# Patient Record
Sex: Male | Born: 1940 | Race: White | Hispanic: No | Marital: Married | Smoking: Never smoker
Health system: Southern US, Community
[De-identification: ages and names within clinical notes are randomized; demographics above are authoritative.]

## PROBLEM LIST (undated history)

## (undated) DIAGNOSIS — E119 Type 2 diabetes mellitus without complications: Secondary | ICD-10-CM

## (undated) DIAGNOSIS — F0390 Unspecified dementia without behavioral disturbance: Secondary | ICD-10-CM

## (undated) DIAGNOSIS — I10 Essential (primary) hypertension: Secondary | ICD-10-CM

---

## 2013-09-14 DEATH — deceased

## 2014-10-31 ENCOUNTER — Inpatient Hospital Stay: Payer: Self-pay | Admitting: Internal Medicine

## 2014-11-20 LAB — CULTURE, BLOOD (SINGLE)

## 2014-12-13 NOTE — Discharge Summary (Signed)
PATIENT NAME:  Luke Chen, Stephan MR#:  [id number] DATE OF BIRTH:  23-Jun-1941  DATE OF ADMISSION:  10/31/2014 DATE OF DISCHARGE:  11/01/2014  DISCHARGE DIAGNOSIS:  Pneumonia  ____________________________ Katha HammingSnehalatha Krystianna Soth, MD sk:AT D: 11/02/2014 18:23:28 ET T: 11/03/2014 03:50:15 ET JOB#: [id number]  cc: Katha HammingSnehalatha Noele Icenhour, MD, <Dictator> Katha HammingSNEHALATHA Maitland Muhlbauer MD ELECTRONICALLY SIGNED 11/05/2014 12:25

## 2014-12-13 NOTE — Discharge Summary (Signed)
PATIENT NAME:  Luke Chen, Luke Chen MR#:  [id number] DATE OF BIRTH:  19-Jan-1941  DATE OF ADMISSION:  10/31/2014 DATE OF DISCHARGE:  11/01/2014  DISCHARGE DIAGNOSES:  1.  Pneumonia.  2.  Fever and generalized weakness secondary to pneumonia.  3.  Type 2 diabetes mellitus. 4.  Hyperglycemia.   DISCHARGE MEDICATIONS:  1.  Levaquin 750 mg p.o. daily for 6 days. 2.  Metformin 500 mg p.o. b.i.d. 3.  Seroquel 25 mg p.o. daily.   CONSULTATIONS: None.   HOSPITAL COURSE: Patient is a 74 year old male patient brought in because of confusion. The patient has a history of diabetes before. He has been having cough, cold, and temperature at home, and yesterday his temperature was elevated at 101.3 Fahrenheit. Because of his weakness, fever, chills, brought in by EMS to the ER.  Patient's chest CAT scan showed some bilateral pneumonia. Admitted to hospitalist service for that.  His flu test has been negative. He was started on IV Levaquin. The patient also received IV fluids. The patient feels much better today. He has been afebrile and his white count has been normal at 8.5. He never spiked a white count. His kidney function is normal and the patient's CT chest showed some multifocal ground glass opacities concerning for infection. The patient felt better and wanted to go home today.  He is discharged home with Levaquin.   PHYSICAL EXAMINATION: DISCHARGE VITAL SIGNS:  Temperature is 97.5, heart rate 92, blood pressure 133/69, saturations 95% on room air.  CARDIOVASCULAR: S1, S2.  LUNGS: Clear to auscultation. No wheeze. No rales.  ABDOMEN: Soft, nontender, nondistended. Bowel sounds present.   DISPOSITION:  The patient is stable for discharge.  Time spent on discharge preparation more than 30 minutes.    ____________________________ Katha HammingSnehalatha Zeric Baranowski, MD sk:sp D: 11/01/2014 13:21:15 ET T: 11/01/2014 14:19:16 ET JOB#: [id number]  cc: Katha HammingSnehalatha Beyonce Sawatzky, MD, <Dictator> Katha HammingSNEHALATHA Rasheem Figiel  MD ELECTRONICALLY SIGNED 11/05/2014 12:25

## 2014-12-13 NOTE — H&P (Signed)
PATIENT NAME:  Luke Chen, Luke Chen MR#:  [id number] DATE OF BIRTH:  [birth year]  DATE OF ADMISSION:  10/31/2014  PRIMARY CARE PHYSICIAN: Marcine Matar., MD  CHIEF COMPLAINT: Brought in with confusion.   HISTORY OF PRESENT ILLNESS: This is a 74 year old man whose wife had a virus and a cold, now he caught the cold for the last few days. He has not been eating or drinking. He has been having a dry cough, low-grade fever, some chills, chest pain with the cough. The ER physician had been working him up, did a CAT scan of the chest that showed bilateral pneumonia. Also, the patient had a low-grade temperature and he was tachycardic despite giving a few liters of fluid. They were trying to send him home but when they stood him up he fell right back into the bed with weakness. He has been complaining of weakness also. Hospitalist services were contacted for further evaluation.   PAST MEDICAL HISTORY: Dementia and diabetes.   PAST SURGICAL HISTORY: Hernia.   ALLERGIES: No known drug allergies.   MEDICATIONS AT HOME: Include metformin 500 mg twice a day.   SOCIAL HISTORY: No smoking. No alcohol. No drug use. Used to work as a Advice worker. He lives with his wife.   FAMILY HISTORY: Father, unknown medical history. Mother died of a myocardial infarction.   REVIEW OF SYSTEMS:  CONSTITUTIONAL: Positive for fever. Positive for chills. Positive for sweats. Positive for weakness. No weight loss. No weight gain.  EYES: He does wear glasses.  EARS, NOSE, MOUTH, THROAT: No hearing loss. No sore throat. No difficulty swallowing.  CARDIOVASCULAR: Positive for chest pain with coughing.  RESPIRATORY: No shortness of breath. Positive for cough. No sputum. No hemoptysis.  GASTROINTESTINAL: No nausea. No vomiting. No abdominal pain. No diarrhea or constipation. No bright red blood per rectum. No melena.  GENITOURINARY: No burning on urination or hematuria.  MUSCULOSKELETAL: No joint pain or muscle pain.   INTEGUMENT: No rashes or eruptions.  NEUROLOGIC: No fainting or blackouts.  PSYCHIATRIC: No anxiety or depression.  ENDOCRINE: No thyroid problems.  HEMATOLOGIC AND LYMPHATIC: No anemia, no easy bruising or bleeding.   PHYSICAL EXAMINATION:  VITAL SIGNS: Temperature 100.2, pulse 105, respirations 22, blood pressure 145/80, pulse oximetry 98% on room air.  GENERAL: No respiratory distress.  EYES: Conjunctivae and lids normal. Pupils equal, round, and reactive to light. Extraocular muscles intact. No nystagmus.  EARS, NOSE, MOUTH AND THROAT: Tympanic membranes no erythema. Nasal mucosa no erythema. Throat no erythema. No exudate seen. Lips and gums: No lesions.  NECK: No JVD. No bruits. No lymphadenopathy. No thyromegaly. No thyroid nodules palpated.  RESPIRATORY: Decreased breath sounds bilaterally. Positive rhonchi bilateral bases. No wheeze or rales heard.  CARDIOVASCULAR: S1 and S2 tachycardic. No gallops, rubs, or murmurs heard. Carotid upstroke 2+ bilaterally. No bruits. Dorsalis pedis pulses 1+ bilaterally. No edema of the lower extremity.  ABDOMEN: Soft, nontender. No organomegaly/splenomegaly. Normoactive bowel sounds. No masses felt.  LYMPHATIC: No lymph nodes in the neck.  MUSCULOSKELETAL: No clubbing, edema, cyanosis.  SKIN: No ulcers or lesions seen.  NEUROLOGIC: Cranial nerves II-XII grossly intact. Deep tendon reflexes 2+ bilateral lower extremity.  PSYCHIATRIC: The patient is alert, oriented to person.   LABORATORY AND RADIOLOGICAL DATA: Chemistry showed a glucose of 336, BUN 18, creatinine 1.07, sodium 134, potassium 4.1, chloride 99, CO2 of 21, calcium 8.8. Liver function tests normal range. White blood cell count 9.6, hemoglobin and hematocrit 15.9 and 48.1, platelet count of 156,000. Troponin  negative. Lactic acid 1.8. Urinalysis 2+ blood, 2+ ketones, greater than 500 mg/dL of glucose. CT scan of the head showed no evidence of acute intracranial abnormality, bilateral ethmoid  air cell opacification could reflect acute and/or chronic sinusitis. Chest x-ray showed right basilar opacity likely atelectasis. Influenza test negative. CT scan of the chest showed multifocal ground-glass and airspace opacities bilaterally, most likely representing infection. EKG sinus tachycardia at 105 beats per minute, Q waves septally.   ASSESSMENT AND PLAN:  1.  Sepsis, bilateral pneumonia with fever and tachycardia. Intravenous Levaquin given in the Emergency Room. We will give intravenous fluid hydration and nebulizer treatments.  2.  Acute encephalopathy, likely sundowning with dementia. We will give Seroquel at night and a sitter while here. Dementia can get worse here in the hospital.  3.  Diabetes, uncontrolled. We will put on sliding scale. Continue Glucophage and check a hemoglobin A1c in the a.m.  4.  Chest pain with coughing, likely secondary to the pneumonia. We will hold off on further cardiac testing at this time.   CODE STATUS: Needs to be clarified tomorrow with the wife. I think the patient was too confused answer the question at this point. The family member that is in the room did not want me to call the wife because she is also sick and she was unable to answer the question   TIME SPENT ON ADMISSION: 50 minutes.    ____________________________ Cloteal Isaacson J. Renae GlossWieting, MD rjw:bm D: 10/31/2014 21:49:34 ET Herschell Dimes: 10/31/2014 22:02:07 ET JOB#: [id number]  cc: Herschell Dimesichard J. Renae GlossWieting, MD, <Dictator> Marcine Matarharles W. Phillips Jr., MD Salley ScarletICHARD J Masako Overall MD ELECTRONICALLY SIGNED 11/01/2014 13:32

## 2015-12-17 DIAGNOSIS — R42 Dizziness and giddiness: Secondary | ICD-10-CM | POA: Diagnosis not present

## 2015-12-17 DIAGNOSIS — Z760 Encounter for issue of repeat prescription: Secondary | ICD-10-CM | POA: Diagnosis not present

## 2016-01-27 IMAGING — CR DG CHEST 1V PORT
1 series · 1 of 1 positions shown · non-contrast
Comparison: None.

CLINICAL DATA: Hyperglycemic

EXAM:
PORTABLE CHEST - 1 VIEW

[ap]
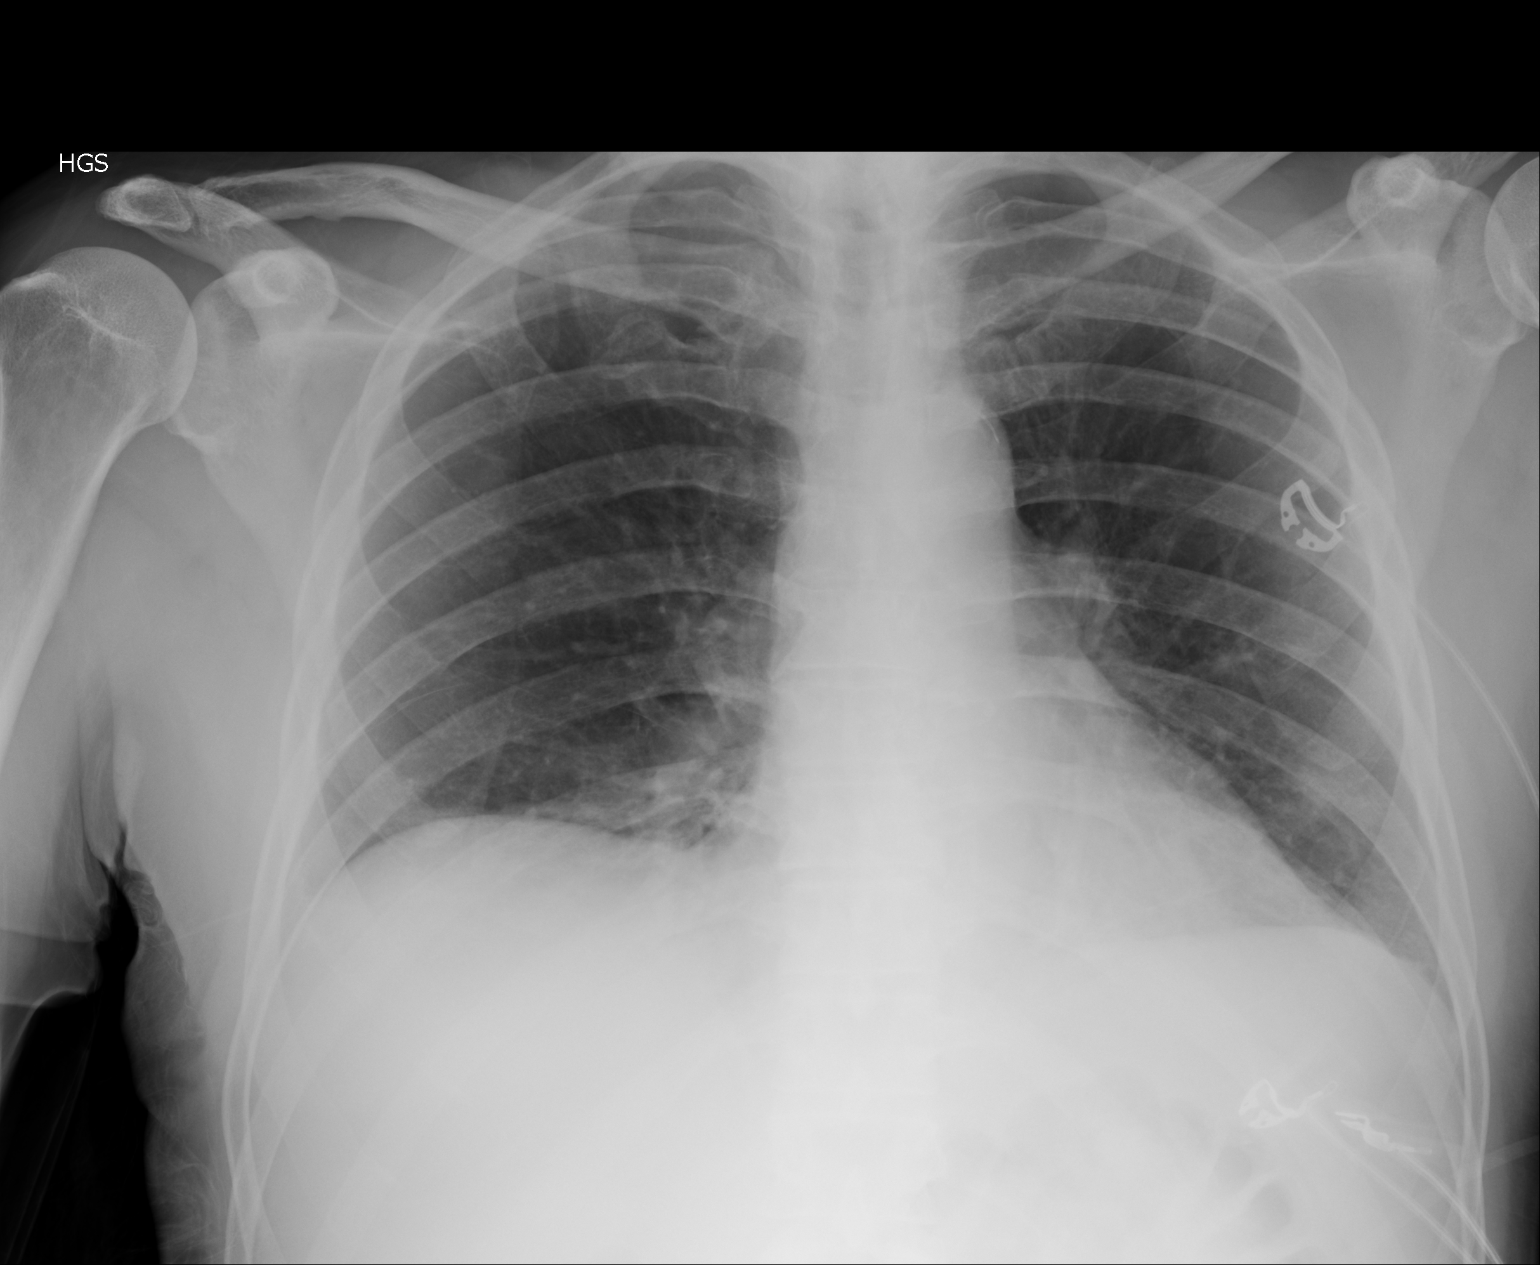

[1 of 1 positions shown; findings below may reference images not displayed]

FINDINGS: Lungs are essentially clear. Mild right basilar opacity, likely
atelectasis. No pleural effusion or pneumothorax.

The heart is normal in size.
IMPRESSION: Mild right basilar opacity, likely atelectasis.

## 2016-01-27 IMAGING — CT CT HEAD WITHOUT CONTRAST
1 series · 16 of 30 positions shown, 20 images · non-contrast
Comparison: None.

CLINICAL DATA: Altered level of consciousness, sluggish pupils.

EXAM:
CT HEAD WITHOUT CONTRAST
TECHNIQUE: Contiguous axial images were obtained from the base of the skull
through the vertex without intravenous contrast.

[Series 2: head wo · axial · 0.43mm/px · z∈[+468,+594]mm · 16 of 32 slices shown, 20 images]
[im 2/32  brain]
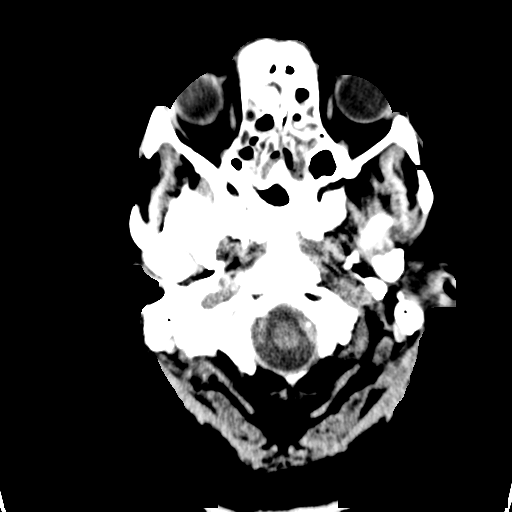
[im 2/32  bone]
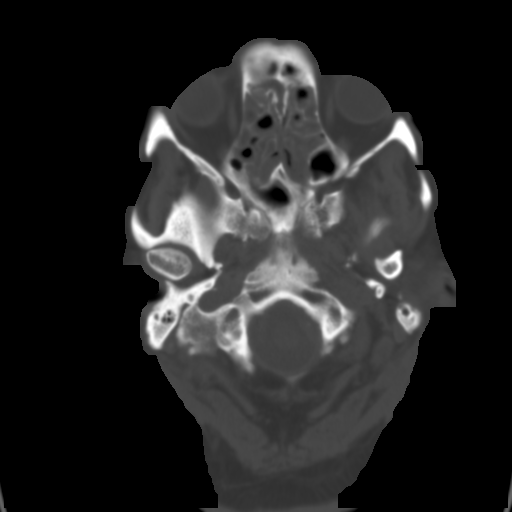
[im 4/32  brain]
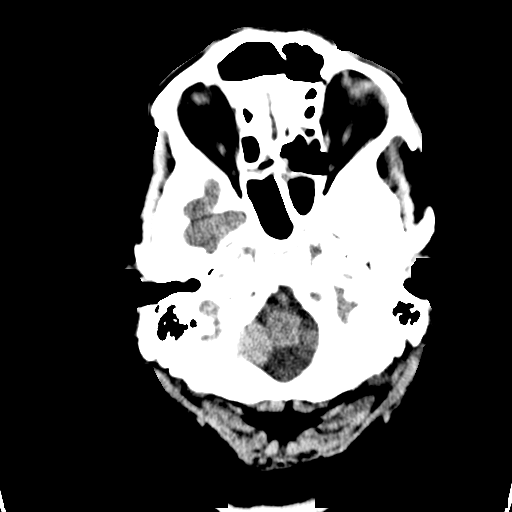
[im 6/32  brain]
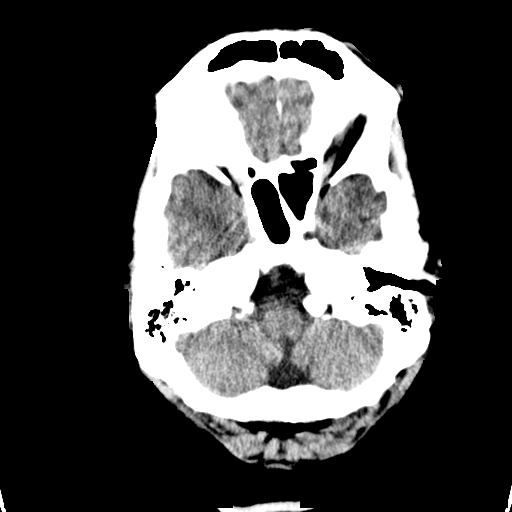
[im 8/32  brain]
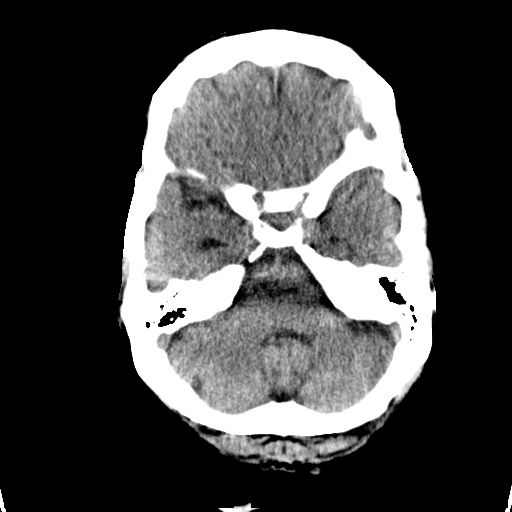
[im 9/32  brain]
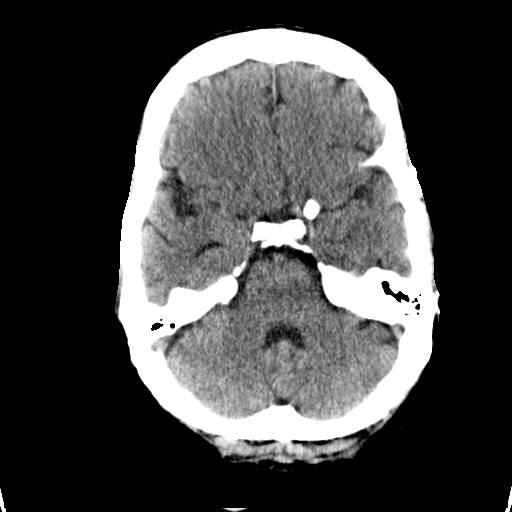
[im 9/32  bone]
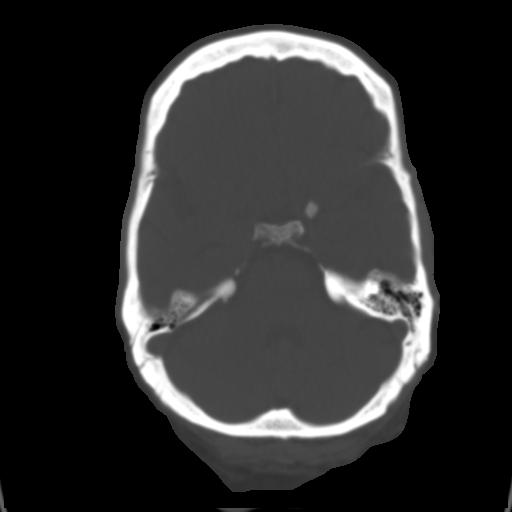
[im 11/32  brain]
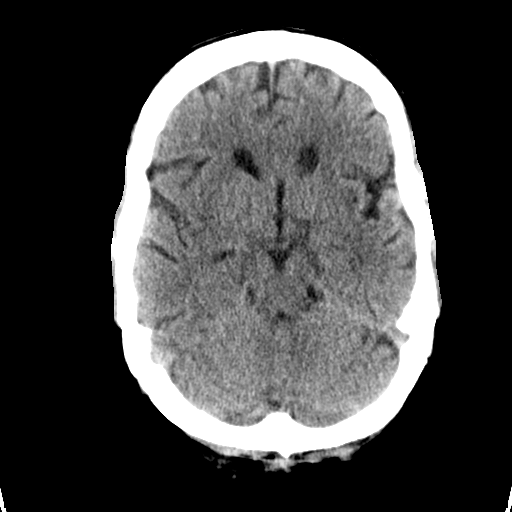
[im 13/32  brain]
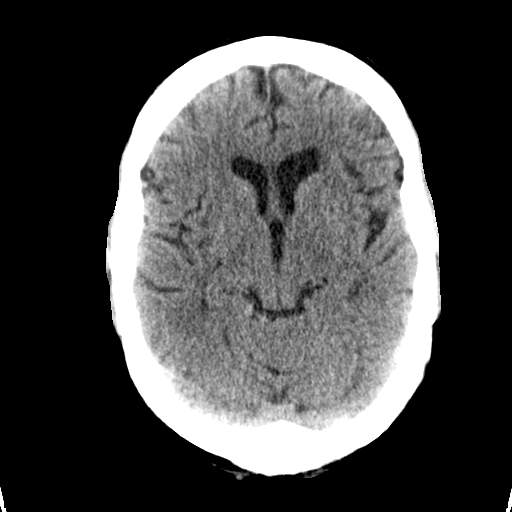
[im 15/32  brain]
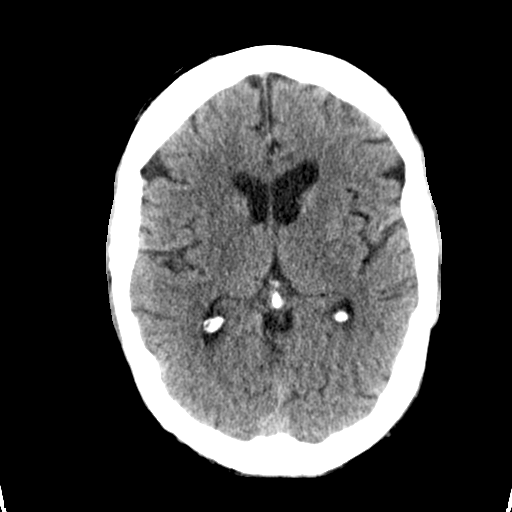
[im 17/32  brain]
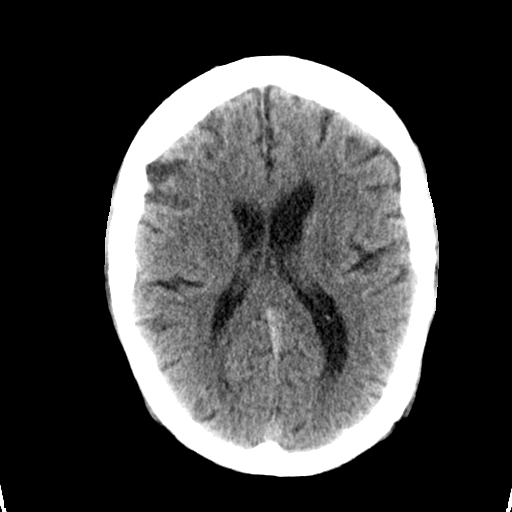
[im 17/32  bone]
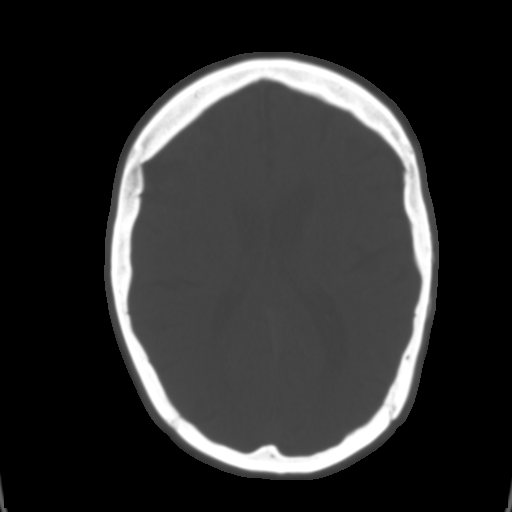
[im 19/32  brain]
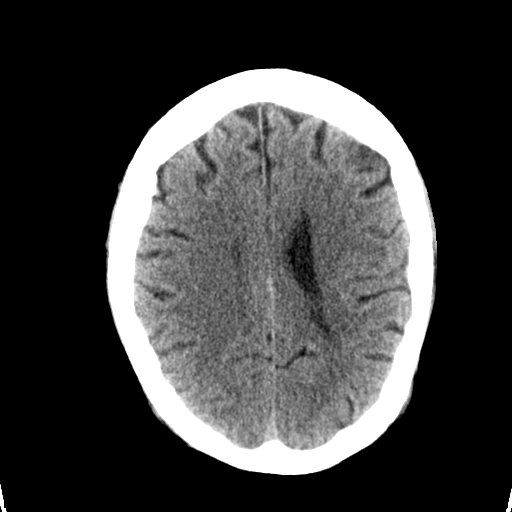
[im 21/32  brain]
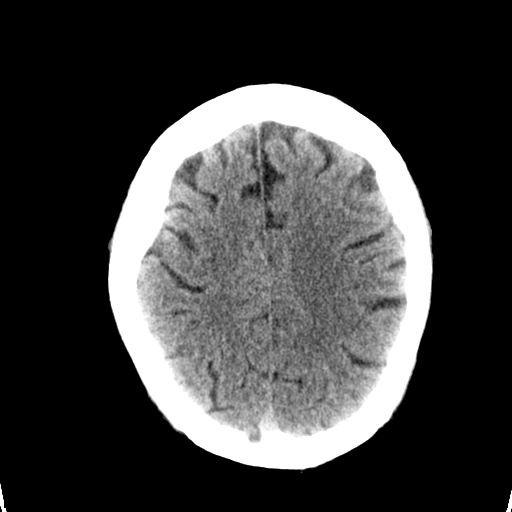
[im 23/32  brain]
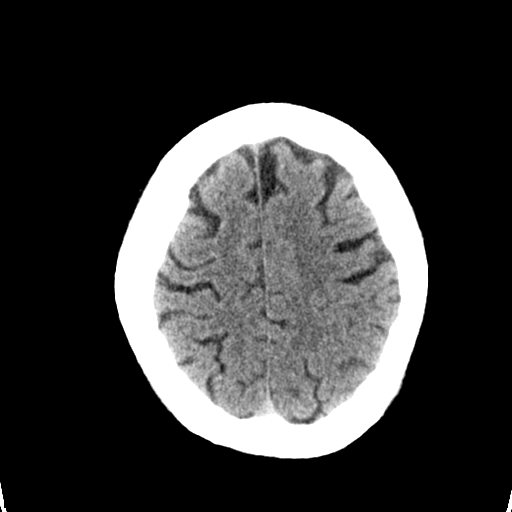
[im 24/32  brain]
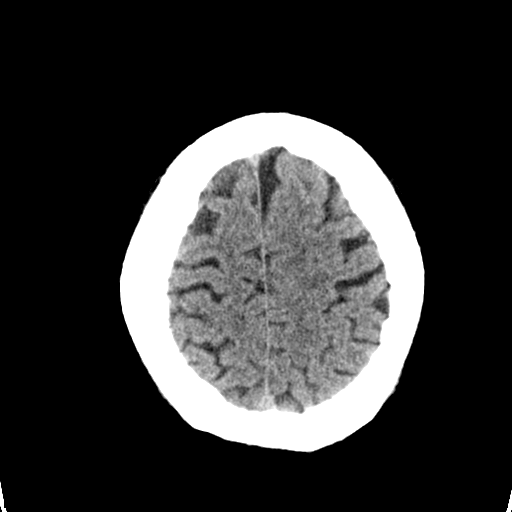
[im 24/32  bone]
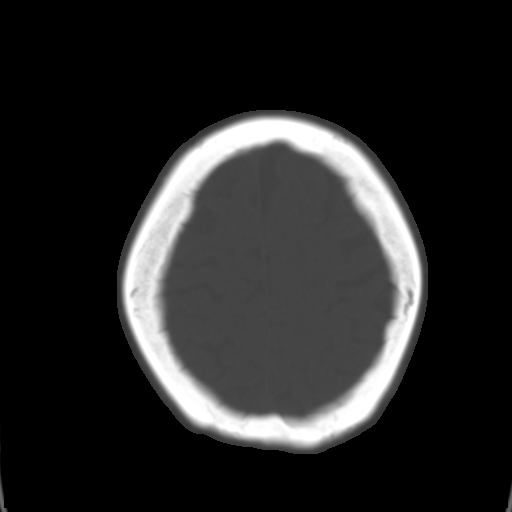
[im 26/32  brain]
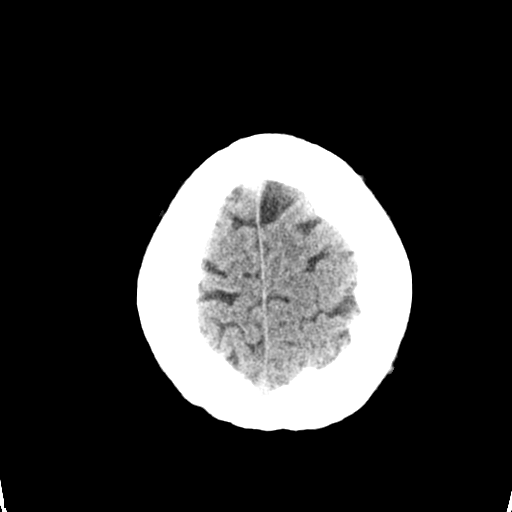
[im 28/32  brain]
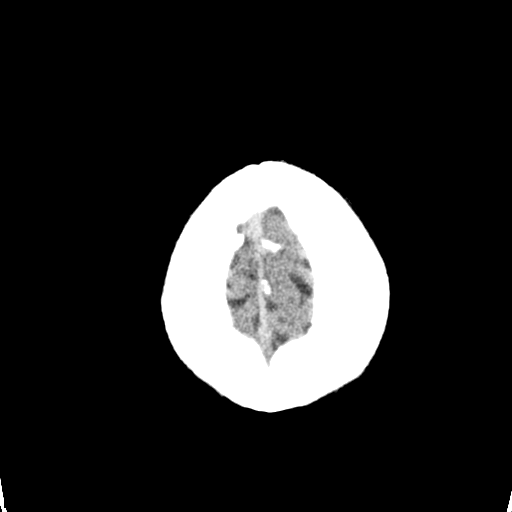
[im 30/32  brain]
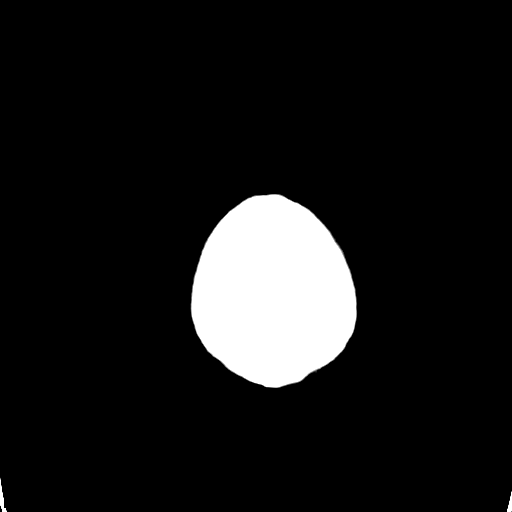

[16 of 30 positions shown; findings below may reference images not displayed]

FINDINGS: Mild subcortical and periventricular white matter hypodensities, a
nonspecific finding most often seen in the setting of chronic
microangiopathic change. No CT evidence of an acute infarction. No
intraparenchymal hemorrhage, mass, mass effect, or abnormal
extra-axial fluid collection. The ventricles, cisterns, and sulci
are normal in size, shape, and position for age. Bilateral ethmoid
air cell opacification and mucosal thickening. Mucosal thickening of
the sphenoid chambers and frontal sinuses inferiorly.
IMPRESSION: Mild white matter changes as above. No CT evidence of an acute
intracranial abnormality.

Bilateral ethmoid air cell opacification reflect acute and/or
chronic sinusitis. Correlate for clinical symptoms.

## 2016-07-18 DIAGNOSIS — E119 Type 2 diabetes mellitus without complications: Secondary | ICD-10-CM | POA: Diagnosis not present

## 2016-09-15 DIAGNOSIS — G309 Alzheimer's disease, unspecified: Secondary | ICD-10-CM | POA: Diagnosis not present

## 2016-09-15 DIAGNOSIS — F0280 Dementia in other diseases classified elsewhere without behavioral disturbance: Secondary | ICD-10-CM | POA: Diagnosis not present

## 2016-09-15 DIAGNOSIS — E119 Type 2 diabetes mellitus without complications: Secondary | ICD-10-CM | POA: Diagnosis not present

## 2016-09-15 DIAGNOSIS — J219 Acute bronchiolitis, unspecified: Secondary | ICD-10-CM | POA: Diagnosis not present

## 2016-09-29 DIAGNOSIS — E1169 Type 2 diabetes mellitus with other specified complication: Secondary | ICD-10-CM | POA: Diagnosis not present

## 2016-09-29 DIAGNOSIS — F0280 Dementia in other diseases classified elsewhere without behavioral disturbance: Secondary | ICD-10-CM | POA: Diagnosis not present

## 2016-09-29 DIAGNOSIS — G309 Alzheimer's disease, unspecified: Secondary | ICD-10-CM | POA: Diagnosis not present

## 2016-09-29 DIAGNOSIS — J219 Acute bronchiolitis, unspecified: Secondary | ICD-10-CM | POA: Diagnosis not present

## 2016-10-17 DIAGNOSIS — Z125 Encounter for screening for malignant neoplasm of prostate: Secondary | ICD-10-CM | POA: Diagnosis not present

## 2016-10-17 DIAGNOSIS — R5381 Other malaise: Secondary | ICD-10-CM | POA: Diagnosis not present

## 2016-10-17 DIAGNOSIS — E784 Other hyperlipidemia: Secondary | ICD-10-CM | POA: Diagnosis not present

## 2016-10-17 DIAGNOSIS — E119 Type 2 diabetes mellitus without complications: Secondary | ICD-10-CM | POA: Diagnosis not present

## 2016-10-17 DIAGNOSIS — I10 Essential (primary) hypertension: Secondary | ICD-10-CM | POA: Diagnosis not present

## 2016-10-23 DIAGNOSIS — F0280 Dementia in other diseases classified elsewhere without behavioral disturbance: Secondary | ICD-10-CM | POA: Diagnosis not present

## 2016-10-23 DIAGNOSIS — J219 Acute bronchiolitis, unspecified: Secondary | ICD-10-CM | POA: Diagnosis not present

## 2016-10-23 DIAGNOSIS — G309 Alzheimer's disease, unspecified: Secondary | ICD-10-CM | POA: Diagnosis not present

## 2016-10-23 DIAGNOSIS — E1169 Type 2 diabetes mellitus with other specified complication: Secondary | ICD-10-CM | POA: Diagnosis not present

## 2016-12-25 DIAGNOSIS — G309 Alzheimer's disease, unspecified: Secondary | ICD-10-CM | POA: Diagnosis not present

## 2016-12-25 DIAGNOSIS — F0280 Dementia in other diseases classified elsewhere without behavioral disturbance: Secondary | ICD-10-CM | POA: Diagnosis not present

## 2016-12-25 DIAGNOSIS — E119 Type 2 diabetes mellitus without complications: Secondary | ICD-10-CM | POA: Diagnosis not present

## 2016-12-25 DIAGNOSIS — J219 Acute bronchiolitis, unspecified: Secondary | ICD-10-CM | POA: Diagnosis not present

## 2017-04-05 DIAGNOSIS — E889 Metabolic disorder, unspecified: Secondary | ICD-10-CM | POA: Diagnosis not present

## 2017-04-05 DIAGNOSIS — E119 Type 2 diabetes mellitus without complications: Secondary | ICD-10-CM | POA: Diagnosis not present

## 2017-04-05 DIAGNOSIS — E1169 Type 2 diabetes mellitus with other specified complication: Secondary | ICD-10-CM | POA: Diagnosis not present

## 2017-04-05 DIAGNOSIS — G309 Alzheimer's disease, unspecified: Secondary | ICD-10-CM | POA: Diagnosis not present

## 2017-05-16 ENCOUNTER — Encounter: Payer: Self-pay | Admitting: Emergency Medicine

## 2017-05-16 ENCOUNTER — Emergency Department: Payer: Medicare Other

## 2017-05-16 ENCOUNTER — Emergency Department
Admission: EM | Admit: 2017-05-16 | Discharge: 2017-05-16 | Disposition: A | Discharge status: Home / Self Care | Payer: Medicare Other | Attending: Emergency Medicine | Admitting: Emergency Medicine

## 2017-05-16 DIAGNOSIS — I10 Essential (primary) hypertension: Secondary | ICD-10-CM | POA: Insufficient documentation

## 2017-05-16 DIAGNOSIS — R112 Nausea with vomiting, unspecified: Secondary | ICD-10-CM | POA: Diagnosis not present

## 2017-05-16 DIAGNOSIS — F0390 Unspecified dementia without behavioral disturbance: Secondary | ICD-10-CM | POA: Diagnosis not present

## 2017-05-16 DIAGNOSIS — N200 Calculus of kidney: Secondary | ICD-10-CM | POA: Insufficient documentation

## 2017-05-16 DIAGNOSIS — R1031 Right lower quadrant pain: Secondary | ICD-10-CM | POA: Diagnosis present

## 2017-05-16 DIAGNOSIS — N201 Calculus of ureter: Secondary | ICD-10-CM | POA: Diagnosis not present

## 2017-05-16 DIAGNOSIS — E119 Type 2 diabetes mellitus without complications: Secondary | ICD-10-CM | POA: Diagnosis not present

## 2017-05-16 DIAGNOSIS — R001 Bradycardia, unspecified: Secondary | ICD-10-CM | POA: Diagnosis not present

## 2017-05-16 HISTORY — DX: Essential (primary) hypertension: I10

## 2017-05-16 HISTORY — DX: Unspecified dementia, unspecified severity, without behavioral disturbance, psychotic disturbance, mood disturbance, and anxiety: F03.90

## 2017-05-16 HISTORY — DX: Type 2 diabetes mellitus without complications: E11.9

## 2017-05-16 LAB — COMPREHENSIVE METABOLIC PANEL
ALT: 16 U/L — AB (ref 17–63)
AST: 23 U/L (ref 15–41)
Albumin: 4.4 g/dL (ref 3.5–5.0)
Alkaline Phosphatase: 59 U/L (ref 38–126)
Anion gap: 11 (ref 5–15)
BUN: 17 mg/dL (ref 6–20)
CHLORIDE: 99 mmol/L — AB (ref 101–111)
CO2: 27 mmol/L (ref 22–32)
CREATININE: 1.28 mg/dL — AB (ref 0.61–1.24)
Calcium: 9.1 mg/dL (ref 8.9–10.3)
GFR calc Af Amer: >60 mL/min (ref >60)
GFR calc non Af Amer: 54 mL/min — ABNORMAL LOW (ref >60)
Glucose, Bld: 204 mg/dL — ABNORMAL HIGH (ref 65–99)
POTASSIUM: 4 mmol/L (ref 3.5–5.1)
SODIUM: 137 mmol/L (ref 135–145)
Total Bilirubin: 1 mg/dL (ref 0.3–1.2)
Total Protein: 6.9 g/dL (ref 6.5–8.1)

## 2017-05-16 LAB — URINALYSIS, COMPLETE (UACMP) WITH MICROSCOPIC
SQUAMOUS EPITHELIAL / LPF: NONE SEEN
Specific Gravity, Urine: 1.031 — ABNORMAL HIGH (ref 1.005–1.030)

## 2017-05-16 LAB — CBC
HEMATOCRIT: 46.8 % (ref 40.0–52.0)
Hemoglobin: 15.8 g/dL (ref 13.0–18.0)
MCH: 30.9 pg (ref 26.0–34.0)
MCHC: 33.8 g/dL (ref 32.0–36.0)
MCV: 91.3 fL (ref 80.0–100.0)
PLATELETS: 185 10*3/uL (ref 150–440)
RBC: 5.12 MIL/uL (ref 4.40–5.90)
RDW: 13.7 % (ref 11.5–14.5)
WBC: 15 10*3/uL — AB (ref 3.8–10.6)

## 2017-05-16 LAB — LIPASE, BLOOD: LIPASE: 17 U/L (ref 11–51)

## 2017-05-16 MED ORDER — ACETAMINOPHEN 500 MG PO TABS
1000.0000 mg | ORAL_TABLET | Freq: Once | ORAL | Status: AC
  Administered 2017-05-16: 1000 mg via ORAL
  Filled 2017-05-16: qty 2

## 2017-05-16 MED ORDER — TAMSULOSIN HCL 0.4 MG PO CAPS: 0.4000 mg | ORAL_CAPSULE | Freq: Every day | ORAL | 0 refills | Status: AC

## 2017-05-16 MED ORDER — OXYCODONE-ACETAMINOPHEN 5-325 MG PO TABS: 1.0000 | ORAL_TABLET | Freq: Four times a day (QID) | ORAL | 0 refills | Status: AC | PRN

## 2017-05-16 MED ORDER — TAMSULOSIN HCL 0.4 MG PO CAPS
0.4000 mg | ORAL_CAPSULE | Freq: Once | ORAL | Status: AC
  Administered 2017-05-16: 0.4 mg via ORAL
  Filled 2017-05-16: qty 1

## 2017-05-16 MED ORDER — CEPHALEXIN 500 MG PO CAPS: 500.0000 mg | ORAL_CAPSULE | Freq: Three times a day (TID) | ORAL | 0 refills | Status: AC

## 2017-05-16 MED ORDER — ONDANSETRON 4 MG PO TBDP: 4.0000 mg | ORAL_TABLET | Freq: Three times a day (TID) | ORAL | 0 refills | Status: DC | PRN

## 2017-05-16 MED ORDER — CEPHALEXIN 500 MG PO CAPS
500.0000 mg | ORAL_CAPSULE | Freq: Once | ORAL | Status: AC
  Administered 2017-05-16: 500 mg via ORAL
  Filled 2017-05-16: qty 1

## 2017-05-16 NOTE — ED Triage Notes (Signed)
Pt brought over from [facility] for evaluation of right flank pain that radiates to abdomen that began last night. Reports vomited last night but none since. Pt wife reports he has history of kidney stone.

## 2017-05-16 NOTE — Discharge Instructions (Signed)
You have been seen in the Emergency Department (ED)  Today and was diagnosed with kidney stones. While the stone is traveling through the ureter, which is the tube that carries urine from the kidney to the bladder, you will probably feel pain. The pain may be mild or very severe. You may also have some blood in your urine. As soon as the stone reaches the bladder, any intense pain should go away. If a stone is too large to pass on its own, you may need a medical procedure to help you pass the stone.   As we have discussed, please drink plenty of fluids and use a urinary strainer to attempt to capture the stone.  Please make a follow up appointment with Urology in the next week by calling the number below and bring the stone with you.  Take one percocet every 6 hours for pain. Do not take tylenol while taking percocet. Please also take your prescribed flomax daily.   Follow-up with your doctor or return to the ER in 12-24 hours if your pain is not well controlled, if you develop pain or burning with urination, or if you develop a fever. Otherwise follow up in 3-5 days with your doctor.  When should you call for help?  Call your doctor now or seek immediate medical care if:  You cannot keep down fluids.  Your pain gets worse.  You have a fever or chills.  You have new or worse pain in your back just below your rib cage (the flank area).  You have new or more blood in your urine. You have pain or burning with urination You are unable to urinate You have abdominal pain  Watch closely for changes in your health, and be sure to contact your doctor if:  You do not get better as expected  How can you care for yourself at home?  Drink plenty of fluids, enough so that your urine is light yellow or clear like water. If you have kidney, heart, or liver disease and have to limit fluids, talk with your doctor before you increase the amount of fluids you drink.  Take pain medicines exactly as directed. Call  your doctor if you think you are having a problem with your medicine.  If the doctor gave you a prescription medicine for pain, take it as prescribed.  If you are not taking a prescription pain medicine, ask your doctor if you can take an over-the-counter medicine. Read and follow all instructions on the label. Your doctor may ask you to strain your urine so that you can collect your kidney stone when it passes. You can use a kitchen strainer or a tea strainer to catch the stone. Store it in a plastic bag until you see your doctor again.  Preventing future kidney stones  Some changes in your diet may help prevent kidney stones. Depending on the cause of your stones, your doctor may recommend that you:  Drink plenty of fluids, enough so that your urine is light yellow or clear like water. If you have kidney, heart, or liver disease and have to limit fluids, talk with your doctor before you increase the amount of fluids you drink.  Limit coffee, tea, and alcohol. Also avoid grapefruit juice.  Do not take more than the recommended daily dose of vitamins C and D.  Avoid antacids such as Gaviscon, Maalox, Mylanta, or Tums.  Limit the amount of salt (sodium) in your diet.  Eat a balanced diet that  is not too high in protein.  Limit foods that are high in a substance called oxalate, which can cause kidney stones. These foods include dark green vegetables, rhubarb, chocolate, wheat bran, nuts, cranberries, and beans.

## 2017-05-16 NOTE — ED Provider Notes (Signed)
[facility] Emergency Department Provider Note  ____________________________________________  Time seen: Approximately 6:36 PM  I have reviewed the triage vital signs and the nursing notes.   HISTORY  Chief Complaint Flank Pain and Abdominal Pain   HPI Wayland Baik is a 76 y.o. male with a history of dementia, diabetes, hypertension, kidney stones and presents for evaluation of right flank pain. Pain started yesterday afternoon, sharp, intermittent, severe at times, initially on the right flank now in his right lower abdomen, associated with nausea and several episodes of nonbloody nonbilious emesis. No dysuria, hematuria, fever, chills. Patient denies any prior abdominal surgeries other than umbilical hernia repair. Patient's current pain is 3 out of 10.no chest pain, no shortness of breath, no paresthesias or weakness of his extremities.  Past Medical History:  Diagnosis Date  . Dementia   . Diabetes mellitus without complication (HCC)   . Hypertension     There are no active problems to display for this patient.   No past surgical history on file.  Prior to Admission medications   Medication Sig Start Date End Date Taking? Authorizing Provider  cephALEXin (KEFLEX) 500 MG capsule Take 1 capsule (500 mg total) by mouth 3 (three) times daily. 05/16/17 05/23/17  Nita Sickle, MD  ondansetron (ZOFRAN ODT) 4 MG disintegrating tablet Take 1 tablet (4 mg total) by mouth every 8 (eight) hours as needed for nausea or vomiting. 05/16/17   Nita Sickle, MD  oxyCODONE-acetaminophen (ROXICET) 5-325 MG tablet Take 1 tablet by mouth every 6 (six) hours as needed. 05/16/17 05/16/18  Nita Sickle, MD  tamsulosin (FLOMAX) 0.4 MG CAPS capsule Take 1 capsule (0.4 mg total) by mouth daily. 05/16/17 05/23/17  Nita Sickle, MD    Allergies Patient has no known allergies.  No family history on file.  Social History Social History  Substance Use  Topics  . Smoking status: Not on file  . Smokeless tobacco: Not on file  . Alcohol use Not on file    Review of Systems  Constitutional: Negative for fever. Eyes: Negative for visual changes. ENT: Negative for sore throat. Neck: No neck pain  Cardiovascular: Negative for chest pain. Respiratory: Negative for shortness of breath. Gastrointestinal: Negative for abdominal pain, vomiting or diarrhea. Genitourinary: Negative for dysuria. + R flank pain Musculoskeletal: Negative for back pain. Skin: Negative for rash. Neurological: Negative for headaches, weakness or numbness. Psych: No SI or HI  ____________________________________________   PHYSICAL EXAM:  VITAL SIGNS: ED Triage Vitals  Enc Vitals Group     BP 05/16/17 1648 (!) 147/56     Pulse Rate 05/16/17 1648 64     Resp 05/16/17 1648 18     Temp 05/16/17 1648 98.1 F (36.7 C)     Temp Source 05/16/17 1648 Oral     SpO2 05/16/17 1648 97 %     Weight 05/16/17 1649 160 lb (72.6 kg)     Height 05/16/17 1649  (1.753 m)     Head Circumference --      Peak Flow --      Pain Score --      Pain Loc --      Pain Edu? --      Excl. in GC? --     Constitutional: Alert and oriented. Well appearing and in no apparent distress. HEENT:      Head: Normocephalic and atraumatic.         Eyes: Conjunctivae are normal. Sclera is non-icteric.  Mouth/Throat: Mucous membranes are moist.       Neck: Supple with no signs of meningismus. Cardiovascular: Regular rate and rhythm. No murmurs, gallops, or rubs. 2+ symmetrical distal pulses are present in all extremities. No JVD. Respiratory: Normal respiratory effort. Lungs are clear to auscultation bilaterally. No wheezes, crackles, or rhonchi.  Gastrointestinal: Soft, non tender, and non distended with positive bowel sounds. No rebound or guarding. Genitourinary: No CVA tenderness. Musculoskeletal: Nontender with normal range of motion in all extremities. No edema, cyanosis, or  erythema of extremities. Neurologic: Normal speech and language. Face is symmetric. Moving all extremities. No gross focal neurologic deficits are appreciated. Skin: Skin is warm, dry and intact. No rash noted. Psychiatric: Mood and affect are normal. Speech and behavior are normal.  ____________________________________________   LABS (all labs ordered are listed, but only abnormal results are displayed)  Labs Reviewed  COMPREHENSIVE METABOLIC PANEL - Abnormal; Notable for the following:       Result Value   Chloride 99 (*)    Glucose, Bld 204 (*)    Creatinine, Ser 1.28 (*)    ALT 16 (*)    GFR calc non Af Amer 54 (*)    All other components within normal limits  CBC - Abnormal; Notable for the following:    WBC 15.0 (*)    All other components within normal limits  URINALYSIS, COMPLETE (UACMP) WITH MICROSCOPIC - Abnormal; Notable for the following:    Color, Urine ORANGE (*)    Specific Gravity, Urine 1.031 (*)    Glucose, UA   (*)    Value: TEST NOT REPORTED DUE TO COLOR INTERFERENCE OF URINE PIGMENT   Hgb urine dipstick   (*)    Value: TEST NOT REPORTED DUE TO COLOR INTERFERENCE OF URINE PIGMENT   Bilirubin Urine   (*)    Value: TEST NOT REPORTED DUE TO COLOR INTERFERENCE OF URINE PIGMENT   Ketones, ur   (*)    Value: TEST NOT REPORTED DUE TO COLOR INTERFERENCE OF URINE PIGMENT   Protein, ur   (*)    Value: TEST NOT REPORTED DUE TO COLOR INTERFERENCE OF URINE PIGMENT   Nitrite   (*)    Value: TEST NOT REPORTED DUE TO COLOR INTERFERENCE OF URINE PIGMENT   Leukocytes, UA   (*)    Value: TEST NOT REPORTED DUE TO COLOR INTERFERENCE OF URINE PIGMENT   Bacteria, UA RARE (*)    All other components within normal limits  URINE CULTURE  LIPASE, BLOOD   ____________________________________________  EKG  ED ECG REPORT I, Nita Sickle, the attending physician, personally viewed and interpreted this ECG.  sinus bradycardia, rate of 59, normal intervals, normal axis, no  ST elevations or depressions, Q-wave on anterior leads. no prior for comparison. ____________________________________________  RADIOLOGY  CT renal:  Stable subcentimeter right lower lobe pulmonary nodule supporting benign etiology. Bibasilar ground-glass opacities have markedly improved supporting non neoplastic etiology such as an inflammatory process 3 mm proximal right ureteral calculus is associated with right hydronephrosis. Right nephrolithiasis. Left inguinal hernia contains adipose tissue. Aortic atherosclerosis. ____________________________________________   PROCEDURES  Procedure(s) performed: None Procedures Critical Care performed:  None ____________________________________________   INITIAL IMPRESSION / ASSESSMENT AND PLAN / ED COURSE   76 y.o. male with a history of dementia, diabetes, hypertension, kidney stones who presents for evaluation of right flank pain x 1 day associated with nausea and vomiting. Patient is extremely well appearing, no distress, is normal vital signs, he has no flank  tenderness, abdomen is soft with no tenderness, no pulsatile masses, strong distal pulses. EKG with no ischemic changes. CT renal protocol showing a 3 mm proximal right ureteral stone with right hydronephrosis. Patient's pain has completely resolved with the Tylenol. UA is limited due color interference but no WBCs and rare bacteria. Ucx pending. Since patient has leukocytosis, inconclusive UA and proximal stone will start patient on abx and send home on keflex. Close follow up with Urology. Recommended return precautions for dysuria, worsening pain, fever, nausea, and vomiting.      Pertinent labs & imaging results that were available during my care of the patient were reviewed by me and considered in my medical decision making (see chart for details).    ____________________________________________   FINAL CLINICAL IMPRESSION(S) / ED DIAGNOSES  Final diagnoses:  Kidney  stone      NEW MEDICATIONS STARTED DURING THIS VISIT:  New Prescriptions   CEPHALEXIN (KEFLEX) 500 MG CAPSULE    Take 1 capsule (500 mg total) by mouth 3 (three) times daily.   ONDANSETRON (ZOFRAN ODT) 4 MG DISINTEGRATING TABLET    Take 1 tablet (4 mg total) by mouth every 8 (eight) hours as needed for nausea or vomiting.   OXYCODONE-ACETAMINOPHEN (ROXICET) 5-325 MG TABLET    Take 1 tablet by mouth every 6 (six) hours as needed.   TAMSULOSIN (FLOMAX) 0.4 MG CAPS CAPSULE    Take 1 capsule (0.4 mg total) by mouth daily.     Note:  This document was prepared using Dragon voice recognition software and may include unintentional dictation errors.    Nita Sickle, MD 05/16/17 [id number]

## 2017-05-18 LAB — URINE CULTURE: Culture: NO GROWTH

## 2017-07-03 DIAGNOSIS — G309 Alzheimer's disease, unspecified: Secondary | ICD-10-CM | POA: Diagnosis not present

## 2017-07-03 DIAGNOSIS — E1169 Type 2 diabetes mellitus with other specified complication: Secondary | ICD-10-CM | POA: Diagnosis not present

## 2017-07-03 DIAGNOSIS — E889 Metabolic disorder, unspecified: Secondary | ICD-10-CM | POA: Diagnosis not present

## 2017-07-03 DIAGNOSIS — E119 Type 2 diabetes mellitus without complications: Secondary | ICD-10-CM | POA: Diagnosis not present

## 2017-09-26 DIAGNOSIS — R0789 Other chest pain: Secondary | ICD-10-CM | POA: Diagnosis not present

## 2017-09-26 DIAGNOSIS — R21 Rash and other nonspecific skin eruption: Secondary | ICD-10-CM | POA: Diagnosis not present

## 2017-09-26 DIAGNOSIS — R000 Tachycardia, unspecified: Secondary | ICD-10-CM | POA: Diagnosis not present

## 2017-09-26 DIAGNOSIS — R531 Weakness: Secondary | ICD-10-CM | POA: Diagnosis not present

## 2017-09-26 DIAGNOSIS — R079 Chest pain, unspecified: Secondary | ICD-10-CM | POA: Diagnosis not present

## 2017-09-27 DIAGNOSIS — R21 Rash and other nonspecific skin eruption: Secondary | ICD-10-CM | POA: Diagnosis not present

## 2017-09-27 DIAGNOSIS — R0789 Other chest pain: Secondary | ICD-10-CM | POA: Diagnosis not present

## 2017-10-30 DIAGNOSIS — F0280 Dementia in other diseases classified elsewhere without behavioral disturbance: Secondary | ICD-10-CM | POA: Diagnosis not present

## 2017-10-30 DIAGNOSIS — B0223 Postherpetic polyneuropathy: Secondary | ICD-10-CM | POA: Diagnosis not present

## 2017-10-30 DIAGNOSIS — E119 Type 2 diabetes mellitus without complications: Secondary | ICD-10-CM | POA: Diagnosis not present

## 2017-10-30 DIAGNOSIS — G309 Alzheimer's disease, unspecified: Secondary | ICD-10-CM | POA: Diagnosis not present

## 2017-11-29 DIAGNOSIS — G309 Alzheimer's disease, unspecified: Secondary | ICD-10-CM | POA: Diagnosis not present

## 2017-11-29 DIAGNOSIS — F0280 Dementia in other diseases classified elsewhere without behavioral disturbance: Secondary | ICD-10-CM | POA: Diagnosis not present

## 2017-11-29 DIAGNOSIS — R2681 Unsteadiness on feet: Secondary | ICD-10-CM | POA: Diagnosis not present

## 2017-11-29 DIAGNOSIS — E119 Type 2 diabetes mellitus without complications: Secondary | ICD-10-CM | POA: Diagnosis not present

## 2018-01-03 DIAGNOSIS — E119 Type 2 diabetes mellitus without complications: Secondary | ICD-10-CM | POA: Diagnosis not present

## 2018-01-03 DIAGNOSIS — R2681 Unsteadiness on feet: Secondary | ICD-10-CM | POA: Diagnosis not present

## 2018-01-03 DIAGNOSIS — J219 Acute bronchiolitis, unspecified: Secondary | ICD-10-CM | POA: Diagnosis not present

## 2018-01-03 DIAGNOSIS — E1169 Type 2 diabetes mellitus with other specified complication: Secondary | ICD-10-CM | POA: Diagnosis not present

## 2018-01-04 DIAGNOSIS — R5381 Other malaise: Secondary | ICD-10-CM | POA: Diagnosis not present

## 2018-01-04 DIAGNOSIS — E119 Type 2 diabetes mellitus without complications: Secondary | ICD-10-CM | POA: Diagnosis not present

## 2018-01-04 DIAGNOSIS — Z125 Encounter for screening for malignant neoplasm of prostate: Secondary | ICD-10-CM | POA: Diagnosis not present

## 2018-01-04 DIAGNOSIS — I10 Essential (primary) hypertension: Secondary | ICD-10-CM | POA: Diagnosis not present

## 2018-01-04 DIAGNOSIS — D518 Other vitamin B12 deficiency anemias: Secondary | ICD-10-CM | POA: Diagnosis not present

## 2018-01-04 DIAGNOSIS — E7849 Other hyperlipidemia: Secondary | ICD-10-CM | POA: Diagnosis not present

## 2018-01-10 DIAGNOSIS — R2681 Unsteadiness on feet: Secondary | ICD-10-CM | POA: Diagnosis not present

## 2018-01-10 DIAGNOSIS — G309 Alzheimer's disease, unspecified: Secondary | ICD-10-CM | POA: Diagnosis not present

## 2018-01-10 DIAGNOSIS — F0280 Dementia in other diseases classified elsewhere without behavioral disturbance: Secondary | ICD-10-CM | POA: Diagnosis not present

## 2018-01-10 DIAGNOSIS — E119 Type 2 diabetes mellitus without complications: Secondary | ICD-10-CM | POA: Diagnosis not present

## 2018-03-13 DIAGNOSIS — E119 Type 2 diabetes mellitus without complications: Secondary | ICD-10-CM | POA: Diagnosis not present

## 2018-03-13 DIAGNOSIS — F0280 Dementia in other diseases classified elsewhere without behavioral disturbance: Secondary | ICD-10-CM | POA: Diagnosis not present

## 2018-03-13 DIAGNOSIS — G309 Alzheimer's disease, unspecified: Secondary | ICD-10-CM | POA: Diagnosis not present

## 2018-03-13 DIAGNOSIS — E889 Metabolic disorder, unspecified: Secondary | ICD-10-CM | POA: Diagnosis not present

## 2018-06-11 DIAGNOSIS — E889 Metabolic disorder, unspecified: Secondary | ICD-10-CM | POA: Diagnosis not present

## 2018-06-11 DIAGNOSIS — E119 Type 2 diabetes mellitus without complications: Secondary | ICD-10-CM | POA: Diagnosis not present

## 2018-06-11 DIAGNOSIS — G309 Alzheimer's disease, unspecified: Secondary | ICD-10-CM | POA: Diagnosis not present

## 2018-06-11 DIAGNOSIS — Z0000 Encounter for general adult medical examination without abnormal findings: Secondary | ICD-10-CM | POA: Diagnosis not present

## 2018-06-11 DIAGNOSIS — F0280 Dementia in other diseases classified elsewhere without behavioral disturbance: Secondary | ICD-10-CM | POA: Diagnosis not present

## 2018-08-12 IMAGING — CT CT RENAL STONE PROTOCOL
2 of 4 series · 16 of 46 positions shown, 18 images · non-contrast
Comparison: None.

CLINICAL DATA: Right flank pain since last night.

EXAM:
CT ABDOMEN AND PELVIS WITHOUT CONTRAST
TECHNIQUE: Multidetector CT imaging of the abdomen and pelvis was performed
following the standard protocol without IV contrast.

[Series 2: stone full standard · axial · 0.77mm/px · z∈[-476,-16]mm · 13 of 101 slices shown, 15 images]
[im 5/101  soft-tissue]
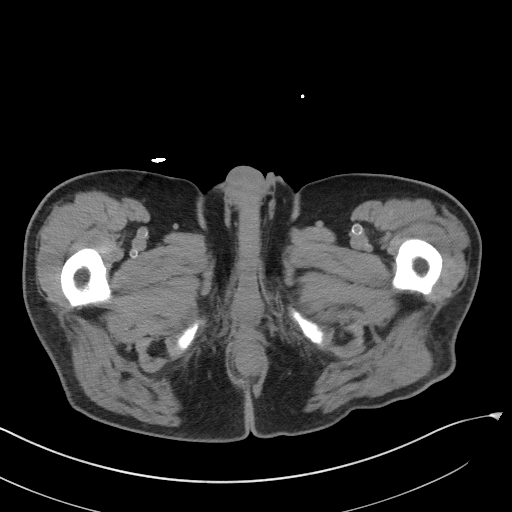
[im 5/101  bone]
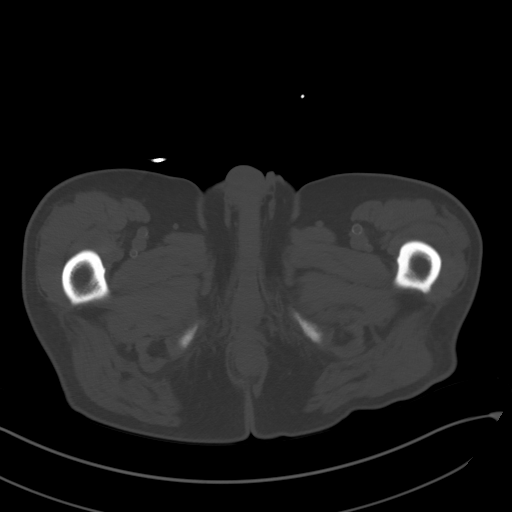
[im 13/101  soft-tissue]
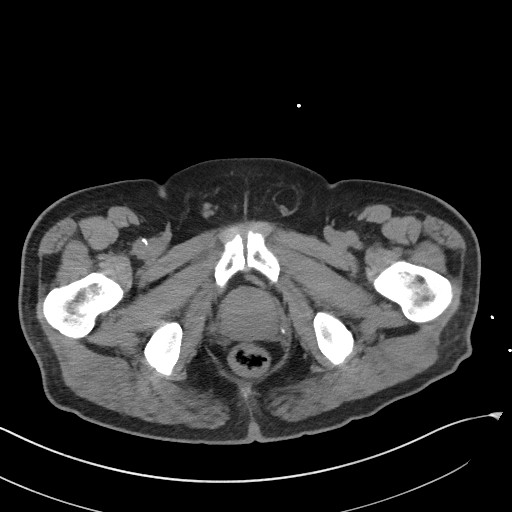
[im 21/101  soft-tissue]
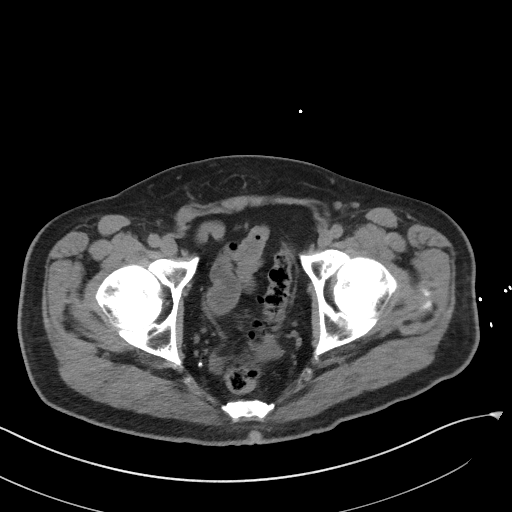
[im 29/101  soft-tissue]
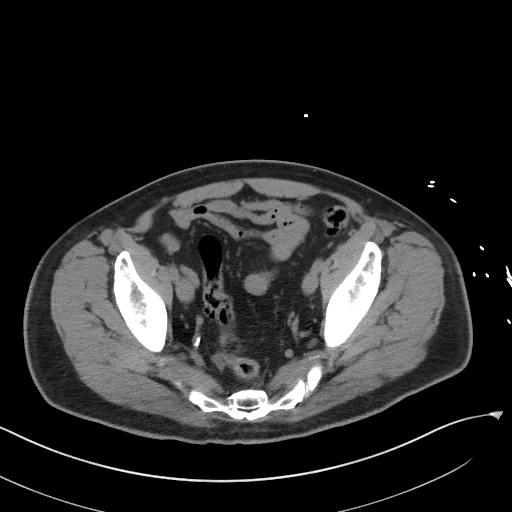
[im 37/101  soft-tissue]
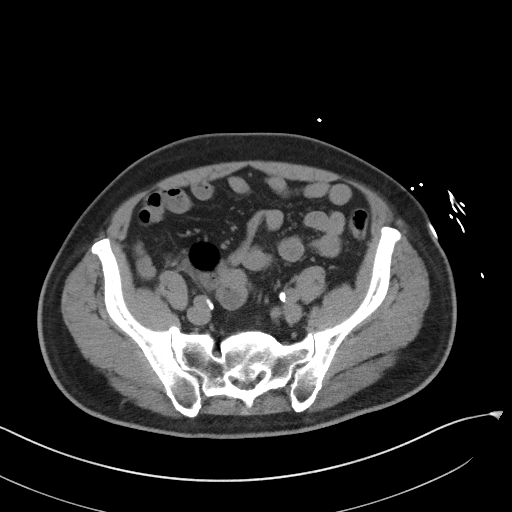
[im 45/101  soft-tissue]
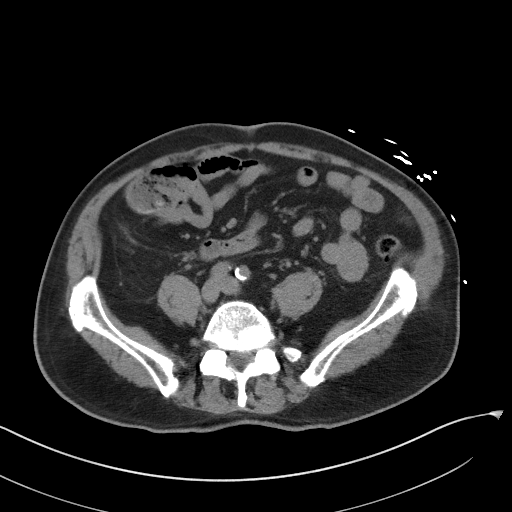
[im 53/101  soft-tissue]
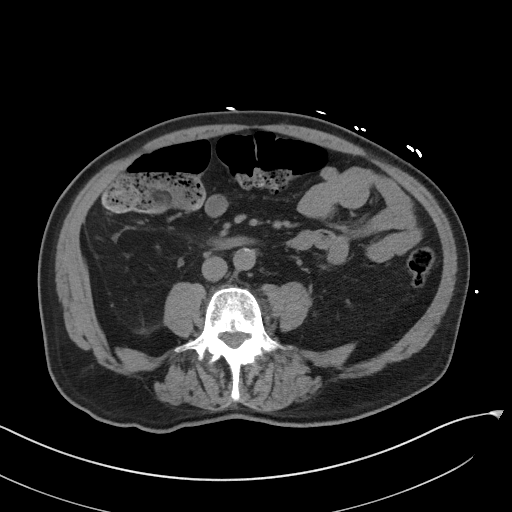
[im 57/101  soft-tissue]
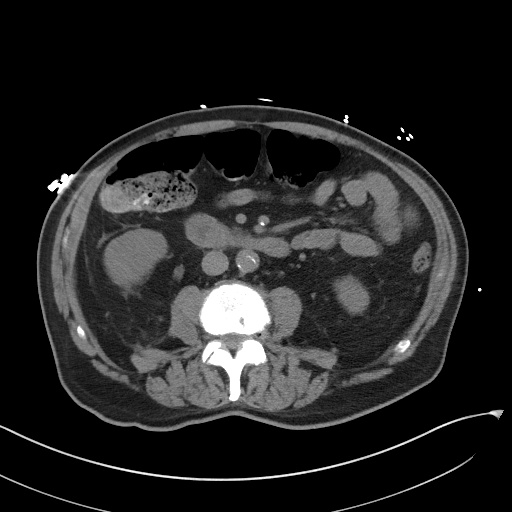
[im 65/101  soft-tissue]
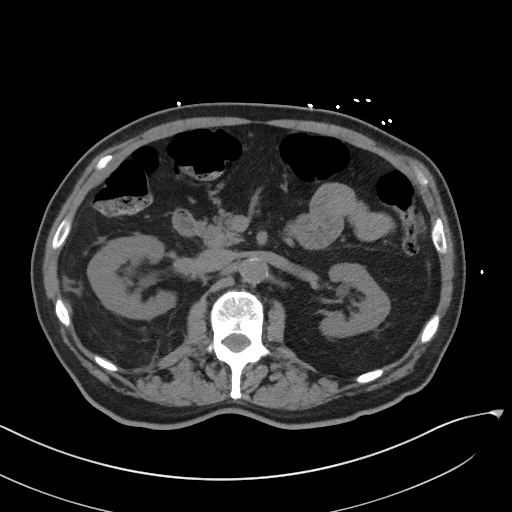
[im 65/101  bone]
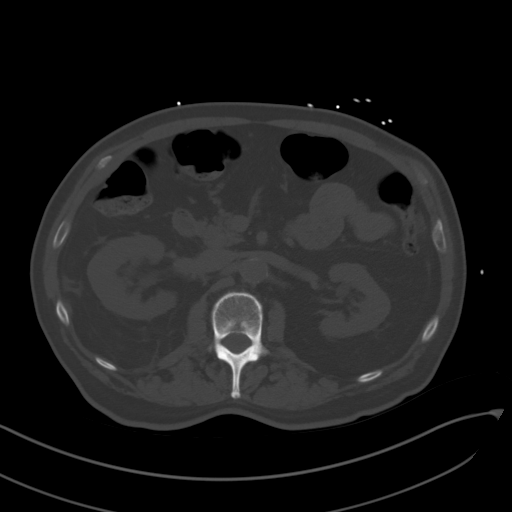
[im 73/101  soft-tissue]
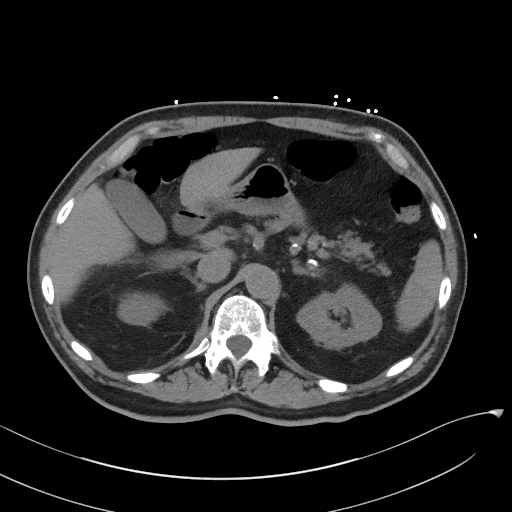
[im 81/101  soft-tissue]
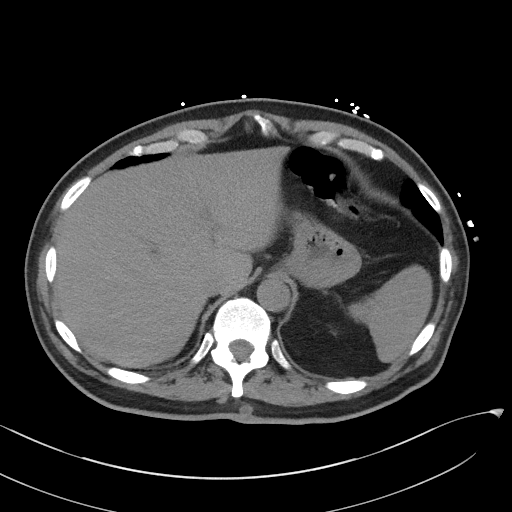
[im 89/101  soft-tissue]
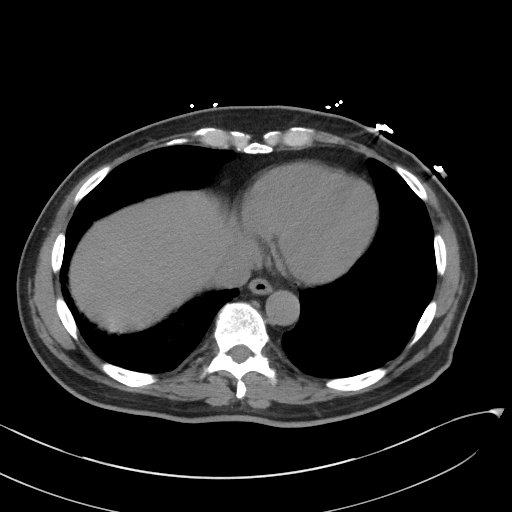
[im 97/101  soft-tissue]
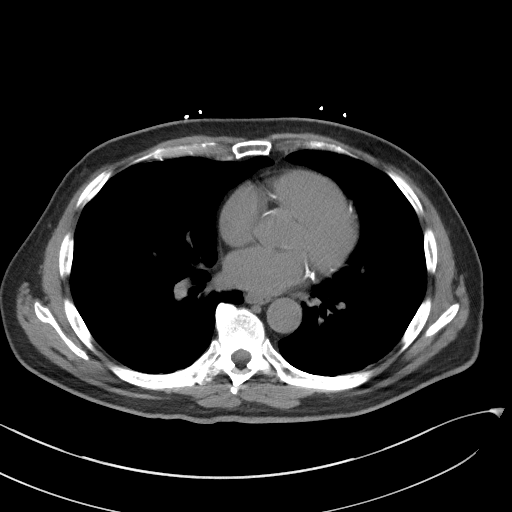

[Series 5: coronal · coronal · 0.84mm/px · 3 of 129 slices shown]
[im 43/129  soft-tissue]
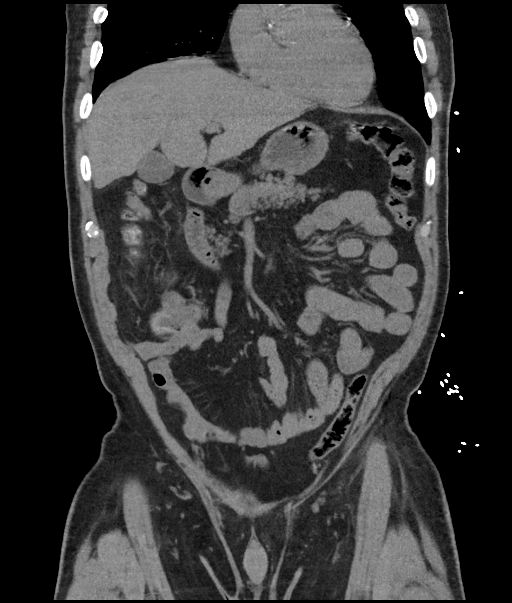
[im 57/129  soft-tissue]
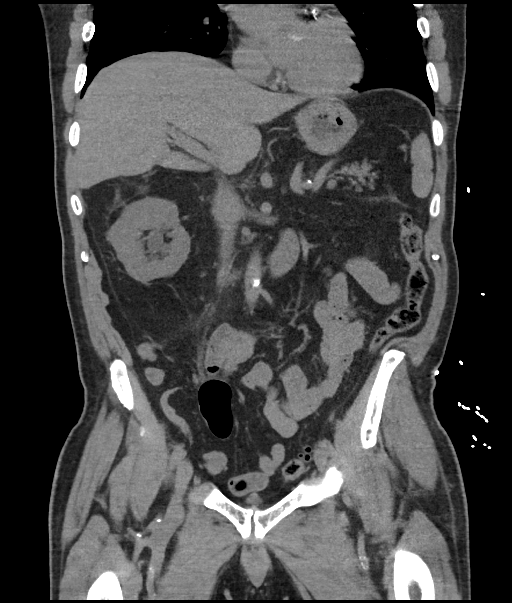
[im 72/129  soft-tissue]
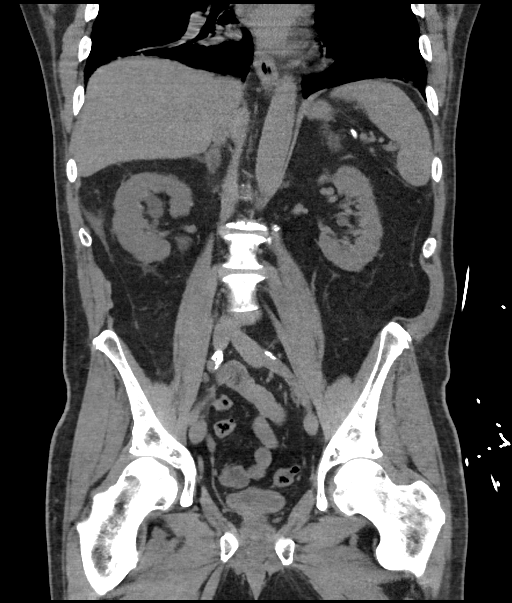

[16 of 46 positions shown; findings below may reference images not displayed]

FINDINGS: Lower chest: Dependent atelectasis. 6 mm nodule in the right lower
lobe is stable in retrospect. This occurs on image 8 of series 4 on
today's study and image 37 of series 3 on the prior study dated
10/31/2014. Previously visualized ground-glass opacities have
markedly improved.

Hepatobiliary: Liver and gallbladder are within normal limits.

Pancreas: Unremarkable

Spleen: Unremarkable

Adrenals/Urinary Tract: Adrenal glands are within normal limits.
Mild atrophy of the left kidney. Moderate right hydronephrosis. Tiny
calculus in the lower pole of the right kidney measures 1 mm. 3 mm
calculus in the proximal right ureter. There is stranding in the
retroperitoneal fat along the right ureter. Bladder is decompressed.

Stomach/Bowel: Stomach and duodenum are unremarkable. Diverticulosis
of the descending and sigmoid colon. No obvious mass in the colon.
No evidence of small-bowel obstruction.

Vascular/Lymphatic: No evidence of retroperitoneal adenopathy.
Atherosclerotic calcifications of the aorta and iliac artery's is
present.

Reproductive: Prostate gland is borderline enlarged.

Other: No free-fluid.  Left inguinal hernia contains adipose tissue.

Musculoskeletal: No vertebral compression deformity. Lumbar
degenerative disc disease.
IMPRESSION: Stable subcentimeter right lower lobe pulmonary nodule supporting
benign etiology.

Bibasilar ground-glass opacities have markedly improved supporting
non neoplastic etiology such as an inflammatory process

3 mm proximal right ureteral calculus is associated with right
hydronephrosis.

Right nephrolithiasis.

Left inguinal hernia contains adipose tissue.

Aortic atherosclerosis.

## 2019-01-17 DIAGNOSIS — L089 Local infection of the skin and subcutaneous tissue, unspecified: Secondary | ICD-10-CM | POA: Diagnosis not present

## 2019-01-17 DIAGNOSIS — E119 Type 2 diabetes mellitus without complications: Secondary | ICD-10-CM | POA: Diagnosis not present

## 2019-01-17 DIAGNOSIS — L723 Sebaceous cyst: Secondary | ICD-10-CM | POA: Diagnosis not present

## 2019-01-20 DIAGNOSIS — L720 Epidermal cyst: Secondary | ICD-10-CM | POA: Diagnosis not present

## 2019-01-27 DIAGNOSIS — L02212 Cutaneous abscess of back [any part, except buttock]: Secondary | ICD-10-CM | POA: Diagnosis not present

## 2019-02-05 ENCOUNTER — Other Ambulatory Visit: Payer: Self-pay

## 2019-02-05 ENCOUNTER — Encounter: Payer: Self-pay | Admitting: Emergency Medicine

## 2019-02-05 ENCOUNTER — Observation Stay
Admission: EM | Admit: 2019-02-05 | Discharge: 2019-02-06 | Disposition: A | Discharge status: Home / Self Care | Payer: Medicare Other | Attending: Internal Medicine | Admitting: Internal Medicine

## 2019-02-05 ENCOUNTER — Emergency Department: Payer: Medicare Other

## 2019-02-05 DIAGNOSIS — R410 Disorientation, unspecified: Secondary | ICD-10-CM | POA: Diagnosis not present

## 2019-02-05 DIAGNOSIS — S0101XA Laceration without foreign body of scalp, initial encounter: Principal | ICD-10-CM | POA: Insufficient documentation

## 2019-02-05 DIAGNOSIS — R778 Other specified abnormalities of plasma proteins: Secondary | ICD-10-CM | POA: Diagnosis present

## 2019-02-05 DIAGNOSIS — Z66 Do not resuscitate: Secondary | ICD-10-CM | POA: Diagnosis not present

## 2019-02-05 DIAGNOSIS — Z7982 Long term (current) use of aspirin: Secondary | ICD-10-CM | POA: Diagnosis not present

## 2019-02-05 DIAGNOSIS — R7989 Other specified abnormal findings of blood chemistry: Secondary | ICD-10-CM | POA: Diagnosis not present

## 2019-02-05 DIAGNOSIS — F0390 Unspecified dementia without behavioral disturbance: Secondary | ICD-10-CM | POA: Diagnosis not present

## 2019-02-05 DIAGNOSIS — Y92009 Unspecified place in unspecified non-institutional (private) residence as the place of occurrence of the external cause: Secondary | ICD-10-CM | POA: Diagnosis not present

## 2019-02-05 DIAGNOSIS — Z1159 Encounter for screening for other viral diseases: Secondary | ICD-10-CM | POA: Insufficient documentation

## 2019-02-05 DIAGNOSIS — S0990XA Unspecified injury of head, initial encounter: Secondary | ICD-10-CM

## 2019-02-05 DIAGNOSIS — I10 Essential (primary) hypertension: Secondary | ICD-10-CM | POA: Diagnosis not present

## 2019-02-05 DIAGNOSIS — Z794 Long term (current) use of insulin: Secondary | ICD-10-CM | POA: Diagnosis not present

## 2019-02-05 DIAGNOSIS — Z03818 Encounter for observation for suspected exposure to other biological agents ruled out: Secondary | ICD-10-CM | POA: Diagnosis not present

## 2019-02-05 DIAGNOSIS — E119 Type 2 diabetes mellitus without complications: Secondary | ICD-10-CM | POA: Insufficient documentation

## 2019-02-05 DIAGNOSIS — E876 Hypokalemia: Secondary | ICD-10-CM | POA: Diagnosis not present

## 2019-02-05 DIAGNOSIS — Z79899 Other long term (current) drug therapy: Secondary | ICD-10-CM | POA: Insufficient documentation

## 2019-02-05 DIAGNOSIS — W19XXXA Unspecified fall, initial encounter: Secondary | ICD-10-CM | POA: Diagnosis not present

## 2019-02-05 LAB — COMPREHENSIVE METABOLIC PANEL
ALT: 17 U/L (ref 0–44)
AST: 21 U/L (ref 15–41)
Albumin: 4.1 g/dL (ref 3.5–5.0)
Alkaline Phosphatase: 56 U/L (ref 38–126)
Anion gap: 12 (ref 5–15)
BUN: 15 mg/dL (ref 8–23)
CO2: 23 mmol/L (ref 22–32)
Calcium: 9 mg/dL (ref 8.9–10.3)
Chloride: 105 mmol/L (ref 98–111)
Creatinine, Ser: 0.77 mg/dL (ref 0.61–1.24)
GFR calc Af Amer: >60 mL/min (ref >60)
GFR calc non Af Amer: >60 mL/min (ref >60)
Glucose, Bld: 207 mg/dL — ABNORMAL HIGH (ref 70–99)
Potassium: 3.2 mmol/L — ABNORMAL LOW (ref 3.5–5.1)
Sodium: 140 mmol/L (ref 135–145)
Total Bilirubin: 0.6 mg/dL (ref 0.3–1.2)
Total Protein: 6.9 g/dL (ref 6.5–8.1)

## 2019-02-05 LAB — DIFFERENTIAL
Abs Immature Granulocytes: 0.08 10*3/uL — ABNORMAL HIGH (ref 0.00–0.07)
Basophils Absolute: 0 10*3/uL (ref 0.0–0.1)
Basophils Relative: 0 %
Eosinophils Absolute: 0.2 10*3/uL (ref 0.0–0.5)
Eosinophils Relative: 2 %
Immature Granulocytes: 1 %
Lymphocytes Relative: 29 %
Lymphs Abs: 2.6 10*3/uL (ref 0.7–4.0)
Monocytes Absolute: 0.6 10*3/uL (ref 0.1–1.0)
Monocytes Relative: 7 %
Neutro Abs: 5.4 10*3/uL (ref 1.7–7.7)
Neutrophils Relative %: 61 %

## 2019-02-05 LAB — TROPONIN I (HIGH SENSITIVITY)
Troponin I (High Sensitivity): 59 ng/L — ABNORMAL HIGH (ref <18)
Troponin I (High Sensitivity): 58 ng/L — ABNORMAL HIGH (ref <18)
Troponin I (High Sensitivity): 66 ng/L — ABNORMAL HIGH (ref <18)

## 2019-02-05 LAB — CBC
HCT: 46 % (ref 39.0–52.0)
Hemoglobin: 15 g/dL (ref 13.0–17.0)
MCH: 30.7 pg (ref 26.0–34.0)
MCHC: 32.6 g/dL (ref 30.0–36.0)
MCV: 94.1 fL (ref 80.0–100.0)
Platelets: 207 10*3/uL (ref 150–400)
RBC: 4.89 MIL/uL (ref 4.22–5.81)
RDW: 13.4 % (ref 11.5–15.5)
WBC: 9 10*3/uL (ref 4.0–10.5)
nRBC: 0 % (ref 0.0–0.2)

## 2019-02-05 LAB — BRAIN NATRIURETIC PEPTIDE: B Natriuretic Peptide: 510 pg/mL — ABNORMAL HIGH (ref 0.0–100.0)

## 2019-02-05 LAB — APTT: aPTT: 35 seconds (ref 24–36)

## 2019-02-05 LAB — PROTIME-INR
INR: 1.1 (ref 0.8–1.2)
Prothrombin Time: 13.7 seconds (ref 11.4–15.2)

## 2019-02-05 LAB — GLUCOSE, CAPILLARY: Glucose-Capillary: 194 mg/dL — ABNORMAL HIGH (ref 70–99)

## 2019-02-05 LAB — SARS CORONAVIRUS 2 BY RT PCR (HOSPITAL ORDER, PERFORMED IN ~~LOC~~ HOSPITAL LAB): SARS Coronavirus 2: NEGATIVE

## 2019-02-05 LAB — CK: Total CK: 49 U/L (ref 49–397)

## 2019-02-05 MED ORDER — NITROGLYCERIN 0.4 MG SL SUBL: 0.4000 mg | SUBLINGUAL_TABLET | SUBLINGUAL | Status: DC | PRN

## 2019-02-05 MED ORDER — HEPARIN (PORCINE) 25000 UT/250ML-% IV SOLN
1050.0000 [IU]/h | INTRAVENOUS | Status: DC
  Administered 2019-02-05: 900 [IU]/h via INTRAVENOUS

## 2019-02-05 MED ORDER — POTASSIUM CHLORIDE CRYS ER 20 MEQ PO TBCR
40.0000 meq | EXTENDED_RELEASE_TABLET | Freq: Once | ORAL | Status: AC
  Administered 2019-02-05: 40 meq via ORAL
  Filled 2019-02-05: qty 2

## 2019-02-05 MED ORDER — ONDANSETRON HCL 4 MG/2ML IJ SOLN: 4.0000 mg | Freq: Four times a day (QID) | INTRAMUSCULAR | Status: DC | PRN

## 2019-02-05 MED ORDER — ACETAMINOPHEN 325 MG PO TABS: 650.0000 mg | ORAL_TABLET | ORAL | Status: DC | PRN

## 2019-02-05 MED ORDER — ASPIRIN 300 MG RE SUPP: 300.0000 mg | RECTAL | Status: AC

## 2019-02-05 MED ORDER — ATORVASTATIN CALCIUM 20 MG PO TABS
40.0000 mg | ORAL_TABLET | Freq: Every day | ORAL | Status: DC
  Administered 2019-02-05: 40 mg via ORAL
  Filled 2019-02-05: qty 2

## 2019-02-05 MED ORDER — CARVEDILOL 6.25 MG PO TABS
3.1250 mg | ORAL_TABLET | Freq: Two times a day (BID) | ORAL | Status: DC
  Administered 2019-02-06: 3.125 mg via ORAL
  Filled 2019-02-05: qty 1

## 2019-02-05 MED ORDER — ASPIRIN EC 81 MG PO TBEC
81.0000 mg | DELAYED_RELEASE_TABLET | Freq: Every day | ORAL | Status: DC
  Administered 2019-02-06: 81 mg via ORAL
  Filled 2019-02-05: qty 1

## 2019-02-05 MED ORDER — HEPARIN BOLUS VIA INFUSION
4000.0000 [IU] | Freq: Once | INTRAVENOUS | Status: AC
  Administered 2019-02-05: 4000 [IU] via INTRAVENOUS
  Filled 2019-02-05: qty 4000

## 2019-02-05 MED ORDER — SODIUM CHLORIDE 0.9 % IV SOLN
INTRAVENOUS | Status: DC
  Administered 2019-02-05: 22:00:00 via INTRAVENOUS

## 2019-02-05 MED ORDER — ASPIRIN 81 MG PO CHEW
324.0000 mg | CHEWABLE_TABLET | ORAL | Status: AC
  Administered 2019-02-05: 324 mg via ORAL
  Filled 2019-02-05: qty 4

## 2019-02-05 NOTE — H&P (Signed)
[facility] [place] [facility]   PATIENT NAME: Luke BickersWilliam Chen    MR#:  [id number]  DATE OF BIRTH:  04/24/1941  DATE OF ADMISSION:  02/05/2019  PRIMARY CARE PHYSICIAN: Corky DownsMasoud, Javed, MD   REQUESTING/REFERRING PHYSICIAN:   CHIEF COMPLAINT:   Chief Complaint  Patient presents with  . Fall  . Head Injury    HISTORY OF PRESENT ILLNESS: Luke Chen  is a 78 y.o. male with a known history of type 2 diabetes mellitus, hypertension, dementia presented to the emergency room for fall.  Patient is accompanied by family member.  Patient fell down in the bathtub but no loss of consciousness or head injury.  Was worked up with CT head which showed no acute abnormality.  Work-up in the emergency room showed elevated troponin.  No complaints of any chest pain.  No shortness of breath.  CK level has been normal.  Hospitalist service has been consulted.  PAST MEDICAL HISTORY:   Past Medical History:  Diagnosis Date  . Dementia (HCC)   . Diabetes mellitus without complication (HCC)   . Hypertension     PAST SURGICAL HISTORY: None  SOCIAL HISTORY:  Social History   Tobacco Use  . Smoking status: Never Smoker  . Smokeless tobacco: Never Used  Substance Use Topics  . Alcohol use: Never    Frequency: Never    FAMILY HISTORY: Mother and father deceased  DRUG ALLERGIES: No Known Allergies  REVIEW OF SYSTEMS:   CONSTITUTIONAL: No fever, fatigue or weakness.  EYES: No blurred or double vision.  EARS, NOSE, AND THROAT: No tinnitus or ear pain.  RESPIRATORY: No cough, shortness of breath, wheezing or hemoptysis.  CARDIOVASCULAR: No chest pain, orthopnea, edema.  GASTROINTESTINAL: No nausea, vomiting, diarrhea or abdominal pain.  GENITOURINARY: No dysuria, hematuria.  ENDOCRINE: No polyuria, nocturia,  HEMATOLOGY: No anemia, easy bruising or bleeding SKIN: No rash or lesion. MUSCULOSKELETAL: No joint pain or arthritis.   NEUROLOGIC: No tingling, numbness,  weakness.  PSYCHIATRY: No anxiety or depression.   MEDICATIONS AT HOME:  Prior to Admission medications   Medication Sig Start Date End Date Taking? Authorizing Provider  ondansetron (ZOFRAN ODT) 4 MG disintegrating tablet Take 1 tablet (4 mg total) by mouth every 8 (eight) hours as needed for nausea or vomiting. 05/16/17   Nita SickleVeronese, , MD      PHYSICAL EXAMINATION:   VITAL SIGNS: Blood pressure 121/74, pulse 73, temperature 98.2 F (36.8 C), temperature source Oral, resp. rate 19, height 5\' 7"  (1.702 m), weight 78 kg, SpO2 100 %.  GENERAL:  78 y.o.-year-old patient lying in the bed with no acute distress.  EYES: Pupils equal, round, reactive to light and accommodation. No scleral icterus. Extraocular muscles intact.  HEENT: Head atraumatic, normocephalic. Oropharynx and nasopharynx clear.  NECK:  Supple, no jugular venous distention. No thyroid enlargement, no tenderness.  LUNGS: Normal breath sounds bilaterally, no wheezing, rales,rhonchi or crepitation. No use of accessory muscles of respiration.  CARDIOVASCULAR: S1, S2 normal. No murmurs, rubs, or gallops.  ABDOMEN: Soft, nontender, nondistended. Bowel sounds present. No organomegaly or mass.  EXTREMITIES: No pedal edema, cyanosis, or clubbing.  NEUROLOGIC: Cranial nerves II through XII are intact. Muscle strength 5/5 in all extremities. Sensation intact. Gait not checked.  PSYCHIATRIC: The patient is alert and oriented x 3.  SKIN: No obvious rash, lesion, or ulcer.   LABORATORY PANEL:   CBC Recent Labs  Lab 02/05/19 1329  WBC 9.0  HGB 15.0  HCT 46.0  PLT 207  MCV 94.1  MCH 30.7  MCHC 32.6  RDW 13.4  LYMPHSABS 2.6  MONOABS 0.6  EOSABS 0.2  BASOSABS 0.0   ------------------------------------------------------------------------------------------------------------------  Chemistries  Recent Labs  Lab 02/05/19 1329  NA 140  K 3.2*  CL 105  CO2 23  GLUCOSE 207*  BUN 15  CREATININE 0.77  CALCIUM 9.0   AST 21  ALT 17  ALKPHOS 56  BILITOT 0.6   ------------------------------------------------------------------------------------------------------------------ estimated creatinine clearance is 74.6 mL/min (by C-G formula based on SCr of 0.77 mg/dL). ------------------------------------------------------------------------------------------------------------------ No results for input(s): TSH, T4TOTAL, T3FREE, THYROIDAB in the last 72 hours.  Invalid input(s): FREET3   Coagulation profile Recent Labs  Lab 02/05/19 1329  INR 1.1   ------------------------------------------------------------------------------------------------------------------- No results for input(s): DDIMER in the last 72 hours. -------------------------------------------------------------------------------------------------------------------  Cardiac Enzymes No results for input(s): CKMB, TROPONINI, MYOGLOBIN in the last 168 hours.  Invalid input(s): CK ------------------------------------------------------------------------------------------------------------------ Invalid input(s): POCBNP  ---------------------------------------------------------------------------------------------------------------  Urinalysis    Component Value Date/Time   COLORURINE ORANGE (A) 05/16/2017 1648   APPEARANCEUR CLEAR 05/16/2017 1648   LABSPEC 1.031 (H) 05/16/2017 1648   PHURINE  05/16/2017 1648    TEST NOT REPORTED DUE TO COLOR INTERFERENCE OF URINE PIGMENT   GLUCOSEU (A) 05/16/2017 1648    TEST NOT REPORTED DUE TO COLOR INTERFERENCE OF URINE PIGMENT   HGBUR (A) 05/16/2017 1648    TEST NOT REPORTED DUE TO COLOR INTERFERENCE OF URINE PIGMENT   BILIRUBINUR (A) 05/16/2017 1648    TEST NOT REPORTED DUE TO COLOR INTERFERENCE OF URINE PIGMENT   KETONESUR (A) 05/16/2017 1648    TEST NOT REPORTED DUE TO COLOR INTERFERENCE OF URINE PIGMENT   PROTEINUR (A) 05/16/2017 1648    TEST NOT REPORTED DUE TO COLOR INTERFERENCE OF URINE  PIGMENT   NITRITE (A) 05/16/2017 1648    TEST NOT REPORTED DUE TO COLOR INTERFERENCE OF URINE PIGMENT   LEUKOCYTESUR (A) 05/16/2017 1648    TEST NOT REPORTED DUE TO COLOR INTERFERENCE OF URINE PIGMENT     RADIOLOGY: Ct Head Wo Contrast  Result Date: 02/05/2019 CLINICAL DATA:  Fall today with head injury. Intermittent confusion. EXAM: CT HEAD WITHOUT CONTRAST TECHNIQUE: Contiguous axial images were obtained from the base of the skull through the vertex without intravenous contrast. COMPARISON:  CT head 10/31/2014. FINDINGS: Brain: There is no evidence of acute intracranial hemorrhage, mass lesion, brain edema or extra-axial fluid collection. The ventricles and subarachnoid spaces are appropriately sized for age. There are stable mild chronic small vessel ischemic changes in the periventricular white matter. No evidence of acute cortical stroke. Vascular:  No hyperdense vessel identified. Skull: Negative for fracture or focal lesion. Sinuses/Orbits: Chronic mucosal thickening in the ethmoid and sphenoid sinuses, similar to previous study. The additional paranasal sinuses, mastoid air cells and middle ears are clear. No significant orbital abnormalities. Other: None. IMPRESSION: 1. No acute intracranial findings. 2. Stable chronic mucosal thickening throughout the ethmoid and sphenoid sinuses. Electronically Signed   By: Richardean Sale M.D.   On: 02/05/2019 14:08    EKG: Orders placed or performed during the hospital encounter of 02/05/19  . ED EKG  . ED EKG    IMPRESSION AND PLAN: 78 year old male patient with a known history of type 2 diabetes mellitus, hypertension, dementia presented to the emergency room for fall.  Patient is accompanied by family member.  Patient fell down in the bathtub but no loss of consciousness or head injury.   -Elevated troponin Cardiology consult Cycle troponin Check echocardiogram  Telemetry monitoring Admit to telemetry observation bed N.p.o. after midnight  for possible intervention by cardiology  -Fall Physical therapy evaluation once cleared by cardiology  -Acute hypokalemia Replace potassium orally  -Type 2 diabetes mellitus Diabetic diet with sliding scale coverage with insulin  -DVT prophylaxis On heparin drip for anticoagulation for now  All the records are reviewed and case discussed with ED provider. Management plans discussed with the patient, family and they are in agreement.  CODE STATUS:Full code  TOTAL TIME TAKING CARE OF THIS PATIENT: 54 minutes.    Ihor AustinPavan Talor Desrosiers M.D on 02/05/2019 at 8:01 PM  Between 7am to 6pm - Pager - [phone]  After 6pm go to [email or web address] - password [email or web address] [facility]  [facility]  Office  [phone]  CC: Primary care physician; Corky DownsMasoud, Javed, MD
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
## 2019-02-05 NOTE — ED Notes (Signed)
Wife left bedside to go home for a little bit. Pt understands use of call bell.

## 2019-02-05 NOTE — Discharge Instructions (Addendum)
Pt will f/u urgent care on his appt for his Head stiches removal  Chronic Sebacious cyst--  Dressing procedure/placement/frequency: Cleanse wound to pack with NS and pat dry.  Gently fill wound bed with packing strip.  Cover with dry dressing and tape.  Change daily

## 2019-02-05 NOTE — ED Notes (Signed)
This RN and Manuela Schwartz [facility] to the bedside to discuss plan of care. PA waiting on results of troponin. Pt shown where the toilet is and how to remove bp cuff, and bandage rewrapped on pt head to cover wound. Wife instructed that she may uncover once pt is home but to leave it on while they are here in hospital.

## 2019-02-05 NOTE — Progress Notes (Signed)
Talked to Dr. Jannifer Franklin about patient's troponin at 38, patient on a heparin drip, patient asymptomatic, denies chest pain.  Also let him know that patient's wife came up with him from the ED because patient has dementia and was argumentative and wanting to leave awhile ago. Wife is the only one who can calm him down. Per MD patient's wife ok to stay overnight. Patient also came in for fall, he is in a low bed now. RN will continue to monitor.

## 2019-02-05 NOTE — Consult Note (Signed)
[place] for Heparin gtt Indication: ACS / STEMI  No Known Allergies  Patient Measurements: Height: 5\' 7"  (170.2 cm) Weight: 172 lb (78 kg) IBW/kg (Calculated) : 66.1 Heparin Dosing Weight: 78 kg  Vital Signs: Temp: 98.2 F (36.8 C) (06/24 1314) Temp Source: Oral (06/24 1314) BP: 121/74 (06/24 1800) Pulse Rate: 73 (06/24 1800)  Labs: Recent Labs    02/05/19 1329 02/05/19 1655  HGB 15.0  --   HCT 46.0  --   PLT 207  --   APTT 35  --   LABPROT 13.7  --   INR 1.1  --   CREATININE 0.77  --   CKTOTAL  --  49    Estimated Creatinine Clearance: 74.6 mL/min (by C-G formula based on SCr of 0.77 mg/dL).   Medications:  No PTA anticoagulants - confirmed by patient.   Assessment: Mr. Bielinski is 69 YOM who present with confusion 2/2 to a head injury.   Hgb 15  RBC 4.89  Goal of Therapy:  Heparin level 0.3-0.7 units/ml Monitor platelets by anticoagulation protocol: Yes   Plan:  Baseline labs have been ordered  Heparin DW: 78 kg Give 4000 units bolus x 1 Start heparin infusion at 900 units/hr Check anti-Xa level in 8 hours and daily while on heparin, per protocol Continue to monitor H&H and platelets    Virl Coble R Lovenia Debruler 02/05/2019,8:03 PM
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
## 2019-02-05 NOTE — ED Provider Notes (Signed)
[facility] Emergency Department Provider Note  ____________________________________________   None    (approximate)  I have reviewed the triage vital signs and the nursing notes.   HISTORY  Chief Complaint Fall and Head Injury    HPI Luke Chen is a 78 y.o. male since emergency department after [facility].  Patient was standing in the shower this morning.  His wife states she was washing his back and turned her back when he fell hitting the shower door.  States he hit the side of head and has been a little more confused  since the fall.  [facility] and they sutured the laceration.  Sent him to the ED for evaluation of confusion.   Past Medical History:  Diagnosis Date  . Dementia (HCC)   . Diabetes mellitus without complication (HCC)   . Hypertension     There are no active problems to display for this patient.   History reviewed. No pertinent surgical history.  Prior to Admission medications   Medication Sig Start Date End Date Taking? Authorizing Provider  ondansetron (ZOFRAN ODT) 4 MG disintegrating tablet Take 1 tablet (4 mg total) by mouth every 8 (eight) hours as needed for nausea or vomiting. 05/16/17   Nita SickleVeronese, [place], MD    Allergies Patient has no known allergies.  No family history on file.  Social History Social History   Tobacco Use  . Smoking status: Not on file  Substance Use Topics  . Alcohol use: Not on file  . Drug use: Not on file    Review of Systems  Constitutional: No fever/chills, altered mental status, head injury Eyes: No visual changes. ENT: No sore throat. Respiratory: Denies cough Genitourinary: Negative for dysuria. Musculoskeletal: Negative for back pain. Skin: Negative for rash.  [facility]   ____________________________________________   PHYSICAL EXAM:  VITAL SIGNS: ED Triage Vitals  Enc Vitals Group     BP 02/05/19  1314 (!) 155/74     Pulse Rate 02/05/19 1314 70     Resp 02/05/19 1314 19     Temp 02/05/19 1314 98.2 F (36.8 C)     Temp Source 02/05/19 1314 Oral     SpO2 02/05/19 1314 97 %     Weight 02/05/19 1315 172 lb (78 kg)     Height 02/05/19 1315 5\' 7"  (1.702 m)     Head Circumference --      Peak Flow --      Pain Score 02/05/19 1325 0     Pain Loc --      Pain Edu? --      Excl. in GC? --     Constitutional: Alert and oriented. Well appearing and in no acute distress.  Is able answer all questions appropriately Eyes: Conjunctivae are normal.  Head: Atraumatic. Nose: No congestion/rhinnorhea. Mouth/Throat: Mucous membranes are moist.   Neck:  supple no lymphadenopathy noted Cardiovascular: Normal rate, regular rhythm. Heart sounds are normal Respiratory: Normal respiratory effort.  No retractions, lungs c t a  Abd: soft nontender bs normal all 4 quad GU: deferred Musculoskeletal: FROM all extremities, warm and well perfused, no C-spine tenderness, no lower back tenderness, no extremity tenderness Neurologic:  Normal speech and language.  Skin:  Skin is warm, dry and intact. No rash noted. Psychiatric: Mood and affect are normal. Speech and behavior are normal.  ____________________________________________   LABS (all labs ordered are listed, but only abnormal results  are displayed)  Labs Reviewed  GLUCOSE, CAPILLARY - Abnormal; Notable for the following components:      Result Value   Glucose-Capillary 194 (*)    All other components within normal limits  DIFFERENTIAL - Abnormal; Notable for the following components:   Abs Immature Granulocytes 0.08 (*)    All other components within normal limits  COMPREHENSIVE METABOLIC PANEL - Abnormal; Notable for the following components:   Potassium 3.2 (*)    Glucose, Bld 207 (*)    All other components within normal limits  TROPONIN I (HIGH SENSITIVITY) - Abnormal; Notable for the following components:   Troponin I (High  Sensitivity) 59 (*)    All other components within normal limits  BRAIN NATRIURETIC PEPTIDE - Abnormal; Notable for the following components:   B Natriuretic Peptide 510.0 (*)    All other components within normal limits  TROPONIN I (HIGH SENSITIVITY) - Abnormal; Notable for the following components:   Troponin I (High Sensitivity) 58 (*)    All other components within normal limits  SARS CORONAVIRUS 2 (HOSPITAL ORDER, [place] [facility])  PROTIME-INR  APTT  CBC  CK  TROPONIN I (HIGH SENSITIVITY)  CBG MONITORING, ED   ____________________________________________   ____________________________________________  RADIOLOGY  CT of the head is negative for any acute abnormality  ____________________________________________   PROCEDURES  Procedure(s) performed: No  Procedures    ____________________________________________   INITIAL IMPRESSION / ASSESSMENT AND PLAN / ED COURSE  Pertinent labs & imaging results that were available during my care of the patient were reviewed by me and considered in my medical decision making (see chart for details).   Patient 78 year old male presents emergency department after a fall shower.  Seen at wound clinic and the area was sutured.  They are concerned about confusion since fall.  Sent here for further evaluation.  Wife states he gets more confused if he is angry or upset.  Physical exam patient appears well.  Is in no acute distress.  Is able to answer my questions appropriately.  No bony tenderness noted at the C-spine or extremities.  Remainder exam is unremarkable  DDX: Intracranial bleed, head contusion, concussion, dementia  CBC is normal, basic metabolic panel has decreased potassium at 3.2 and increased glucose at 207, PT PTT are normal, glucose capillary is 194   EKG, CT the head   ----------------------------------------- 3:11 PM on 02/05/2019 -----------------------------------------  CT of the  head is negative, EKG shows T waves but some PVCs.  Troponin is elevated at 59, BNP is elevated 510, CK is normal, COVID test is negative, PT and PTT are normal  Due to the elevated troponin and EKG changes patient be admitted to the hospital.  Wife is asking that he be transferred to [facility].  On-call [facility] for transfer talk to Dr. Charlane Ferretti.  We had a long discussion about the new high-sensitivity troponins.  Although [facility] is on diversion at this time with no beds.  Therefore I discussed this with the wife and she is willing to stay here at [facility].  She has power of attorney.  Discussed this with our hospitalist.  He will be admitted to observation.   Luke Chen was evaluated in Emergency Department on 02/05/2019 for the symptoms described in the history of present illness. He was evaluated in the context of the global COVID-19 pandemic, which necessitated consideration that the patient might be at risk for infection with the SARS-CoV-2 virus that causes COVID-19. Institutional protocols and algorithms  that pertain to the evaluation of patients at risk for COVID-19 are in a state of rapid change based on information released by regulatory bodies including the CDC and federal and state organizations. These policies and algorithms were followed during the patient's care in the ED.   As part of my medical decision making, I reviewed the following data within the electronic MEDICAL RECORD NUMBER History obtained from family, Nursing notes reviewed and incorporated, Labs reviewed see above, EKG interpreted lateral ischemia, Old EKG reviewed, Old chart reviewed, Radiograph reviewed CT the head is negative, Discussed with admitting physician Dr. Aundra DubinPerretti, A consult was requested and obtained from this/these consultant(s) Cardiology, [facility]anger [facility], Evaluated by EM attending Dr. Lenard LancePaduchowski, Notes from prior ED visits and [place] Controlled Substance Database   ____________________________________________   FINAL CLINICAL IMPRESSION(S) / ED DIAGNOSES  Final diagnoses:  Injury of head, initial encounter  Fall, initial encounter  Elevated troponin      NEW MEDICATIONS STARTED DURING THIS VISIT:  New Prescriptions   No medications on file     Note:  This document was prepared using Dragon voice recognition software and may include unintentional dictation errors.    Faythe GheeFisher, Susan W, PA-C 02/05/19 1940    Minna AntisPaduchowski, Kevin, MD 02/06/19 [phone]

## 2019-02-05 NOTE — ED Notes (Signed)
Pt resting in bed.  Denies any needs at this time.

## 2019-02-05 NOTE — ED Notes (Addendum)
ED TO INPATIENT HANDOFF REPORT  ED Nurse Name and Phone #:  [name] Birkom RN  #[phone]  S Name/Age/Gender Luke Chen 78 y.o. male Room/Bed: ED14A/ED14A  Code Status   Code Status: Not on file  Home/SNF/Other Home Patient oriented to: self and place Is this baseline? Yes   Triage Complete: Triage complete  Chief Complaint altered mental status, fall  Triage Note Patient's family reports that patient fell from standing this morning when getting some bandages changed. Patient hit side of head on shower door. Patient with some intermittent confusion beyond baseline. Patient was seen by Dr. Tonna BoehringerSakai to have laceration sutured, however due to confusion, patient was sent to ED for further evaluation. Stroke screen negative. Patient denies HA.    Allergies No Known Allergies  Level of Care/Admitting Diagnosis ED Disposition    ED Disposition Condition Comment   Admit  Hospital Area[facility] [[id number]]  Level of Care: Telemetry [5]  Covid Evaluation: Confirmed COVID Negative  Diagnosis: Troponin level elevated [119147][[id number]]  Admitting Physician: Ihor AustinPYREDDY, PAVAN [[id number]]  Attending Physician: Ihor AustinPYREDDY, PAVAN [[id number]]  PT Class (Do Not Modify): Observation [104]  PT Acc Code (Do Not Modify): Observation [10022]       B Medical/Surgery History Past Medical History:  Diagnosis Date  . Dementia (HCC)   . Diabetes mellitus without complication (HCC)   . Hypertension    History reviewed. No pertinent surgical history.   A IV Location/Drains/Wounds Patient Lines/Drains/Airways Status   Active Line/Drains/Airways    Name:   Placement date:   Placement time:   Site:   Days:   Peripheral IV 02/05/19 Left Antecubital   02/05/19    1717    Antecubital   less than 1          Intake/Output Last 24 hours No intake or output data in the 24 hours ending 02/05/19 2019  Labs/Imaging Results for orders placed or performed during the hospital encounter of 02/05/19 (from the  past 48 hour(s))  Glucose, capillary     Status: Abnormal   Collection Time: 02/05/19  1:22 PM  Result Value Ref Range   Glucose-Capillary 194 (H) 70 - 99 mg/dL  Protime-INR     Status: None   Collection Time: 02/05/19  1:29 PM  Result Value Ref Range   Prothrombin Time 13.7 11.4 - 15.2 seconds   INR 1.1 0.8 - 1.2    Comment: (NOTE) INR goal varies based on device and disease states. Performed at [facility], [address] [place], [place] [address]   APTT     Status: None   Collection Time: 02/05/19  1:29 PM  Result Value Ref Range   aPTT 35 24 - 36 seconds    Comment: Performed at [facility], [address] [place], [place] [address]  CBC     Status: None   Collection Time: 02/05/19  1:29 PM  Result Value Ref Range   WBC 9.0 4.0 - 10.5 K/uL   RBC 4.89 4.22 - 5.81 MIL/uL   Hemoglobin 15.0 13.0 - 17.0 g/dL   HCT 84.146.0 32.439.0 - 40.152.0 %   MCV 94.1 80.0 - 100.0 fL   MCH 30.7 26.0 - 34.0 pg   MCHC 32.6 30.0 - 36.0 g/dL   RDW 02.713.4 25.311.5 - 66.415.5 %   Platelets 207 150 - 400 K/uL   nRBC 0.0 0.0 - 0.2 %    Comment: Performed at [facility], [address] [place], [place] [address]  Differential  Status: Abnormal   Collection Time: 02/05/19  1:29 PM  Result Value Ref Range   Neutrophils Relative % 61 %   Neutro Abs 5.4 1.7 - 7.7 K/uL   Lymphocytes Relative 29 %   Lymphs Abs 2.6 0.7 - 4.0 K/uL   Monocytes Relative 7 %   Monocytes Absolute 0.6 0.1 - 1.0 K/uL   Eosinophils Relative 2 %   Eosinophils Absolute 0.2 0.0 - 0.5 K/uL   Basophils Relative 0 %   Basophils Absolute 0.0 0.0 - 0.1 K/uL   Immature Granulocytes 1 %   Abs Immature Granulocytes 0.08 (H) 0.00 - 0.07 K/uL    Comment: Performed at [facility], [address] [place], [place] [address]  Comprehensive metabolic panel     Status: Abnormal   Collection Time: 02/05/19  1:29 PM  Result Value Ref Range   Sodium 140 135 - 145 mmol/L   Potassium 3.2 (L) 3.5 - 5.1 mmol/L    Chloride 105 98 - 111 mmol/L   CO2 23 22 - 32 mmol/L   Glucose, Bld 207 (H) 70 - 99 mg/dL   BUN 15 8 - 23 mg/dL   Creatinine, Ser 0.77 0.61 - 1.24 mg/dL   Calcium 9.0 8.9 - 10.3 mg/dL   Total Protein 6.9 6.5 - 8.1 g/dL   Albumin 4.1 3.5 - 5.0 g/dL   AST 21 15 - 41 U/L   ALT 17 0 - 44 U/L   Alkaline Phosphatase 56 38 - 126 U/L   Total Bilirubin 0.6 0.3 - 1.2 mg/dL   GFR calc non Af Amer >60 >60 mL/min   GFR calc Af Amer >60 >60 mL/min   Anion gap 12 5 - 15    Comment: Performed at [facility], [place], [place] [address]  Troponin I (High Sensitivity)     Status: Abnormal   Collection Time: 02/05/19  1:29 PM  Result Value Ref Range   Troponin I (High Sensitivity) 59 (H) <18 ng/L    Comment: (NOTE) Elevated high sensitivity troponin I (hsTnI) values and significant  changes across serial measurements may suggest ACS but many other  chronic and acute conditions are known to elevate hsTnI results.  Refer to the "Links" section for chest pain algorithms and additional  guidance. Performed at [facility], [address] [place], [place] [address]   Brain natriuretic peptide     Status: Abnormal   Collection Time: 02/05/19  1:29 PM  Result Value Ref Range   B Natriuretic Peptide 510.0 (H) 0.0 - 100.0 pg/mL    Comment: Performed at [facility], [address] [place], [place] [address]  CK     Status: None   Collection Time: 02/05/19  4:55 PM  Result Value Ref Range   Total CK 49 49 - 397 U/L    Comment: Performed at [facility], [address], [place], [place] [address]  SARS Coronavirus 2 (CEPHEID - Performed in [place] hospital lab), Hosp Order     Status: None   Collection Time: 02/05/19  4:55 PM   Specimen: Nasopharyngeal Swab  Result Value Ref Range   SARS Coronavirus 2 NEGATIVE NEGATIVE    Comment: (NOTE) If result is NEGATIVE SARS-CoV-2 target nucleic acids are NOT DETECTED. The SARS-CoV-2 RNA is  generally detectable in upper and lower  respiratory specimens during the acute phase of infection. The lowest  concentration of SARS-CoV-2 viral copies this assay can detect is 250  copies /  mL. A negative result does not preclude SARS-CoV-2 infection  and should not be used as the sole basis for treatment or other  patient management decisions.  A negative result may occur with  improper specimen collection / handling, submission of specimen other  than nasopharyngeal swab, presence of viral mutation(s) within the  areas targeted by this assay, and inadequate number of viral copies  (<250 copies / mL). A negative result must be combined with clinical  observations, patient history, and epidemiological information. If result is POSITIVE SARS-CoV-2 target nucleic acids are DETECTED. The SARS-CoV-2 RNA is generally detectable in upper and lower  respiratory specimens dur ing the acute phase of infection.  Positive  results are indicative of active infection with SARS-CoV-2.  Clinical  correlation with patient history and other diagnostic information is  necessary to determine patient infection status.  Positive results do  not rule out bacterial infection or co-infection with other viruses. If result is PRESUMPTIVE POSTIVE SARS-CoV-2 nucleic acids MAY BE PRESENT.   A presumptive positive result was obtained on the submitted specimen  and confirmed on repeat testing.  While 2019 novel coronavirus  (SARS-CoV-2) nucleic acids may be present in the submitted sample  additional confirmatory testing may be necessary for epidemiological  and / or clinical management purposes  to differentiate between  SARS-CoV-2 and other Sarbecovirus currently known to infect humans.  If clinically indicated additional testing with an alternate test  methodology [phone]([id number]) is advised. The SARS-CoV-2 RNA is generally  detectable in upper and lower respiratory sp ecimens during the acute  phase of  infection. The expected result is Negative. Fact Sheet for Patients:  BoilerBrush.com.cyhttps://www.fda.gov/media/136312/download Fact Sheet for Healthcare Providers: https://pope.com/https://www.fda.gov/media/136313/download This test is not yet approved or cleared by the [place] FDA and has been authorized for detection and/or diagnosis of SARS-CoV-2 by FDA under an Emergency Use Authorization (EUA).  This EUA will remain in effect (meaning this test can be used) for the duration of the COVID-19 declaration under Section 564(b)(1) of the Act, 21 U.S.C. section 360bbb-3(b)(1), unless the authorization is terminated or revoked sooner. Performed at [facility], [address] [place], [place] [address]   Troponin I (High Sensitivity)     Status: Abnormal   Collection Time: 02/05/19  6:28 PM  Result Value Ref Range   Troponin I (High Sensitivity) 58 (H) <18 ng/L    Comment: (NOTE) Elevated high sensitivity troponin I (hsTnI) values and significant  changes across serial measurements may suggest ACS but many other  chronic and acute conditions are known to elevate hsTnI results.  Refer to the "Links" section for chest pain algorithms and additional  guidance. Performed at [facility], [address] [place], [place] [address]    Ct Head Wo Contrast  Result Date: 02/05/2019 CLINICAL DATA:  Fall today with head injury. Intermittent confusion. EXAM: CT HEAD WITHOUT CONTRAST TECHNIQUE: Contiguous axial images were obtained from the base of the skull through the vertex without intravenous contrast. COMPARISON:  CT head 10/31/2014. FINDINGS: Brain: There is no evidence of acute intracranial hemorrhage, mass lesion, brain edema or extra-axial fluid collection. The ventricles and subarachnoid spaces are appropriately sized for age. There are stable mild chronic small vessel ischemic changes in the periventricular white matter. No evidence of acute cortical stroke. Vascular:  No hyperdense vessel  identified. Skull: Negative for fracture or focal lesion. Sinuses/Orbits: Chronic mucosal thickening in the ethmoid and sphenoid sinuses, similar to previous study. The additional paranasal sinuses, mastoid air cells  and middle ears are clear. No significant orbital abnormalities. Other: None. IMPRESSION: 1. No acute intracranial findings. 2. Stable chronic mucosal thickening throughout the ethmoid and sphenoid sinuses. Electronically Signed   By: Carey BullocksWilliam  Veazey M.D.   On: 02/05/2019 14:08    Pending Labs Unresulted Labs (From admission, onward)    Start     Ordered   02/06/19 0500  Heparin level (unfractionated)  Once-Timed,   STAT     02/05/19 2016   02/06/19 0500  CBC  Tomorrow morning,   STAT     02/05/19 2016   02/05/19 1817  Troponin I (High Sensitivity)  STAT Now then every 2 hours,   STAT     02/05/19 1816   Signed and Held  Basic metabolic panel  Tomorrow morning,   R     Signed and Held   Signed and Held  Lipid panel  Tomorrow morning,   R     Signed and Held   Signed and Held  CBC  Tomorrow morning,   R     Signed and Held          Vitals/Pain Today's Vitals   02/05/19 1630 02/05/19 1700 02/05/19 1800 02/05/19 2018  BP: (!) 153/80 (!) 157/88 121/74 128/74  Pulse: 71 75 73 78  Resp: 16 15 19 18   Temp:      TempSrc:      SpO2: 99% 99% 100% 100%  Weight:      Height:      PainSc:    0-No pain    Isolation Precautions No active isolations  Medications Medications  potassium chloride SA (K-DUR) CR tablet 40 mEq (has no administration in time range)  heparin bolus via infusion 4,000 Units (has no administration in time range)  heparin ADULT infusion 100 units/mL (25000 units/22850mL sodium chloride 0.45%) (has no administration in time range)    Mobility walks with device High fall risk   Focused Assessments Cardiac Assessment Handoff:    Lab Results  Component Value Date   CKTOTAL 49 02/05/2019   No results found for: DDIMER Does the Patient currently  have chest pain? No     R Recommendations: See Admitting Provider Note  Report given to:  Tiana RN on 2A  Additional Notes:

## 2019-02-05 NOTE — Progress Notes (Signed)
Advanced care plan. Purpose of the Encounter: CODE STATUS Parties in Attendance: Patient and family Patient's Decision Capacity: Good Subjective/Patient's story: Luke Chen  is a 78 y.o. male with a known history of type 2 diabetes mellitus, hypertension, dementia presented to the emergency room for fall.  Patient is accompanied by family member.  Patient fell down in the bathtub but no loss of consciousness or head injury.  Was worked up with CT head which showed no acute abnormality.  Work-up in the emergency room showed elevated troponin.  No complaints of any chest pain.  No shortness of breath.  CK level has been normal.  Hospitalist service has been consulted. Objective/Medical story Patient needs observation on telemetry and cycling of troponin.  Cardiology consultation and work-up. Goals of care determination:  Advance care directives goals of care and treatment plan discussed Patient wants everything done which includes CPR, intubation ventilator if the need arises CODE STATUS: Full Code Time spent discussing advanced care planning: 16 minutes

## 2019-02-05 NOTE — ED Notes (Signed)
Sent a rainbow to lab  ° °

## 2019-02-05 NOTE — ED Triage Notes (Signed)
Patient's family reports that patient fell from standing this morning when getting some bandages changed. Patient hit side of head on shower door. Patient with some intermittent confusion beyond baseline. Patient was seen by Dr. Lysle Pearl to have laceration sutured, however due to confusion, patient was sent to ED for further evaluation. Stroke screen negative. Patient denies HA.

## 2019-02-06 ENCOUNTER — Observation Stay (HOSPITAL_BASED_OUTPATIENT_CLINIC_OR_DEPARTMENT_OTHER)
Admit: 2019-02-06 | Discharge: 2019-02-06 | Disposition: A | Discharge status: Home / Self Care | Payer: Medicare Other | Attending: Internal Medicine | Admitting: Internal Medicine

## 2019-02-06 ENCOUNTER — Telehealth: Payer: Self-pay | Admitting: Cardiology

## 2019-02-06 DIAGNOSIS — R7989 Other specified abnormal findings of blood chemistry: Secondary | ICD-10-CM | POA: Diagnosis not present

## 2019-02-06 DIAGNOSIS — W19XXXA Unspecified fall, initial encounter: Secondary | ICD-10-CM | POA: Diagnosis not present

## 2019-02-06 DIAGNOSIS — W19XXXS Unspecified fall, sequela: Secondary | ICD-10-CM

## 2019-02-06 DIAGNOSIS — E876 Hypokalemia: Secondary | ICD-10-CM | POA: Diagnosis not present

## 2019-02-06 DIAGNOSIS — R9431 Abnormal electrocardiogram [ECG] [EKG]: Secondary | ICD-10-CM | POA: Diagnosis not present

## 2019-02-06 DIAGNOSIS — E119 Type 2 diabetes mellitus without complications: Secondary | ICD-10-CM | POA: Diagnosis not present

## 2019-02-06 DIAGNOSIS — S0101XA Laceration without foreign body of scalp, initial encounter: Secondary | ICD-10-CM | POA: Diagnosis not present

## 2019-02-06 LAB — BASIC METABOLIC PANEL
Anion gap: 9 (ref 5–15)
BUN: 13 mg/dL (ref 8–23)
CO2: 25 mmol/L (ref 22–32)
Calcium: 8.4 mg/dL — ABNORMAL LOW (ref 8.9–10.3)
Chloride: 108 mmol/L (ref 98–111)
Creatinine, Ser: 0.65 mg/dL (ref 0.61–1.24)
GFR calc Af Amer: >60 mL/min (ref >60)
GFR calc non Af Amer: >60 mL/min (ref >60)
Glucose, Bld: 115 mg/dL — ABNORMAL HIGH (ref 70–99)
Potassium: 3.4 mmol/L — ABNORMAL LOW (ref 3.5–5.1)
Sodium: 142 mmol/L (ref 135–145)

## 2019-02-06 LAB — LIPID PANEL
Cholesterol: 121 mg/dL (ref 0–200)
HDL: 51 mg/dL (ref >40)
LDL Cholesterol: 54 mg/dL (ref 0–99)
Total CHOL/HDL Ratio: 2.4 RATIO
Triglycerides: 81 mg/dL (ref <150)
VLDL: 16 mg/dL (ref 0–40)

## 2019-02-06 LAB — CBC
HCT: 41.5 % (ref 39.0–52.0)
Hemoglobin: 13.7 g/dL (ref 13.0–17.0)
MCH: 30.5 pg (ref 26.0–34.0)
MCHC: 33 g/dL (ref 30.0–36.0)
MCV: 92.4 fL (ref 80.0–100.0)
Platelets: 173 10*3/uL (ref 150–400)
RBC: 4.49 MIL/uL (ref 4.22–5.81)
RDW: 13.4 % (ref 11.5–15.5)
WBC: 7 10*3/uL (ref 4.0–10.5)
nRBC: 0 % (ref 0.0–0.2)

## 2019-02-06 LAB — HEPARIN LEVEL (UNFRACTIONATED): Heparin Unfractionated: 0.24 IU/mL — ABNORMAL LOW (ref 0.30–0.70)

## 2019-02-06 LAB — ECHOCARDIOGRAM COMPLETE
Height: 67 in
Weight: 2619.2 oz

## 2019-02-06 MED ORDER — LISINOPRIL 5 MG PO TABS: 5.0000 mg | ORAL_TABLET | Freq: Every day | ORAL | Status: DC

## 2019-02-06 MED ORDER — POTASSIUM CHLORIDE CRYS ER 20 MEQ PO TBCR
40.0000 meq | EXTENDED_RELEASE_TABLET | Freq: Once | ORAL | Status: AC
  Administered 2019-02-06: 40 meq via ORAL
  Filled 2019-02-06: qty 2

## 2019-02-06 MED ORDER — HEPARIN BOLUS VIA INFUSION
1100.0000 [IU] | Freq: Once | INTRAVENOUS | Status: AC
  Administered 2019-02-06: 1100 [IU] via INTRAVENOUS
  Filled 2019-02-06: qty 1100

## 2019-02-06 MED ORDER — ENOXAPARIN SODIUM 40 MG/0.4ML ~~LOC~~ SOLN: 40.0000 mg | Freq: Once | SUBCUTANEOUS | Status: DC

## 2019-02-06 MED ORDER — DONEPEZIL HCL 5 MG PO TABS
10.0000 mg | ORAL_TABLET | Freq: Every day | ORAL | Status: DC
  Filled 2019-02-06: qty 2

## 2019-02-06 MED ORDER — ASPIRIN 81 MG PO TBEC: 81.0000 mg | DELAYED_RELEASE_TABLET | Freq: Every day | ORAL | 0 refills | Status: DC

## 2019-02-06 NOTE — Plan of Care (Signed)
Pt ready for discharge home.   Problem: Education: Goal: Knowledge of General Education information will improve Description: Including pain rating scale, medication(s)/side effects and non-pharmacologic comfort measures Outcome: Completed/Met   Problem: Health Behavior/Discharge Planning: Goal: Ability to manage health-related needs will improve Outcome: Completed/Met   Problem: Clinical Measurements: Goal: Ability to maintain clinical measurements within normal limits will improve Outcome: Completed/Met Goal: Will remain free from infection Outcome: Completed/Met Goal: Diagnostic test results will improve Outcome: Completed/Met Goal: Respiratory complications will improve Outcome: Completed/Met Goal: Cardiovascular complication will be avoided Outcome: Completed/Met   Problem: Activity: Goal: Risk for activity intolerance will decrease Outcome: Completed/Met   Problem: Nutrition: Goal: Adequate nutrition will be maintained Outcome: Completed/Met   Problem: Coping: Goal: Level of anxiety will decrease Outcome: Completed/Met   Problem: Elimination: Goal: Will not experience complications related to bowel motility Outcome: Completed/Met Goal: Will not experience complications related to urinary retention Outcome: Completed/Met   Problem: Pain Managment: Goal: General experience of comfort will improve Outcome: Completed/Met   Problem: Safety: Goal: Ability to remain free from injury will improve Outcome: Completed/Met   Problem: Skin Integrity: Goal: Risk for impaired skin integrity will decrease Outcome: Completed/Met

## 2019-02-06 NOTE — Progress Notes (Signed)
*  PRELIMINARY RESULTS* Echocardiogram 2D Echocardiogram has been performed.  Luke Chen 02/06/2019, 2:23 PM

## 2019-02-06 NOTE — TOC Transition Note (Signed)
Transition of Care [facility]) - CM/SW Discharge Note   Patient Details  Name: Luke Chen MRN: [id number] Date of Birth: 01-Feb-1941  Transition of Care [facility]) CM/SW Contact:  Elza Rafter, RN Phone Number: 02/06/2019, 12:10 PM   Clinical Narrative:   Patient is from home with wife.  Admitted with a fall.  He is very active at home.  Rides his bike and mows the lawn.  Current with his PCP; obtains medications without difficulty.  No issues with transportation.  Wife is at bedside.  Offered home health services and they decline.  No further needs identified at this time by CM.    Final next level of care: Home/Self Care Barriers to Discharge: No Barriers Identified   Patient Goals and CMS Choice        Discharge Placement                       Discharge Plan and Services                                     Social Determinants of Health (SDOH) Interventions     Readmission Risk Interventions No flowsheet data found.

## 2019-02-06 NOTE — Progress Notes (Signed)
I attempted to speak to Luke Chen after I read the results of his echocardiogram, but he had been discharged.   Echo shows reduced EF in all apical walls. While this is suggestive of takotsubo, it cannot be diagnosed without exclusion of CAD as cause.   I will attempt to contact the patient and his wife to discuss the results. We will get him followed up in the office.  Buford Dresser, MD, PhD [facility]  [address], [place], [place] [address] [phone]

## 2019-02-06 NOTE — Consult Note (Addendum)
Cardiology Consultation:   Patient ID: Luke Chen; [id number]; 06-10-1941   Admit date: 02/05/2019 Date of Consult: 02/06/2019  Primary Care Provider: Corky DownsMasoud, Javed, MD Primary Cardiologist: [facility] - consult by Dr. Cristal Deerhristopher   Patient Profile:   Luke Chen is a 78 y.o. male with a hx of dementia, DM, HTN who is being seen today for the evaluation of elevated troponin at the request of Dr. Tobi BastosPyreddy.  History of Present Illness:   Luke Chen has no known previous cardiac history. Prior echo from [facility] in 2013 showed normal LVSF without significant valvular abnormality.   Patient was taking a bath when his foot stepped on a board so causing him to slip and fall.  He fell through the shower door leading to a significant laceration along his scalp.  Both patient and wife indicate the patient was at his baseline prior to this fall.  He denies any chest pain, shortness of breath, palpitations, dizziness, presyncope, or syncope.  Patient actually rides his bike every evening without any symptoms concerning for angina.  He just mowed 1 acre of grass without issues.  [facility] they were found to have BP in the 150s systolic. EKG showed sinus rhythm with anterolateral TWI as below, CXR was not performed. CT head without acute finding. Labs showed high-sensitivity troponin 59-->58-->66, BNP 510, CK 49, K+ 3.2-->3.4, SCr 0.77, AST/ALT normal, CBC unremarkable, COVID-19 negative, LDL 54. IN the ED, he was given ASA 324 mg x 1, IV fluids, KCl, and started on heparin gtt. Upon admission, cardiology was asked to see. Currently, without cardiac complaint.  Past Medical History:  Diagnosis Date   Dementia (HCC)    Diabetes mellitus without complication (HCC)    Hypertension     History reviewed. No pertinent surgical history.   Home Meds: Prior to Admission medications   Medication Sig Start Date End Date Taking? Authorizing Provider  donepezil (ARICEPT) 10 MG  tablet Take 10 mg by mouth daily. 10/28/18  Yes [provider]  glipiZIDE (GLUCOTROL XL) 5 MG 24 hr tablet Take 5 mg by mouth daily. 01/02/19  Yes [provider]  lisinopril (ZESTRIL) 5 MG tablet Take 5 mg by mouth daily. 11/07/18  Yes [provider]  lovastatin (MEVACOR) 20 MG tablet Take 20 mg by mouth every evening. 11/21/18  Yes [provider]  metFORMIN (GLUCOPHAGE) 1000 MG tablet Take 1,000 mg by mouth 2 (two) times a day. 10/30/18  Yes [provider]  ondansetron (ZOFRAN ODT) 4 MG disintegrating tablet Take 1 tablet (4 mg total) by mouth every 8 (eight) hours as needed for nausea or vomiting. 05/16/17   Nita SickleVeronese, [place], MD    Inpatient Medications: Scheduled Meds:  aspirin EC  81 mg Oral Daily   atorvastatin  40 mg Oral q1800   carvedilol  3.125 mg Oral BID WC   Continuous Infusions:  sodium chloride 75 mL/hr at 02/06/19 0648   heparin 900 Units/hr (02/06/19 0648)   PRN Meds: acetaminophen, nitroGLYCERIN, ondansetron (ZOFRAN) IV  Allergies:  No Known Allergies  Social History:   Social History   Socioeconomic History   Marital status: Unknown    Spouse name: Not on file   Number of children: Not on file   Years of education: Not on file   Highest education level: Not on file  Occupational History   Not on file  Social Needs   Financial resource strain: Not on file   Food insecurity  Worry: Not on file    Inability: Not on file   Transportation needs    Medical: Not on file    Non-medical: Not on file  Tobacco Use   Smoking status: Never Smoker   Smokeless tobacco: Never Used  Substance and Sexual Activity   Alcohol use: Never    Frequency: Never   Drug use: Never   Sexual activity: Not Currently  Lifestyle   Physical activity    Days per week: Not on file    Minutes per session: Not on file   Stress: Not on file  Relationships   Social connections    Talks on phone: Not on file    Gets together: Not on  file    Attends religious service: Not on file    Active member of club or organization: Not on file    Attends meetings of clubs or organizations: Not on file    Relationship status: Not on file   Intimate partner violence    Fear of current or ex partner: Not on file    Emotionally abused: Not on file    Physically abused: Not on file    Forced sexual activity: Not on file  Other Topics Concern   Not on file  Social History Narrative   Not on file     Family History:   History reviewed. No pertinent family history. Unable to perform any history secondary to underlying dementia  ROS:  Review of Systems  Unable to perform ROS: Dementia      Physical Exam/Data:   Vitals:   02/05/19 1800 02/05/19 2018 02/05/19 2140 02/06/19 0429  BP: 121/74 128/74 (!) 169/90 117/66  Pulse: 73 78 85 69  Resp: 19 18 20 20   Temp:   98.4 F (36.9 C) 97.6 F (36.4 C)  TempSrc:   Oral Oral  SpO2: 100% 100% 99% 95%  Weight:   74.3 kg   Height:        Intake/Output Summary (Last 24 hours) at 02/06/2019 0732 Last data filed at 02/06/2019 0648 Gross per 24 hour  Intake 761.7 ml  Output 450 ml  Net 311.7 ml   Filed Weights   02/05/19 1315 02/05/19 2140  Weight: 78 kg 74.3 kg   Body mass index is 25.64 kg/m.   Physical Exam: General: Well developed, well nourished, in no acute distress. Head: Normocephalic, dressing noted along the left parietal scalp, sclera non-icteric, no xanthomas, nares without discharge.  Neck: Negative for carotid bruits. JVD not elevated. Lungs: Clear bilaterally to auscultation without wheezes, rales, or rhonchi. Breathing is unlabored. Heart: RRR with S1 S2. No murmurs, rubs, or gallops appreciated. Abdomen: Soft, non-tender, non-distended with normoactive bowel sounds. No hepatomegaly. No rebound/guarding. No obvious abdominal masses. Msk:  Strength and tone appear normal for age. Extremities: No clubbing or cyanosis. No edema. Distal pedal pulses are 2+ and  equal bilaterally. Neuro: Alert and oriented X 3. No facial asymmetry. No focal deficit. Moves all extremities spontaneously. Psych:  Responds to questions appropriately with a normal affect.   EKG:  The EKG was personally reviewed and demonstrates: NSR, 74 bpm, rare PAC/PVC, anterolateral TWI Telemetry:  Telemetry was personally reviewed and demonstrates: SR, occasional PVCs, rare ventricular couplets  Weights: Filed Weights   02/05/19 1315 02/05/19 2140  Weight: 78 kg 74.3 kg    Relevant CV Studies: 2D Echo 2013: EF > 55%, no significant valvular abnormalities   Laboratory Data:  Chemistry Recent Labs  Lab 02/05/19 1329 02/06/19 0601  NA 140 142  K 3.2* 3.4*  CL 105 108  CO2 23 25  GLUCOSE 207* 115*  BUN 15 13  CREATININE 0.77 0.65  CALCIUM 9.0 8.4*  GFRNONAA >60 >60  GFRAA >60 >60  ANIONGAP 12 9    Recent Labs  Lab 02/05/19 1329  PROT 6.9  ALBUMIN 4.1  AST 21  ALT 17  ALKPHOS 56  BILITOT 0.6   Hematology Recent Labs  Lab 02/05/19 1329 02/06/19 0601  WBC 9.0 7.0  RBC 4.89 4.49  HGB 15.0 13.7  HCT 46.0 41.5  MCV 94.1 92.4  MCH 30.7 30.5  MCHC 32.6 33.0  RDW 13.4 13.4  PLT 207 173   Cardiac EnzymesNo results for input(s): TROPONINI in the last 168 hours. No results for input(s): TROPIPOC in the last 168 hours.  BNP Recent Labs  Lab 02/05/19 1329  BNP 510.0*    DDimer No results for input(s): DDIMER in the last 168 hours.  Radiology/Studies:  Ct Head Wo Contrast  Result Date: 02/05/2019 IMPRESSION: 1. No acute intracranial findings. 2. Stable chronic mucosal thickening throughout the ethmoid and sphenoid sinuses. Electronically Signed   By: Carey BullocksWilliam  Veazey M.D.   On: 02/05/2019 14:08    Assessment and Plan:   1.  Elevated high-sensitivity troponin: -Troponin is minimally elevated, without delta increase and not consistent with ACS.  When compared to prior opponent test this is approximately a troponin value of 0.05-0.06 -Patient is very  active at baseline without any symptoms concerning for angina leading up to his mechanical fall.  He rides his bike every evening for 10 to 15 minutes as well as just recently mowed 1 acre of land along with bleeding -Discontinue heparin -Check echo, if this is unrevealing no plans for inpatient cardiac work-up especially given the patient's underlying dementia -Continue aspirin, Coreg, Lipitor inpatient pending results of echo  2.  Mechanical fall: -Per IM   For questions or updates, please contact [facility] Please consult [email or web address] for contact info under Cardiology/STEMI.   Signed, Eula Listenyan Ervey Fallin, PA-C [facility] Pager: [phone] 02/06/2019, 7:32 AM

## 2019-02-06 NOTE — Consult Note (Signed)
[place] Nurse wound consult note Reason for Consult: Patient with recent I & D to back.  Wife has been packing at home.  Will add orders for bedside RN to perform daily.  Wound type:Infectious cyst  I & D Pressure Injury POA: NA Measurement: 1 cm round I & D site Wound QBV:QXIHWT to see Drainage (amount, consistency, odor) minimal serosanguinous  No odor Periwound:erythema Dressing procedure/placement/frequency: Cleanse wound to pack with NS and pat dry.  Gently fill wound bed with packing strip.  Cover with dry dressing and tape.  Change daily.  Will not follow at this time.  Please re-consult if needed.  Domenic Moras MSN, RN, FNP-BC CWON Wound, Ostomy, Continence Nurse Pager [phone]

## 2019-02-06 NOTE — Telephone Encounter (Signed)
I attempted to call Mr Debruin (and his wife, listed as same contact number) x3 to discuss the results of the echocardiogram. He was discharged prior to my being able to review it with them. I attempted 3 calls on two different phone lines, and none were able to connect.  We will attempt to reach him to set up an outpatient cardiology visit to discuss his test results.  Buford Dresser, MD, PhD [facility]  [address], [place], [place] [address] [phone]

## 2019-02-06 NOTE — Consult Note (Signed)
[place] for Heparin gtt Indication: ACS / STEMI  No Known Allergies  Patient Measurements: Height: 5\' 7"  (170.2 cm) Weight: 163 lb 11.2 oz (74.3 kg) IBW/kg (Calculated) : 66.1 Heparin Dosing Weight: 78 kg  Vital Signs: Temp: 97.6 F (36.4 C) (06/25 0429) Temp Source: Oral (06/25 0429) BP: 117/66 (06/25 0429) Pulse Rate: 69 (06/25 0429)  Labs: Recent Labs    02/05/19 1329 02/05/19 1655 02/06/19 0601  HGB 15.0  --  13.7  HCT 46.0  --  41.5  PLT 207  --  173  APTT 35  --   --   LABPROT 13.7  --   --   INR 1.1  --   --   HEPARINUNFRC  --   --  0.24*  CREATININE 0.77  --   --   CKTOTAL  --  49  --     Estimated Creatinine Clearance: 74.6 mL/min (by C-G formula based on SCr of 0.77 mg/dL).   Medications:  No PTA anticoagulants - confirmed by patient.   Assessment: Mr. Maslow is 77 YOM who present with confusion 2/2 to a head injury.  Heparin DW: 78 kg Hgb 15  RBC 4.89  6/24 Heparin infusion started 900 units/hr 6/25 @ 0600 HL: 0.24. Level subtherapeutic.   Goal of Therapy:  Heparin level 0.3-0.7 units/ml Monitor platelets by anticoagulation protocol: Yes   Plan:  6/25 @ 0600 HL: 0.24. Level subtherapeutic.  Will order heparin 1100 unit bolus and increase heparin infusion rate to 1050 units/hr Recheck anti-Xa level in 6 hours and daily while on heparin, per protocol Continue to monitor H&H and platelets  Pernell Dupre, PharmD, BCPS Clinical Pharmacist 02/06/2019 6:36 AM

## 2019-02-06 NOTE — Discharge Summary (Signed)
[facility] [place] [facility]   PATIENT NAME: Luke Chen    MR#:  [id number]  DATE OF BIRTH:  Jul 30, 1941  DATE OF ADMISSION:  02/05/2019 ADMITTING PHYSICIAN: Ihor AustinPavan Pyreddy, MD  DATE OF DISCHARGE: 02/06/2019  PRIMARY CARE PHYSICIAN: Corky DownsMasoud, Javed, MD    ADMISSION DIAGNOSIS:  Elevated troponin [R79.89] Injury of head, initial encounter [S09.90XA] Fall, initial encounter [W19.XXXA]  DISCHARGE DIAGNOSIS:   left  scalp laceration s/p mechanical fall s/p suturing (In Urgent care) Elevated troponin--w/o cp HTN  SECONDARY DIAGNOSIS:   Past Medical History:  Diagnosis Date  . Dementia (HCC)   . Diabetes mellitus without complication (HCC)   . Hypertension     HOSPITAL COURSE:   Luke Chen  is a 78 y.o. male with a known history of type 2 diabetes mellitus, hypertension, dementia presented to the emergency room for fall.  Patient is accompanied by family member.  Patient fell down in the bathtub but no loss of consciousness.  *Elevated troponin w/o Cp -patient received IV heparin drip which was discontinued by cardiology. Nonspecific rise of troponin. Patient does not have any chest pain. -Cardiology consult with Dr. Cristal Deerhristopher appreciated -no indication for any cardiac workup. Continue home meds. -Check echocardiogram and follow as outpatient -Telemetry monitoring remains in sinus rhythm  *mechanical fall -patient ambulates well. No dizziness or lightheadedness. -He had a laceration on the left scalp. Status post suturing in urgent care. Patient's wife has appointment for suture removal as outpatient.  *Type 2 diabetes mellitus Diabetic diet with sliding scale coverage with insulin -patient will resume home does insulin and oral meds at discharge  * DVT prophylaxis Ambulatory  overall feels at baseline. Discharged to home. Discussed with wife in the room. Patient agreeable. CONSULTS OBTAINED:  Treatment Team:  Jodelle Redhristopher,  Bridgette, MD End, Cristal Deerhristopher, MD  DRUG ALLERGIES:  No Known Allergies  DISCHARGE MEDICATIONS:   Allergies as of 02/06/2019   No Known Allergies     Medication List    TAKE these medications   aspirin 81 MG EC tablet Take 1 tablet (81 mg total) by mouth daily. Start taking on: February 07, 2019   donepezil 10 MG tablet Commonly known as: ARICEPT Take 10 mg by mouth daily.   glipiZIDE 5 MG 24 hr tablet Commonly known as: GLUCOTROL XL Take 5 mg by mouth daily.   lisinopril 5 MG tablet Commonly known as: ZESTRIL Take 5 mg by mouth daily.   lovastatin 20 MG tablet Commonly known as: MEVACOR Take 20 mg by mouth every evening.   metFORMIN 1000 MG tablet Commonly known as: GLUCOPHAGE Take 1,000 mg by mouth 2 (two) times a day.   ondansetron 4 MG disintegrating tablet Commonly known as: Zofran ODT Take 1 tablet (4 mg total) by mouth every 8 (eight) hours as needed for nausea or vomiting.       If you experience worsening of your admission symptoms, develop shortness of breath, life threatening emergency, suicidal or homicidal thoughts you must seek medical attention immediately by calling 911 or calling your MD immediately  if symptoms less severe.  You Must read complete instructions/literature along with all the possible adverse reactions/side effects for all the Medicines you take and that have been prescribed to you. Take any new Medicines after you have completely understood and accept all the possible adverse reactions/side effects.   Please note  You were cared for by a hospitalist during your hospital stay. If you have any questions about your discharge medications  or the care you received while you were in the hospital after you are discharged, you can call the unit and asked to speak with the hospitalist on call if the hospitalist that took care of you is not available. Once you are discharged, your primary care physician will handle any further medical issues.  Please note that NO REFILLS for any discharge medications will be authorized once you are discharged, as it is imperative that you return to your primary care physician (or establish a relationship with a primary care physician if you do not have one) for your aftercare needs so that they can reassess your need for medications and monitor your lab values. Today   SUBJECTIVE   No new complaints.  VITAL SIGNS:  Blood pressure (!) 110/57, pulse 67, temperature 97.6 F (36.4 C), temperature source Oral, resp. rate 16, height 5\' 7"  (1.702 m), weight 74.3 kg, SpO2 97 %.  I/O:    Intake/Output Summary (Last 24 hours) at 02/06/2019 1144 Last data filed at 02/06/2019 1015 Gross per 24 hour  Intake 1001.7 ml  Output 450 ml  Net 551.7 ml    PHYSICAL EXAMINATION:  GENERAL:  78 y.o.-year-old patient lying in the bed with no acute distress.  EYES: Pupils equal, round, reactive to light and accommodation. No scleral icterus. Extraocular muscles intact.  HEENT: Head atraumatic, normocephalic. Oropharynx and nasopharynx clear. Left scalp laceration status post suture old blood. NECK:  Supple, no jugular venous distention. No thyroid enlargement, no tenderness.  LUNGS: Normal breath sounds bilaterally, no wheezing, rales,rhonchi or crepitation. No use of accessory muscles of respiration.  CARDIOVASCULAR: S1, S2 normal. No murmurs, rubs, or gallops.  ABDOMEN: Soft, non-tender, non-distended. Bowel sounds present. No organomegaly or mass.  EXTREMITIES: No pedal edema, cyanosis, or clubbing.  NEUROLOGIC: Cranial nerves II through XII are intact. Muscle strength 5/5 in all extremities. Sensation intact. Gait not checked.  PSYCHIATRIC: The patient is alert and oriented x 3.  SKIN: Infectious cyst  I & D on the back  DATA REVIEW:   CBC  Recent Labs  Lab 02/06/19 0601  WBC 7.0  HGB 13.7  HCT 41.5  PLT 173    Chemistries  Recent Labs  Lab 02/05/19 1329 02/06/19 0601  NA 140 142  K 3.2* 3.4*   CL 105 108  CO2 23 25  GLUCOSE 207* 115*  BUN 15 13  CREATININE 0.77 0.65  CALCIUM 9.0 8.4*  AST 21  --   ALT 17  --   ALKPHOS 56  --   BILITOT 0.6  --     Microbiology Results   Recent Results (from the past 240 hour(s))  SARS Coronavirus 2 (CEPHEID - Performed in [facility]), Hosp Order     Status: None   Collection Time: 02/05/19  4:55 PM   Specimen: Nasopharyngeal Swab  Result Value Ref Range Status   SARS Coronavirus 2 NEGATIVE NEGATIVE Final    Comment: (NOTE) If result is NEGATIVE SARS-CoV-2 target nucleic acids are NOT DETECTED. The SARS-CoV-2 RNA is generally detectable in upper and lower  respiratory specimens during the acute phase of infection. The lowest  concentration of SARS-CoV-2 viral copies this assay can detect is 250  copies / mL. A negative result does not preclude SARS-CoV-2 infection  and should not be used as the sole basis for treatment or other  patient management decisions.  A negative result may occur with  improper specimen collection / handling, submission of specimen other  than nasopharyngeal  swab, presence of viral mutation(s) within the  areas targeted by this assay, and inadequate number of viral copies  (<250 copies / mL). A negative result must be combined with clinical  observations, patient history, and epidemiological information. If result is POSITIVE SARS-CoV-2 target nucleic acids are DETECTED. The SARS-CoV-2 RNA is generally detectable in upper and lower  respiratory specimens dur ing the acute phase of infection.  Positive  results are indicative of active infection with SARS-CoV-2.  Clinical  correlation with patient history and other diagnostic information is  necessary to determine patient infection status.  Positive results do  not rule out bacterial infection or co-infection with other viruses. If result is PRESUMPTIVE POSTIVE SARS-CoV-2 nucleic acids MAY BE PRESENT.   A presumptive positive result was  obtained on the submitted specimen  and confirmed on repeat testing.  While 2019 novel coronavirus  (SARS-CoV-2) nucleic acids may be present in the submitted sample  additional confirmatory testing may be necessary for epidemiological  and / or clinical management purposes  to differentiate between  SARS-CoV-2 and other Sarbecovirus currently known to infect humans.  If clinically indicated additional testing with an alternate test  methodology [id number]) is advised. The SARS-CoV-2 RNA is generally  detectable in upper and lower respiratory sp ecimens during the acute  phase of infection. The expected result is Negative. Fact Sheet for Patients:  BoilerBrush.com.cyhttps://www.fda.gov/media/136312/download Fact Sheet for Healthcare Providers: https://pope.com/https://www.fda.gov/media/136313/download This test is not yet approved or cleared by the [place] FDA and has been authorized for detection and/or diagnosis of SARS-CoV-2 by FDA under an Emergency Use Authorization (EUA).  This EUA will remain in effect (meaning this test can be used) for the duration of the COVID-19 declaration under Section 564(b)(1) of the Act, 21 U.S.C. section 360bbb-3(b)(1), unless the authorization is terminated or revoked sooner. Performed at [facility], [address] [place], [place] [id number]     RADIOLOGY:  Ct Head Wo Contrast  Result Date: 02/05/2019 CLINICAL DATA:  Fall today with head injury. Intermittent confusion. EXAM: CT HEAD WITHOUT CONTRAST TECHNIQUE: Contiguous axial images were obtained from the base of the skull through the vertex without intravenous contrast. COMPARISON:  CT head 10/31/2014. FINDINGS: Brain: There is no evidence of acute intracranial hemorrhage, mass lesion, brain edema or extra-axial fluid collection. The ventricles and subarachnoid spaces are appropriately sized for age. There are stable mild chronic small vessel ischemic changes in the periventricular white matter. No evidence of acute  cortical stroke. Vascular:  No hyperdense vessel identified. Skull: Negative for fracture or focal lesion. Sinuses/Orbits: Chronic mucosal thickening in the ethmoid and sphenoid sinuses, similar to previous study. The additional paranasal sinuses, mastoid air cells and middle ears are clear. No significant orbital abnormalities. Other: None. IMPRESSION: 1. No acute intracranial findings. 2. Stable chronic mucosal thickening throughout the ethmoid and sphenoid sinuses. Electronically Signed   By: Carey BullocksWilliam  Veazey M.D.   On: 02/05/2019 14:08     CODE STATUS:     Code Status Orders  (From admission, onward)         Start     Ordered   02/05/19 2121  Full code  Continuous     02/05/19 2120        Code Status History    This patient has a current code status but no historical code status.   Advance Care Planning Activity    Advance Directive Documentation     Most Recent Value  Type of Advance Directive  Healthcare Power of  Attorney  Pre-existing out of facility DNR order (yellow form or pink MOST form)  -  "MOST" Form in Place?  -      TOTAL TIME TAKING CARE OF THIS PATIENT: *40* minutes.    Fritzi Mandes M.D on 02/06/2019 at 11:44 AM  Between 7am to 6pm - Pager - [phone] After 6pm go to [email or web address] - password [email or web address] [facility]  Office  [phone]  CC: Primary care physician; Cletis Athens, MD

## 2019-02-07 ENCOUNTER — Other Ambulatory Visit: Payer: Self-pay | Admitting: Surgery

## 2019-02-12 ENCOUNTER — Telehealth: Payer: Self-pay | Admitting: Cardiology

## 2019-02-12 DIAGNOSIS — S0101XD Laceration without foreign body of scalp, subsequent encounter: Secondary | ICD-10-CM | POA: Diagnosis not present

## 2019-02-12 DIAGNOSIS — L02212 Cutaneous abscess of back [any part, except buttock]: Secondary | ICD-10-CM | POA: Diagnosis not present

## 2019-02-12 NOTE — Telephone Encounter (Signed)
Will attempt to speak with patient at another time if no call back today

## 2019-02-12 NOTE — Telephone Encounter (Signed)
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  I continue to strongly recommend he follow-up with cardiology.  Please place this in a telephone note. ----- Message ----- From: Clarisse Gouge Sent: 02/12/2019  11:42 AM EDT To: Rise Mu, PA-C Subject: tcm declined                                   Patient wife states nothing wrong with heart and refuses appt.  States there was a mistake in ED and patient does not need fu.  Asked wife to be so kind as to relay this message anyway to the patient .    Thanks  Delsa Sale ----- Message ----- From: Sindy Messing Sent: 02/06/2019   3:57 PM EDT To: Rebeca Alert Burl Scheduling  This patient was prematurely discharged from [facility]. Can we get him in to see one of [place] early next week for TCM follow up?

## 2019-02-13 ENCOUNTER — Telehealth: Payer: Self-pay | Admitting: *Deleted

## 2019-02-13 NOTE — Telephone Encounter (Signed)
No answer. No voicemail. 

## 2019-02-13 NOTE — Telephone Encounter (Signed)
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  I continue to strongly recommend he follow-up with cardiology.  Please place this in a telephone note. °----- Message ----- °From: Harkins, Jennifer B °Sent: 02/12/2019  11:42 AM EDT °To: Ryan M Dunn, PA-C °Subject: tcm declined                                  ° °Patient wife states nothing wrong with heart and refuses appt.  States there was a mistake in ED and patient does not need fu.  Asked wife to be so kind as to relay this message anyway to the patient .   ° °Thanks  °Jen °----- Message ----- °From: Dunn, Ryan M, PA-C °Sent: 02/06/2019   3:57 PM EDT °To: [facility] Scheduling ° °This patient was prematurely discharged from [facility]. Can we get him in to see one of us early next week for TCM follow up? ° ° ° °

## 2019-02-17 DIAGNOSIS — S0101XD Laceration without foreign body of scalp, subsequent encounter: Secondary | ICD-10-CM | POA: Diagnosis not present

## 2019-02-17 DIAGNOSIS — L02212 Cutaneous abscess of back [any part, except buttock]: Secondary | ICD-10-CM | POA: Diagnosis not present

## 2019-02-18 NOTE — Telephone Encounter (Signed)
Patient wife states he is not available and there is nothing wrong with his heart.  Wife advised that we would like the patient to call us back directly to discuss as she is not on dpr.    Wife declining to give the message to patient and states again they were told he does not have a cardiac issue.    Wife states she is strongly confident that her perspective is infact correct and will be declining to give patient a msg.  Call disconnected.   Removing from scheduling pool.

## 2019-02-18 NOTE — Telephone Encounter (Signed)
Letter dictated to be mailed to the patient, via certified mail, indicating we have been unable to reach the patient directly to discuss his care and schedule follow-up appointment.  Once this letter has been signed we request that this also be scanned into epic.  Patient has been directed to contact our office to schedule follow-up appointment.

## 2019-02-19 NOTE — Telephone Encounter (Signed)
Per Christell Faith, PA, letter completed and sent yesterday.   Encounter completed.

## 2019-02-21 DIAGNOSIS — E119 Type 2 diabetes mellitus without complications: Secondary | ICD-10-CM | POA: Diagnosis not present

## 2019-02-21 DIAGNOSIS — I10 Essential (primary) hypertension: Secondary | ICD-10-CM | POA: Diagnosis not present

## 2019-03-05 ENCOUNTER — Encounter: Payer: Self-pay | Admitting: Physician Assistant

## 2019-03-05 ENCOUNTER — Telehealth: Payer: Self-pay

## 2019-03-05 NOTE — Telephone Encounter (Signed)
Encounter entered in error.

## 2019-03-06 NOTE — Telephone Encounter (Signed)
Noted. I sent a certified letter to the patient noting our recommendation for appointment. Can you check and see if this has been signed for, and if so, please have that scanned into his chart?

## 2019-03-06 NOTE — Telephone Encounter (Signed)
Spoke with patients wife at length and she was very frustrated with this whole situation. She states that she was told by the heart doctor in [place] that his heart was fine and that there was nothing wrong. She reports that he has been to his primary care provider and they also told her there was nothing wrong. She stated that he went into ED for cut from a fall and it had nothing to do with his heart so she stated all of this has been so unnecessary. Apologies and emotional support provided to her. Let her know that we just want to make sure nothing is missed. Inquired if she would be interested in setting him up for appointment. She stated "No and if we do then we will be going to [facility]". Advised that she can certainly give [place] a call back if she would like to speak with provider and/or schedule appointment. She verbalized understanding of our conversation and had no further questions or concerns at this time.

## 2019-03-06 NOTE — Telephone Encounter (Signed)
Documents scanned into Epic

## 2019-04-01 DIAGNOSIS — S0101XD Laceration without foreign body of scalp, subsequent encounter: Secondary | ICD-10-CM | POA: Diagnosis not present

## 2019-04-01 DIAGNOSIS — L02212 Cutaneous abscess of back [any part, except buttock]: Secondary | ICD-10-CM | POA: Diagnosis not present

## 2019-04-22 DIAGNOSIS — Z5181 Encounter for therapeutic drug level monitoring: Secondary | ICD-10-CM | POA: Diagnosis not present

## 2019-04-22 DIAGNOSIS — R918 Other nonspecific abnormal finding of lung field: Secondary | ICD-10-CM | POA: Diagnosis not present

## 2019-04-22 DIAGNOSIS — Z7984 Long term (current) use of oral hypoglycemic drugs: Secondary | ICD-10-CM | POA: Diagnosis not present

## 2019-04-22 DIAGNOSIS — Z79899 Other long term (current) drug therapy: Secondary | ICD-10-CM | POA: Diagnosis not present

## 2019-04-22 DIAGNOSIS — U071 COVID-19: Secondary | ICD-10-CM | POA: Diagnosis not present

## 2019-04-26 DIAGNOSIS — J9811 Atelectasis: Secondary | ICD-10-CM | POA: Diagnosis not present

## 2019-04-26 DIAGNOSIS — Z452 Encounter for adjustment and management of vascular access device: Secondary | ICD-10-CM | POA: Diagnosis not present

## 2019-04-26 DIAGNOSIS — J9601 Acute respiratory failure with hypoxia: Secondary | ICD-10-CM | POA: Diagnosis present

## 2019-04-26 DIAGNOSIS — Z79899 Other long term (current) drug therapy: Secondary | ICD-10-CM | POA: Diagnosis not present

## 2019-04-26 DIAGNOSIS — R0902 Hypoxemia: Secondary | ICD-10-CM | POA: Diagnosis not present

## 2019-04-26 DIAGNOSIS — K567 Ileus, unspecified: Secondary | ICD-10-CM | POA: Diagnosis not present

## 2019-04-26 DIAGNOSIS — E119 Type 2 diabetes mellitus without complications: Secondary | ICD-10-CM | POA: Diagnosis not present

## 2019-04-26 DIAGNOSIS — E1110 Type 2 diabetes mellitus with ketoacidosis without coma: Secondary | ICD-10-CM | POA: Diagnosis present

## 2019-04-26 DIAGNOSIS — I10 Essential (primary) hypertension: Secondary | ICD-10-CM | POA: Diagnosis not present

## 2019-04-26 DIAGNOSIS — I110 Hypertensive heart disease with heart failure: Secondary | ICD-10-CM | POA: Diagnosis present

## 2019-04-26 DIAGNOSIS — I38 Endocarditis, valve unspecified: Secondary | ICD-10-CM | POA: Diagnosis not present

## 2019-04-26 DIAGNOSIS — R0602 Shortness of breath: Secondary | ICD-10-CM | POA: Diagnosis not present

## 2019-04-26 DIAGNOSIS — J129 Viral pneumonia, unspecified: Secondary | ICD-10-CM | POA: Diagnosis not present

## 2019-04-26 DIAGNOSIS — N179 Acute kidney failure, unspecified: Secondary | ICD-10-CM | POA: Diagnosis present

## 2019-04-26 DIAGNOSIS — U071 COVID-19: Secondary | ICD-10-CM | POA: Diagnosis not present

## 2019-04-26 DIAGNOSIS — D72829 Elevated white blood cell count, unspecified: Secondary | ICD-10-CM | POA: Diagnosis not present

## 2019-04-26 DIAGNOSIS — J1289 Other viral pneumonia: Secondary | ICD-10-CM | POA: Diagnosis present

## 2019-04-26 DIAGNOSIS — I5022 Chronic systolic (congestive) heart failure: Secondary | ICD-10-CM | POA: Diagnosis present

## 2019-04-26 DIAGNOSIS — J159 Unspecified bacterial pneumonia: Secondary | ICD-10-CM | POA: Diagnosis present

## 2019-04-26 DIAGNOSIS — E785 Hyperlipidemia, unspecified: Secondary | ICD-10-CM | POA: Diagnosis present

## 2019-04-26 DIAGNOSIS — A4189 Other specified sepsis: Secondary | ICD-10-CM | POA: Diagnosis present

## 2019-04-26 DIAGNOSIS — E876 Hypokalemia: Secondary | ICD-10-CM | POA: Diagnosis not present

## 2019-04-26 DIAGNOSIS — R739 Hyperglycemia, unspecified: Secondary | ICD-10-CM | POA: Diagnosis not present

## 2019-04-26 DIAGNOSIS — R509 Fever, unspecified: Secondary | ICD-10-CM | POA: Diagnosis not present

## 2019-04-26 DIAGNOSIS — J80 Acute respiratory distress syndrome: Secondary | ICD-10-CM | POA: Diagnosis not present

## 2019-04-26 DIAGNOSIS — R000 Tachycardia, unspecified: Secondary | ICD-10-CM | POA: Diagnosis not present

## 2019-04-26 DIAGNOSIS — R1312 Dysphagia, oropharyngeal phase: Secondary | ICD-10-CM | POA: Diagnosis not present

## 2019-04-26 DIAGNOSIS — E860 Dehydration: Secondary | ICD-10-CM | POA: Diagnosis present

## 2019-04-26 DIAGNOSIS — R319 Hematuria, unspecified: Secondary | ICD-10-CM | POA: Diagnosis not present

## 2019-04-26 DIAGNOSIS — R918 Other nonspecific abnormal finding of lung field: Secondary | ICD-10-CM | POA: Diagnosis not present

## 2019-04-26 DIAGNOSIS — R001 Bradycardia, unspecified: Secondary | ICD-10-CM | POA: Diagnosis not present

## 2019-04-26 DIAGNOSIS — E43 Unspecified severe protein-calorie malnutrition: Secondary | ICD-10-CM | POA: Diagnosis not present

## 2019-04-26 DIAGNOSIS — R140 Abdominal distension (gaseous): Secondary | ICD-10-CM | POA: Diagnosis not present

## 2019-04-26 DIAGNOSIS — Z4682 Encounter for fitting and adjustment of non-vascular catheter: Secondary | ICD-10-CM | POA: Diagnosis not present

## 2019-04-26 DIAGNOSIS — R6520 Severe sepsis without septic shock: Secondary | ICD-10-CM | POA: Diagnosis not present

## 2019-04-26 DIAGNOSIS — R410 Disorientation, unspecified: Secondary | ICD-10-CM | POA: Diagnosis not present

## 2019-04-26 DIAGNOSIS — F05 Delirium due to known physiological condition: Secondary | ICD-10-CM | POA: Diagnosis not present

## 2019-04-26 DIAGNOSIS — E873 Alkalosis: Secondary | ICD-10-CM | POA: Diagnosis present

## 2019-04-26 DIAGNOSIS — I5020 Unspecified systolic (congestive) heart failure: Secondary | ICD-10-CM | POA: Diagnosis not present

## 2019-04-26 DIAGNOSIS — J90 Pleural effusion, not elsewhere classified: Secondary | ICD-10-CM | POA: Diagnosis not present

## 2019-04-26 DIAGNOSIS — F0390 Unspecified dementia without behavioral disturbance: Secondary | ICD-10-CM | POA: Diagnosis present

## 2019-04-26 DIAGNOSIS — T380X5A Adverse effect of glucocorticoids and synthetic analogues, initial encounter: Secondary | ICD-10-CM | POA: Diagnosis not present

## 2019-04-26 DIAGNOSIS — E870 Hyperosmolality and hypernatremia: Secondary | ICD-10-CM | POA: Diagnosis not present

## 2019-04-26 DIAGNOSIS — Z7984 Long term (current) use of oral hypoglycemic drugs: Secondary | ICD-10-CM | POA: Diagnosis not present

## 2019-04-26 DIAGNOSIS — F329 Major depressive disorder, single episode, unspecified: Secondary | ICD-10-CM | POA: Diagnosis present

## 2019-04-26 DIAGNOSIS — R05 Cough: Secondary | ICD-10-CM | POA: Diagnosis not present

## 2019-04-26 DIAGNOSIS — E0965 Drug or chemical induced diabetes mellitus with hyperglycemia: Secondary | ICD-10-CM | POA: Diagnosis not present

## 2019-04-26 DIAGNOSIS — A419 Sepsis, unspecified organism: Secondary | ICD-10-CM | POA: Diagnosis not present

## 2019-04-26 DIAGNOSIS — E1165 Type 2 diabetes mellitus with hyperglycemia: Secondary | ICD-10-CM | POA: Diagnosis not present

## 2019-04-27 DIAGNOSIS — U071 COVID-19: Secondary | ICD-10-CM | POA: Diagnosis not present

## 2019-04-27 DIAGNOSIS — R05 Cough: Secondary | ICD-10-CM | POA: Diagnosis not present

## 2019-04-27 DIAGNOSIS — E1110 Type 2 diabetes mellitus with ketoacidosis without coma: Secondary | ICD-10-CM | POA: Diagnosis not present

## 2019-04-27 DIAGNOSIS — J1289 Other viral pneumonia: Secondary | ICD-10-CM | POA: Diagnosis not present

## 2019-04-27 DIAGNOSIS — R0902 Hypoxemia: Secondary | ICD-10-CM | POA: Diagnosis not present

## 2019-04-27 DIAGNOSIS — F0390 Unspecified dementia without behavioral disturbance: Secondary | ICD-10-CM | POA: Diagnosis not present

## 2019-04-27 DIAGNOSIS — J9601 Acute respiratory failure with hypoxia: Secondary | ICD-10-CM | POA: Diagnosis not present

## 2019-04-27 DIAGNOSIS — N179 Acute kidney failure, unspecified: Secondary | ICD-10-CM | POA: Diagnosis not present

## 2019-04-27 DIAGNOSIS — I5022 Chronic systolic (congestive) heart failure: Secondary | ICD-10-CM | POA: Diagnosis not present

## 2019-04-28 DIAGNOSIS — T380X5A Adverse effect of glucocorticoids and synthetic analogues, initial encounter: Secondary | ICD-10-CM | POA: Diagnosis not present

## 2019-04-28 DIAGNOSIS — R739 Hyperglycemia, unspecified: Secondary | ICD-10-CM | POA: Diagnosis not present

## 2019-04-28 DIAGNOSIS — U071 COVID-19: Secondary | ICD-10-CM | POA: Diagnosis not present

## 2019-04-28 DIAGNOSIS — J80 Acute respiratory distress syndrome: Secondary | ICD-10-CM | POA: Diagnosis not present

## 2019-04-28 DIAGNOSIS — I5022 Chronic systolic (congestive) heart failure: Secondary | ICD-10-CM | POA: Diagnosis not present

## 2019-04-28 DIAGNOSIS — R410 Disorientation, unspecified: Secondary | ICD-10-CM | POA: Diagnosis not present

## 2019-04-28 DIAGNOSIS — I38 Endocarditis, valve unspecified: Secondary | ICD-10-CM | POA: Diagnosis not present

## 2019-04-29 DIAGNOSIS — I38 Endocarditis, valve unspecified: Secondary | ICD-10-CM | POA: Diagnosis not present

## 2019-04-29 DIAGNOSIS — I5022 Chronic systolic (congestive) heart failure: Secondary | ICD-10-CM | POA: Diagnosis not present

## 2019-04-29 DIAGNOSIS — R410 Disorientation, unspecified: Secondary | ICD-10-CM | POA: Diagnosis not present

## 2019-04-29 DIAGNOSIS — T380X5A Adverse effect of glucocorticoids and synthetic analogues, initial encounter: Secondary | ICD-10-CM | POA: Diagnosis not present

## 2019-04-29 DIAGNOSIS — R739 Hyperglycemia, unspecified: Secondary | ICD-10-CM | POA: Diagnosis not present

## 2019-04-29 DIAGNOSIS — J80 Acute respiratory distress syndrome: Secondary | ICD-10-CM | POA: Diagnosis not present

## 2019-04-29 DIAGNOSIS — U071 COVID-19: Secondary | ICD-10-CM | POA: Diagnosis not present

## 2019-04-30 DIAGNOSIS — I5022 Chronic systolic (congestive) heart failure: Secondary | ICD-10-CM | POA: Diagnosis not present

## 2019-04-30 DIAGNOSIS — I38 Endocarditis, valve unspecified: Secondary | ICD-10-CM | POA: Diagnosis not present

## 2019-04-30 DIAGNOSIS — T380X5A Adverse effect of glucocorticoids and synthetic analogues, initial encounter: Secondary | ICD-10-CM | POA: Diagnosis not present

## 2019-04-30 DIAGNOSIS — R739 Hyperglycemia, unspecified: Secondary | ICD-10-CM | POA: Diagnosis not present

## 2019-04-30 DIAGNOSIS — J80 Acute respiratory distress syndrome: Secondary | ICD-10-CM | POA: Diagnosis not present

## 2019-04-30 DIAGNOSIS — R410 Disorientation, unspecified: Secondary | ICD-10-CM | POA: Diagnosis not present

## 2019-04-30 DIAGNOSIS — U071 COVID-19: Secondary | ICD-10-CM | POA: Diagnosis not present

## 2019-05-01 DIAGNOSIS — E0965 Drug or chemical induced diabetes mellitus with hyperglycemia: Secondary | ICD-10-CM | POA: Diagnosis not present

## 2019-05-01 DIAGNOSIS — E870 Hyperosmolality and hypernatremia: Secondary | ICD-10-CM | POA: Diagnosis not present

## 2019-05-01 DIAGNOSIS — R319 Hematuria, unspecified: Secondary | ICD-10-CM | POA: Diagnosis not present

## 2019-05-01 DIAGNOSIS — I10 Essential (primary) hypertension: Secondary | ICD-10-CM | POA: Diagnosis not present

## 2019-05-01 DIAGNOSIS — I5022 Chronic systolic (congestive) heart failure: Secondary | ICD-10-CM | POA: Diagnosis not present

## 2019-05-01 DIAGNOSIS — U071 COVID-19: Secondary | ICD-10-CM | POA: Diagnosis not present

## 2019-05-01 DIAGNOSIS — A419 Sepsis, unspecified organism: Secondary | ICD-10-CM | POA: Diagnosis not present

## 2019-05-01 DIAGNOSIS — R739 Hyperglycemia, unspecified: Secondary | ICD-10-CM | POA: Diagnosis not present

## 2019-05-01 DIAGNOSIS — F0390 Unspecified dementia without behavioral disturbance: Secondary | ICD-10-CM | POA: Diagnosis not present

## 2019-05-01 DIAGNOSIS — T380X5A Adverse effect of glucocorticoids and synthetic analogues, initial encounter: Secondary | ICD-10-CM | POA: Diagnosis not present

## 2019-05-01 DIAGNOSIS — R410 Disorientation, unspecified: Secondary | ICD-10-CM | POA: Diagnosis not present

## 2019-05-01 DIAGNOSIS — J1289 Other viral pneumonia: Secondary | ICD-10-CM | POA: Diagnosis not present

## 2019-05-01 DIAGNOSIS — R6520 Severe sepsis without septic shock: Secondary | ICD-10-CM | POA: Diagnosis not present

## 2019-05-01 DIAGNOSIS — I5020 Unspecified systolic (congestive) heart failure: Secondary | ICD-10-CM | POA: Diagnosis not present

## 2019-05-01 DIAGNOSIS — J9601 Acute respiratory failure with hypoxia: Secondary | ICD-10-CM | POA: Diagnosis not present

## 2019-05-01 DIAGNOSIS — I38 Endocarditis, valve unspecified: Secondary | ICD-10-CM | POA: Diagnosis not present

## 2019-05-01 DIAGNOSIS — J80 Acute respiratory distress syndrome: Secondary | ICD-10-CM | POA: Diagnosis not present

## 2019-05-02 DIAGNOSIS — J9811 Atelectasis: Secondary | ICD-10-CM | POA: Diagnosis not present

## 2019-05-03 DIAGNOSIS — E1110 Type 2 diabetes mellitus with ketoacidosis without coma: Secondary | ICD-10-CM | POA: Diagnosis not present

## 2019-05-03 DIAGNOSIS — I5020 Unspecified systolic (congestive) heart failure: Secondary | ICD-10-CM | POA: Diagnosis not present

## 2019-05-03 DIAGNOSIS — R319 Hematuria, unspecified: Secondary | ICD-10-CM | POA: Diagnosis not present

## 2019-05-03 DIAGNOSIS — J1289 Other viral pneumonia: Secondary | ICD-10-CM | POA: Diagnosis not present

## 2019-05-03 DIAGNOSIS — I10 Essential (primary) hypertension: Secondary | ICD-10-CM | POA: Diagnosis not present

## 2019-05-03 DIAGNOSIS — F0390 Unspecified dementia without behavioral disturbance: Secondary | ICD-10-CM | POA: Diagnosis not present

## 2019-05-03 DIAGNOSIS — E870 Hyperosmolality and hypernatremia: Secondary | ICD-10-CM | POA: Diagnosis not present

## 2019-05-03 DIAGNOSIS — J129 Viral pneumonia, unspecified: Secondary | ICD-10-CM | POA: Diagnosis not present

## 2019-05-03 DIAGNOSIS — U071 COVID-19: Secondary | ICD-10-CM | POA: Diagnosis not present

## 2019-05-03 DIAGNOSIS — R918 Other nonspecific abnormal finding of lung field: Secondary | ICD-10-CM | POA: Diagnosis not present

## 2019-05-03 DIAGNOSIS — J9601 Acute respiratory failure with hypoxia: Secondary | ICD-10-CM | POA: Diagnosis not present

## 2019-05-26 DIAGNOSIS — F0280 Dementia in other diseases classified elsewhere without behavioral disturbance: Secondary | ICD-10-CM | POA: Diagnosis not present

## 2019-05-26 DIAGNOSIS — E1169 Type 2 diabetes mellitus with other specified complication: Secondary | ICD-10-CM | POA: Diagnosis not present

## 2019-05-26 DIAGNOSIS — G309 Alzheimer's disease, unspecified: Secondary | ICD-10-CM | POA: Diagnosis not present

## 2019-05-26 DIAGNOSIS — E876 Hypokalemia: Secondary | ICD-10-CM | POA: Diagnosis not present

## 2019-10-28 DIAGNOSIS — Z23 Encounter for immunization: Secondary | ICD-10-CM | POA: Diagnosis not present

## 2020-02-02 ENCOUNTER — Other Ambulatory Visit: Payer: Self-pay | Admitting: *Deleted

## 2020-02-02 MED ORDER — GLIPIZIDE ER 5 MG PO TB24: 5.0000 mg | ORAL_TABLET | Freq: Every day | ORAL | 6 refills | Status: DC

## 2020-03-23 ENCOUNTER — Encounter: Payer: Self-pay | Admitting: Internal Medicine

## 2020-03-23 ENCOUNTER — Ambulatory Visit (INDEPENDENT_AMBULATORY_CARE_PROVIDER_SITE_OTHER): Payer: Medicare Other | Admitting: Internal Medicine

## 2020-03-23 ENCOUNTER — Other Ambulatory Visit: Payer: Self-pay

## 2020-03-23 VITALS — BP 140/74 | HR 80 | Ht 67.0 in | Wt 176.2 lb

## 2020-03-23 DIAGNOSIS — G301 Alzheimer's disease with late onset: Secondary | ICD-10-CM

## 2020-03-23 DIAGNOSIS — I10 Essential (primary) hypertension: Secondary | ICD-10-CM | POA: Insufficient documentation

## 2020-03-23 DIAGNOSIS — E1369 Other specified diabetes mellitus with other specified complication: Secondary | ICD-10-CM

## 2020-03-23 DIAGNOSIS — U071 COVID-19: Secondary | ICD-10-CM | POA: Insufficient documentation

## 2020-03-23 DIAGNOSIS — F0280 Dementia in other diseases classified elsewhere without behavioral disturbance: Secondary | ICD-10-CM

## 2020-03-23 DIAGNOSIS — Z8616 Personal history of COVID-19: Secondary | ICD-10-CM | POA: Insufficient documentation

## 2020-03-23 LAB — GLUCOSE, POCT (MANUAL RESULT ENTRY): POC Glucose: 225 mg/dl — AB (ref 70–99)

## 2020-03-23 MED ORDER — ONGLYZA 2.5 MG PO TABS: 2.5000 mg | ORAL_TABLET | Freq: Every day | ORAL | 5 refills | Status: DC

## 2020-03-23 NOTE — Assessment & Plan Note (Signed)
BP  Is stable 

## 2020-03-23 NOTE — Progress Notes (Signed)
Established Patient Office Visit  SUBJECTIVE:  Subjective  Patient ID: Luke Chen, male    DOB: 07/12/1941  Age: 79 y.o. MRN: [id number]  CC:  Chief Complaint  Patient presents with  . Diabetes    general check up for Luke Chen diabeties  . Extremity Weakness    Patient reports Luke Chen has had leg weakness since Luke Chen had COVID almost a year ago. After getting Luke Chen COVID vaccine Luke Chen states the weakness has come back    HPI Luke Chen is a 79 y.o. male presenting today for a diabetes check and evaluation of Luke Chen lower extremity weakness. Luke Chen is here today with Luke Chen wife.   Luke Chen needs a refill on Luke Chen metformin. Luke Chen blood sugar today is 225 nonfasting.   Luke Chen got Luke Chen COVID19 vaccine in 11/2019. Luke Chen wife notes that Luke Chen lower extremity weakness started coming back after the vaccine. Luke Chen has a history of COVID19 in 04/2019, which I when Luke Chen initially developed the extremity weakness. Luke Chen rides a stationary bike 3 times per day, but Luke Chen notes that Luke Chen doesn't feel like Luke Chen leg strength is improving.    Past Medical History:  Diagnosis Date  . Dementia (HCC)   . Diabetes mellitus without complication (HCC)   . Hypertension     No past surgical history on file.  No family history on file.  Social History   Socioeconomic History  . Marital status: Unknown    Spouse name: Not on file  . Number of children: Not on file  . Years of education: Not on file  . Highest education level: Not on file  Occupational History  . Not on file  Tobacco Use  . Smoking status: Never Smoker  . Smokeless tobacco: Never Used  Substance and Sexual Activity  . Alcohol use: Never  . Drug use: Never  . Sexual activity: Not Currently  Other Topics Concern  . Not on file  Social History Narrative  . Not on file   Social Determinants of Health   Financial Resource Strain:   . Difficulty of Paying Living Expenses:   Food Insecurity:   . Worried About Programme researcher, broadcasting/film/video in the Last Year:   . Barista in  the Last Year:   Transportation Needs:   . Freight forwarder (Medical):   Marland Kitchen Lack of Transportation (Non-Medical):   Physical Activity:   . Days of Exercise per Week:   . Minutes of Exercise per Session:   Stress:   . Feeling of Stress :   Social Connections:   . Frequency of Communication with Friends and Family:   . Frequency of Social Gatherings with Friends and Family:   . Attends Religious Services:   . Active Member of Clubs or Organizations:   . Attends Banker Meetings:   Marland Kitchen Marital Status:   Intimate Partner Violence:   . Fear of Current or Ex-Partner:   . Emotionally Abused:   Marland Kitchen Physically Abused:   . Sexually Abused:      Current Outpatient Medications:  .  aspirin EC 81 MG EC tablet, Take 1 tablet (81 mg total) by mouth daily., Disp: 30 tablet, Rfl: 0 .  donepezil (ARICEPT) 10 MG tablet, Take 10 mg by mouth daily., Disp: , Rfl:  .  glipiZIDE (GLUCOTROL XL) 5 MG 24 hr tablet, Take 1 tablet (5 mg total) by mouth daily., Disp: 30 tablet, Rfl: 6 .  lisinopril (ZESTRIL) 5 MG tablet, Take 5 mg by mouth daily., Disp: ,  Rfl:  .  lovastatin (MEVACOR) 20 MG tablet, Take 20 mg by mouth every evening., Disp: , Rfl:  .  metFORMIN (GLUCOPHAGE) 1000 MG tablet, Take 1,000 mg by mouth 2 (two) times a day., Disp: , Rfl:  .  ondansetron (ZOFRAN ODT) 4 MG disintegrating tablet, Take 1 tablet (4 mg total) by mouth every 8 (eight) hours as needed for nausea or vomiting., Disp: 20 tablet, Rfl: 0 .  saxagliptin HCl (ONGLYZA) 2.5 MG TABS tablet, Take 1 tablet (2.5 mg total) by mouth daily., Disp: 30 tablet, Rfl: 5   No Known Allergies  ROS Review of Systems  Constitutional: Negative.   HENT: Negative.   Eyes: Negative.   Respiratory: Negative.   Cardiovascular: Negative.   Gastrointestinal: Negative.   Endocrine: Negative.   Genitourinary: Negative.   Musculoskeletal: Negative.   Skin: Negative.   Allergic/Immunologic: Negative.   Neurological: Positive for  weakness.  Hematological: Negative.   Psychiatric/Behavioral: Positive for confusion (Dementia).  All other systems reviewed and are negative.    OBJECTIVE:    Physical Exam Vitals reviewed.  Constitutional:      Appearance: Normal appearance.  HENT:     Mouth/Throat:     Mouth: Mucous membranes are moist.  Eyes:     Pupils: Pupils are equal, round, and reactive to light.  Neck:     Vascular: No carotid bruit.  Cardiovascular:     Rate and Rhythm: Normal rate and regular rhythm.     Pulses: Normal pulses.     Heart sounds: Normal heart sounds.  Pulmonary:     Effort: Pulmonary effort is normal.     Breath sounds: Normal breath sounds.  Abdominal:     General: Bowel sounds are normal.     Palpations: Abdomen is soft. There is no hepatomegaly, splenomegaly or mass.     Tenderness: There is no abdominal tenderness.     Hernia: No hernia is present.  Musculoskeletal:     Cervical back: Neck supple.     Right lower leg: No edema.     Left lower leg: No edema.  Skin:    Findings: No rash.  Neurological:     Mental Status: Luke Chen is alert and oriented to person, place, and time. Mental status is at baseline.     Motor: No weakness.  Psychiatric:        Mood and Affect: Mood normal.        Behavior: Behavior normal.        Cognition and Memory: Memory is impaired.     BP 140/74   Pulse 80   Ht 5\' 7"  (1.702 m)   Wt 176 lb 3.2 oz (79.9 kg)   BMI 27.60 kg/m  Wt Readings from Last 3 Encounters:  03/23/20 176 lb 3.2 oz (79.9 kg)  02/05/19 163 lb 11.2 oz (74.3 kg)  05/16/17 160 lb (72.6 kg)    Health Maintenance Due  Topic Date Due  . HEMOGLOBIN A1C  Never done  . Hepatitis C Screening  Never done  . FOOT EXAM  Never done  . OPHTHALMOLOGY EXAM  Never done  . COVID-19 Vaccine (1) Never done  . TETANUS/TDAP  Never done  . PNA vac Low Risk Adult (1 of 2 - PCV13) Never done  . INFLUENZA VACCINE  03/14/2020    There are no preventive care reminders to display for this  patient.  CBC Latest Ref Rng & Units 02/06/2019 02/05/2019 05/16/2017  WBC 4.0 - 10.5 K/uL 7.0 9.0 15.0(H)  Hemoglobin 13.0 - 17.0 g/dL 15.9 45.8 59.2  Hematocrit 39 - 52 % 41.5 46.0 46.8  Platelets 150 - 400 K/uL 173 207 185   CMP Latest Ref Rng & Units 02/06/2019 02/05/2019 05/16/2017  Glucose 70 - 99 mg/dL 924(M) 628(M) 381(R)  BUN 8 - 23 mg/dL 13 15 17   Creatinine 0.61 - 1.24 mg/dL 7.11 6.57)  Sodium 135 - 145 mmol/L 142 140 137  Potassium 3.5 - 5.1 mmol/L 3.4(L) 3.2(L) 4.0  Chloride 98 - 111 mmol/L 108 105 99(L)  CO2 22 - 32 mmol/L 25 23 27   Calcium 8.9 - 10.3 mg/dL 9.03(Y) 9.0 9.1  Total Protein 6.5 - 8.1 g/dL - 6.9 6.9  Total Bilirubin 0.3 - 1.2 mg/dL - 0.6 1.0  Alkaline Phos 38 - 126 U/L - 56 59  AST 15 - 41 U/L - 21 23  ALT 0 - 44 U/L - 17 16(L)    No results found for: TSH Lab Results  Component Value Date   ALBUMIN 4.1 02/05/2019   ANIONGAP 9 02/06/2019   Lab Results  Component Value Date   CHOL 121 02/06/2019   HDL 51 02/06/2019   LDLCALC 54 02/06/2019   CHOLHDL 2.4 02/06/2019   Lab Results  Component Value Date   TRIG 81 02/06/2019   No results found for: HGBA1C    ASSESSMENT & PLAN:   Problem List Items Addressed This Visit      Cardiovascular and Mediastinum   Essential hypertension    BP Is stable        Endocrine   Other specified diabetes mellitus with other specified complication (HCC) - Primary    - The patient's blood sugar is not under control on metformin and glipizide alone. - The patient will change the current treatment regimen to add Onglysa 2.5 mg PO daily.  - I encouraged the patient to regularly check blood sugar.  - I encouraged the patient to monitor diet. I encouraged the patient to eat low-carb and low-sugar to help prevent blood sugar spikes.  - I encouraged the patient to continue following their prescribed treatment plan for diabetes - I informed the patient to get help if blood sugar drops below 54mg /dL, or if suddenly  have trouble thinking clearly or breathing.          Relevant Medications   saxagliptin HCl (ONGLYZA) 2.5 MG TABS tablet   Other Relevant Orders   POCT glucose (manual entry) (Completed)     Nervous and Auditory   Late onset Alzheimer's disease without behavioral disturbance (HCC)    Pt has progressive weakness of bilateral legs. It is due to neurodegenerative disorder. Luke Chen was advised to continue exercising on Luke Chen bike because Luke Chen gait is unsteady. Luke Chen has deterioration of Luke Chen symptoms more after COVID19 infection a little over a year ago.         Other   History of 2019 novel coronavirus disease (COVID-19)    According to the wife, pt is not recovering fast after COVID; Luke Chen mental statis has deteriorated after COVID. Luke Chen does not have any wheezing or shortness of breath or agitated behavior. Luke Chen also doesn't seem to be depressed.          Meds ordered this encounter  Medications  . saxagliptin HCl (ONGLYZA) 2.5 MG TABS tablet    Sig: Take 1 tablet (2.5 mg total) by mouth daily.    Dispense:  30 tablet    Refill:  5    Follow-up: Return in about  1 month (around 04/23/2020).    Dr. Woodroe ChenJaved Daesean Lazarz [facility] [address], [place], [place] [address]   By signing my name below, I, [employer], attest that this documentation has been prepared under the direction and in the presence of Corky DownsMasoud, Haniyah Maciolek, MD. Electronically Signed: Corky DownsJaved Kamerin Grumbine, MD 03/23/20, 11:36 AM   I personally performed the services described in this documentation, which was SCRIBED in my presence. The recorded information has been reviewed and considered accurate. It has been edited as necessary during review. Corky DownsJaved Keanon Bevins, MD

## 2020-03-23 NOTE — Assessment & Plan Note (Signed)
-   The patient's blood sugar is not under control on metformin and glipizide alone. - The patient will change the current treatment regimen to add Onglysa 2.5 mg PO daily.  - I encouraged the patient to regularly check blood sugar.  - I encouraged the patient to monitor diet. I encouraged the patient to eat low-carb and low-sugar to help prevent blood sugar spikes.  - I encouraged the patient to continue following their prescribed treatment plan for diabetes - I informed the patient to get help if blood sugar drops below 54mg /dL, or if suddenly have trouble thinking clearly or breathing.

## 2020-03-23 NOTE — Assessment & Plan Note (Signed)
Pt has progressive weakness of bilateral legs. It is due to neurodegenerative disorder. He was advised to continue exercising on his bike because his gait is unsteady. He has deterioration of his symptoms more after COVID19 infection a little over a year ago.

## 2020-03-23 NOTE — Assessment & Plan Note (Signed)
According to the wife, pt is not recovering fast after COVID; his mental statis has deteriorated after COVID. He does not have any wheezing or shortness of breath or agitated behavior. He also doesn't seem to be depressed.

## 2020-03-24 ENCOUNTER — Other Ambulatory Visit: Payer: Self-pay

## 2020-03-24 MED ORDER — METFORMIN HCL 1000 MG PO TABS: 1000.0000 mg | ORAL_TABLET | Freq: Two times a day (BID) | ORAL | 3 refills | Status: DC

## 2020-04-01 ENCOUNTER — Other Ambulatory Visit: Payer: Self-pay | Admitting: *Deleted

## 2020-04-02 MED ORDER — GLIMEPIRIDE 1 MG PO TABS: 1.0000 mg | ORAL_TABLET | Freq: Every day | ORAL | 6 refills | Status: DC

## 2020-04-02 NOTE — Addendum Note (Signed)
Addended by: Jobie Quaker on: 04/02/2020 04:08 PM   Modules accepted: Orders

## 2020-04-23 ENCOUNTER — Ambulatory Visit: Payer: Medicare Other | Admitting: Internal Medicine

## 2020-04-23 ENCOUNTER — Encounter: Payer: Self-pay | Admitting: Internal Medicine

## 2020-04-23 ENCOUNTER — Ambulatory Visit (INDEPENDENT_AMBULATORY_CARE_PROVIDER_SITE_OTHER): Payer: Medicare Other | Admitting: Internal Medicine

## 2020-04-23 ENCOUNTER — Other Ambulatory Visit: Payer: Self-pay

## 2020-04-23 VITALS — BP 128/75 | HR 90 | Ht 67.0 in | Wt 166.6 lb

## 2020-04-23 DIAGNOSIS — E1369 Other specified diabetes mellitus with other specified complication: Secondary | ICD-10-CM

## 2020-04-23 DIAGNOSIS — I10 Essential (primary) hypertension: Secondary | ICD-10-CM | POA: Diagnosis not present

## 2020-04-23 DIAGNOSIS — F0280 Dementia in other diseases classified elsewhere without behavioral disturbance: Secondary | ICD-10-CM

## 2020-04-23 DIAGNOSIS — Z8616 Personal history of COVID-19: Secondary | ICD-10-CM | POA: Diagnosis not present

## 2020-04-23 DIAGNOSIS — G301 Alzheimer's disease with late onset: Secondary | ICD-10-CM

## 2020-04-23 NOTE — Assessment & Plan Note (Signed)
Patient is doing well after Covid.  His chest is clear heart is regular

## 2020-04-23 NOTE — Assessment & Plan Note (Signed)
-   Today, the patient's blood pressure is well managed on on lisinopril. - The patient will continue the current treatment regimen.  - I encouraged the patient to eat a low-sodium diet to help control blood pressure. - I encouraged the patient to live an active lifestyle and complete activities that increases heart rate to 85% target heart rate at least 5 times per week for one hour.

## 2020-04-23 NOTE — Progress Notes (Signed)
Established Patient Office Visit  SUBJECTIVE:  Subjective  Patient ID: Luke Chen, male    DOB: September 22, 1940  Age: 79 y.o. MRN: [id number]  CC:  Chief Complaint  Patient presents with  . Diabetes    patient is here today with concerns of glimepiride causing drops in sugar (was 37 one day) and he feels dizzy. Patient didn't take medication today and sugar s 183.     HPI Luke Chen is a 79 y.o. male presenting today for evaluation of his diabetes. He is here today with his wife.   He notes that he has concerns that his glimepiride is causing his blood sugar to drop too low. In one instance, his blood sugar dropped to 37. He has been feeling dizzy as well. He did not take his medication today and his blood sugar was 183 when tested.   In office, his glucose is 225.    Past Medical History:  Diagnosis Date  . Dementia (HCC)   . Diabetes mellitus without complication (HCC)   . Hypertension     History reviewed. No pertinent surgical history.  History reviewed. No pertinent family history.  Social History   Socioeconomic History  . Marital status: Unknown    Spouse name: Not on file  . Number of children: Not on file  . Years of education: Not on file  . Highest education level: Not on file  Occupational History  . Not on file  Tobacco Use  . Smoking status: Never Smoker  . Smokeless tobacco: Never Used  Substance and Sexual Activity  . Alcohol use: Never  . Drug use: Never  . Sexual activity: Not Currently  Other Topics Concern  . Not on file  Social History Narrative  . Not on file   Social Determinants of Health   Financial Resource Strain:   . Difficulty of Paying Living Expenses: Not on file  Food Insecurity:   . Worried About Programme researcher, broadcasting/film/video in the Last Year: Not on file  . Ran Out of Food in the Last Year: Not on file  Transportation Needs:   . Lack of Transportation (Medical): Not on file  . Lack of Transportation (Non-Medical): Not on file    Physical Activity:   . Days of Exercise per Week: Not on file  . Minutes of Exercise per Session: Not on file  Stress:   . Feeling of Stress : Not on file  Social Connections:   . Frequency of Communication with Friends and Family: Not on file  . Frequency of Social Gatherings with Friends and Family: Not on file  . Attends Religious Services: Not on file  . Active Member of Clubs or Organizations: Not on file  . Attends Banker Meetings: Not on file  . Marital Status: Not on file  Intimate Partner Violence:   . Fear of Current or Ex-Partner: Not on file  . Emotionally Abused: Not on file  . Physically Abused: Not on file  . Sexually Abused: Not on file     Current Outpatient Medications:  .  aspirin EC 81 MG EC tablet, Take 1 tablet (81 mg total) by mouth daily., Disp: 30 tablet, Rfl: 0 .  donepezil (ARICEPT) 10 MG tablet, Take 10 mg by mouth daily., Disp: , Rfl:  .  lisinopril (ZESTRIL) 5 MG tablet, Take 5 mg by mouth daily., Disp: , Rfl:  .  lovastatin (MEVACOR) 20 MG tablet, Take 20 mg by mouth every evening., Disp: , Rfl:  .  metFORMIN (GLUCOPHAGE) 1000 MG tablet, Take 1 tablet (1,000 mg total) by mouth 2 (two) times daily., Disp: 60 tablet, Rfl: 3 .  ondansetron (ZOFRAN ODT) 4 MG disintegrating tablet, Take 1 tablet (4 mg total) by mouth every 8 (eight) hours as needed for nausea or vomiting., Disp: 20 tablet, Rfl: 0   No Known Allergies  ROS Review of Systems  Constitutional: Negative.   HENT: Negative.   Eyes: Negative.   Respiratory: Negative.   Cardiovascular: Negative.   Gastrointestinal: Negative.   Endocrine: Negative.   Genitourinary: Positive for dysuria and frequency.  Musculoskeletal: Negative.   Skin: Negative.   Allergic/Immunologic: Negative.   Neurological: Negative.   Hematological: Negative.   Psychiatric/Behavioral: Negative.   All other systems reviewed and are negative.    OBJECTIVE:    Physical Exam Vitals reviewed.   Constitutional:      Appearance: Normal appearance.  HENT:     Mouth/Throat:     Mouth: Mucous membranes are moist.  Eyes:     Pupils: Pupils are equal, round, and reactive to light.  Neck:     Vascular: No carotid bruit.  Cardiovascular:     Rate and Rhythm: Normal rate and regular rhythm.     Pulses: Normal pulses.     Heart sounds: Normal heart sounds.  Pulmonary:     Effort: Pulmonary effort is normal.     Breath sounds: Normal breath sounds.  Abdominal:     General: Bowel sounds are normal.     Palpations: Abdomen is soft. There is no hepatomegaly, splenomegaly or mass.     Tenderness: There is no abdominal tenderness.     Hernia: No hernia is present.  Musculoskeletal:     Cervical back: Neck supple.     Right lower leg: No edema.     Left lower leg: No edema.  Skin:    Findings: No rash.  Neurological:     Mental Status: He is alert and oriented to person, place, and time.     Motor: No weakness.  Psychiatric:        Mood and Affect: Mood normal.        Behavior: Behavior normal.     There were no vitals taken for this visit. Wt Readings from Last 3 Encounters:  03/23/20 176 lb 3.2 oz (79.9 kg)  02/05/19 163 lb 11.2 oz (74.3 kg)  05/16/17 160 lb (72.6 kg)    Health Maintenance Due  Topic Date Due  . HEMOGLOBIN A1C  Never done  . Hepatitis C Screening  Never done  . FOOT EXAM  Never done  . OPHTHALMOLOGY EXAM  Never done  . COVID-19 Vaccine (1) Never done  . TETANUS/TDAP  Never done  . PNA vac Low Risk Adult (1 of 2 - PCV13) Never done  . INFLUENZA VACCINE  Never done    There are no preventive care reminders to display for this patient.  CBC Latest Ref Rng & Units 02/06/2019 02/05/2019 05/16/2017  WBC 4.0 - 10.5 K/uL 7.0 9.0 15.0(H)  Hemoglobin 13.0 - 17.0 g/dL 82.5 05.3 97.6  Hematocrit 39 - 52 % 41.5 46.0 46.8  Platelets 150 - 400 K/uL 173 207 185   CMP Latest Ref Rng & Units 02/06/2019 02/05/2019 05/16/2017  Glucose 70 - 99 mg/dL 734(L) 937(T)  024(O)  BUN 8 - 23 mg/dL 13 15 17   Creatinine 0.61 - 1.24 mg/dL 9.73 5.32)  Sodium 135 - 145 mmol/L 142 140 137  Potassium 3.5 - 5.1  mmol/L 3.4(L) 3.2(L) 4.0  Chloride 98 - 111 mmol/L 108 105 99(L)  CO2 22 - 32 mmol/L 25 23 27   Calcium 8.9 - 10.3 mg/dL ) 9.0 9.1  Total Protein 6.5 - 8.1 g/dL - 6.9 6.9  Total Bilirubin 0.3 - 1.2 mg/dL - 0.6 1.0  Alkaline Phos 38 - 126 U/L - 56 59  AST 15 - 41 U/L - 21 23  ALT 0 - 44 U/L - 17 16(L)    No results found for: TSH Lab Results  Component Value Date   ALBUMIN 4.1 02/05/2019   ANIONGAP 9 02/06/2019   Lab Results  Component Value Date   CHOL 121 02/06/2019   HDL 51 02/06/2019   LDLCALC 54 02/06/2019   CHOLHDL 2.4 02/06/2019   Lab Results  Component Value Date   TRIG 81 02/06/2019   No results found for: HGBA1C    ASSESSMENT & PLAN:   Problem List Items Addressed This Visit      Cardiovascular and Mediastinum   Essential hypertension    - Today, the patient's blood pressure is well managed on on lisinopril. - The patient will continue the current treatment regimen.  - I encouraged the patient to eat a low-sodium diet to help control blood pressure. - I encouraged the patient to live an active lifestyle and complete activities that increases heart rate to 85% target heart rate at least 5 times per week for one hour.            Endocrine   Other specified diabetes mellitus with other specified complication (HCC)    Blood sugar is labile on Metformin and sulfonylurea.  I told him to stop using any glimepiride or glipizide , use Metformin 1000 mg p.o. twice a day.  Advised to keep a diary of the blood sugar and bring it back to me in 2 weeks        Nervous and Auditory   Late onset Alzheimer's disease without behavioral disturbance (HCC) - Primary    Patient Alzheimer disease is not getting worse.  He can recall the president he  also know the distance between [facility] and [place]        Other    History of 2019 novel coronavirus disease (COVID-19)    Patient is doing well after Covid.  His chest is clear heart is regular         No orders of the defined types were placed in this encounter.   Follow-up: Return in about 2 weeks (around 05/07/2020) for Follow Up.    05/09/2020, MD [facility] [address] [place], [place]   By signing my name below, I, [id number], attest that this documentation has been prepared under the direction and in the presence of Dr. [employer] Electronically Signed: [name], MD 04/23/20, 2:59 PM I personally performed the services described in this documentation, which was SCRIBED in my presence. The recorded information has been reviewed and considered accurate. It has been edited as necessary during review. 06/23/20, MD

## 2020-04-23 NOTE — Assessment & Plan Note (Signed)
Patient Alzheimer disease is not getting worse.  He can recall the president he  also know the distance between [place] and [place]

## 2020-04-23 NOTE — Assessment & Plan Note (Signed)
Blood sugar is labile on Metformin and sulfonylurea.  I told him to stop using any glimepiride or glipizide , use Metformin 1000 mg p.o. twice a day.  Advised to keep a diary of the blood sugar and bring it back to me in 2 weeks

## 2020-05-07 ENCOUNTER — Encounter: Payer: Self-pay | Admitting: Internal Medicine

## 2020-05-07 ENCOUNTER — Ambulatory Visit (INDEPENDENT_AMBULATORY_CARE_PROVIDER_SITE_OTHER): Payer: Medicare Other | Admitting: Internal Medicine

## 2020-05-07 ENCOUNTER — Other Ambulatory Visit: Payer: Self-pay

## 2020-05-07 VITALS — BP 144/77 | HR 64 | Ht 67.0 in | Wt 157.0 lb

## 2020-05-07 DIAGNOSIS — F0280 Dementia in other diseases classified elsewhere without behavioral disturbance: Secondary | ICD-10-CM

## 2020-05-07 DIAGNOSIS — E139 Other specified diabetes mellitus without complications: Secondary | ICD-10-CM | POA: Diagnosis not present

## 2020-05-07 DIAGNOSIS — Z8616 Personal history of COVID-19: Secondary | ICD-10-CM

## 2020-05-07 DIAGNOSIS — G301 Alzheimer's disease with late onset: Secondary | ICD-10-CM | POA: Diagnosis not present

## 2020-05-07 DIAGNOSIS — E1369 Other specified diabetes mellitus with other specified complication: Secondary | ICD-10-CM | POA: Diagnosis not present

## 2020-05-07 DIAGNOSIS — I10 Essential (primary) hypertension: Secondary | ICD-10-CM

## 2020-05-07 LAB — GLUCOSE, POCT (MANUAL RESULT ENTRY): POC Glucose: 201 mg/dl — AB (ref 70–99)

## 2020-05-07 MED ORDER — GLIMEPIRIDE 2 MG PO TABS: 2.0000 mg | ORAL_TABLET | Freq: Every day | ORAL | 3 refills | Status: DC

## 2020-05-07 NOTE — Progress Notes (Signed)
Patient ID: Luke Chen [place], male   DOB: 01-25-1941, 79 y.o.   MRN: [id number]    Established Patient Office Visit  Subjective:  Patient ID: Luke Chen [place], male    DOB: 01-25-1941  Age: 79 y.o. MRN: [id number]  CC:  Chief Complaint  Patient presents with  . Diabetes    Patient is here today for a 2 week follow up from a medication change. Patient has a blood sugar log of the past 2 weeks with him.    HPI  Luke Chen [place] presents for a follow up regarding his diabetes. He has been taking Metformin as prescribed. His fasting blood sugars over the past 2 weeks have been between 176 - 205. His wife has been trying to watch what he eats. He has had diabetes since 2000. Denies shortness of breath and chest pain.  He and his wife both received the COVID-19 vaccine.  Past Medical History:  Diagnosis Date  . Dementia (HCC)   . Diabetes mellitus without complication (HCC)   . Hypertension     History reviewed. No pertinent surgical history.  History reviewed. No pertinent family history.  Social History   Socioeconomic History  . Marital status: Unknown    Spouse name: Not on file  . Number of children: Not on file  . Years of education: Not on file  . Highest education level: Not on file  Occupational History  . Not on file  Tobacco Use  . Smoking status: Never Smoker  . Smokeless tobacco: Never Used  Substance and Sexual Activity  . Alcohol use: Never  . Drug use: Never  . Sexual activity: Not Currently  Other Topics Concern  . Not on file  Social History Narrative  . Not on file   Social Determinants of Health   Financial Resource Strain:   . Difficulty of Paying Living Expenses: Not on file  Food Insecurity:   . Worried About Programme researcher, broadcasting/film/videounning Out of Food in the Last Year: Not on file  . Ran Out of Food in the Last Year: Not on file  Transportation Needs:   . Lack of Transportation (Medical): Not on file  . Lack of Transportation (Non-Medical): Not on file  Physical  Activity:   . Days of Exercise per Week: Not on file  . Minutes of Exercise per Session: Not on file  Stress:   . Feeling of Stress : Not on file  Social Connections:   . Frequency of Communication with Friends and Family: Not on file  . Frequency of Social Gatherings with Friends and Family: Not on file  . Attends Religious Services: Not on file  . Active Member of Clubs or Organizations: Not on file  . Attends BankerClub or Organization Meetings: Not on file  . Marital Status: Not on file  Intimate Partner Violence:   . Fear of Current or Ex-Partner: Not on file  . Emotionally Abused: Not on file  . Physically Abused: Not on file  . Sexually Abused: Not on file     Current Outpatient Medications:  .  aspirin EC 81 MG EC tablet, Take 1 tablet (81 mg total) by mouth daily., Disp: 30 tablet, Rfl: 0 .  donepezil (ARICEPT) 10 MG tablet, Take 10 mg by mouth daily., Disp: , Rfl:  .  lisinopril (ZESTRIL) 5 MG tablet, Take 5 mg by mouth daily., Disp: , Rfl:  .  lovastatin (MEVACOR) 20 MG tablet, Take 20 mg by mouth every evening., Disp: , Rfl:  .  metFORMIN (GLUCOPHAGE) 1000  MG tablet, Take 1 tablet (1,000 mg total) by mouth 2 (two) times daily., Disp: 60 tablet, Rfl: 3 .  ondansetron (ZOFRAN ODT) 4 MG disintegrating tablet, Take 1 tablet (4 mg total) by mouth every 8 (eight) hours as needed for nausea or vomiting., Disp: 20 tablet, Rfl: 0 .  glimepiride (AMARYL) 2 MG tablet, Take 1 tablet (2 mg total) by mouth daily before breakfast., Disp: 30 tablet, Rfl: 3   No Known Allergies  ROS Review of Systems  Constitutional: Negative.   HENT: Negative.   Eyes: Negative.   Respiratory: Negative.  Negative for shortness of breath.   Cardiovascular: Negative.  Negative for chest pain.  Gastrointestinal: Negative.   Endocrine:       Diabetes.  Genitourinary: Negative.   Musculoskeletal: Negative.   Skin: Negative.   Allergic/Immunologic: Negative.   Neurological: Negative.   Hematological:  Negative.   Psychiatric/Behavioral: Negative.   All other systems reviewed and are negative.     Objective:    Physical Exam Vitals reviewed.  Constitutional:      Appearance: Normal appearance.  HENT:     Head: Normocephalic and atraumatic.     Mouth/Throat:     Mouth: Mucous membranes are moist.  Eyes:     Pupils: Pupils are equal, round, and reactive to light.  Neck:     Vascular: No carotid bruit.  Cardiovascular:     Rate and Rhythm: Normal rate and regular rhythm.     Pulses: Normal pulses.     Heart sounds: Normal heart sounds.  Pulmonary:     Effort: Pulmonary effort is normal.     Breath sounds: Normal breath sounds.  Abdominal:     General: Bowel sounds are normal.     Palpations: Abdomen is soft. There is no hepatomegaly, splenomegaly or mass.     Tenderness: There is no abdominal tenderness.     Hernia: No hernia is present.  Musculoskeletal:     Cervical back: Normal range of motion and neck supple.     Right lower leg: No edema.     Left lower leg: No edema.  Skin:    Findings: No rash.  Neurological:     Mental Status: He is alert and oriented to person, place, and time.     Motor: No weakness.  Psychiatric:        Mood and Affect: Mood normal.        Behavior: Behavior normal.     BP (!) 144/77   Pulse 64   Ht 5\' 7"  (1.702 m)   Wt 157 lb (71.2 kg)   BMI 24.59 kg/m  Wt Readings from Last 3 Encounters:  05/07/20 157 lb (71.2 kg)  04/23/20 166 lb 9.6 oz (75.6 kg)  03/23/20 176 lb 3.2 oz (79.9 kg)     Health Maintenance Due  Topic Date Due  . HEMOGLOBIN A1C  Never done  . Hepatitis C Screening  Never done  . FOOT EXAM  Never done  . OPHTHALMOLOGY EXAM  Never done  . COVID-19 Vaccine (1) Never done  . TETANUS/TDAP  Never done  . PNA vac Low Risk Adult (1 of 2 - PCV13) Never done  . INFLUENZA VACCINE  Never done    There are no preventive care reminders to display for this patient.  No results found for: TSH Lab Results  Component  Value Date   WBC 7.0 02/06/2019   HGB 13.7 02/06/2019   HCT 41.5 02/06/2019   MCV 92.4 02/06/2019  PLT 173 02/06/2019   Lab Results  Component Value Date   NA 142 02/06/2019   K 3.4 (L) 02/06/2019   CO2 25 02/06/2019   GLUCOSE 115 (H) 02/06/2019   BUN 13 02/06/2019   CREATININE 0.65 02/06/2019   BILITOT 0.6 02/05/2019   ALKPHOS 56 02/05/2019   AST 21 02/05/2019   ALT 17 02/05/2019   PROT 6.9 02/05/2019   ALBUMIN 4.1 02/05/2019   CALCIUM 8.4 (L) 02/06/2019   ANIONGAP 9 02/06/2019   Lab Results  Component Value Date   CHOL 121 02/06/2019   Lab Results  Component Value Date   HDL 51 02/06/2019   Lab Results  Component Value Date   LDLCALC 54 02/06/2019   Lab Results  Component Value Date   TRIG 81 02/06/2019   Lab Results  Component Value Date   CHOLHDL 2.4 02/06/2019   No results found for: HGBA1C    Assessment & Plan:   Problem List Items Addressed This Visit      Cardiovascular and Mediastinum   Essential hypertension    - Today, the patient's blood pressure is well managed on ace. - The patient will continue the current treatment regimen.  - I encouraged the patient to eat a low-sodium diet to help control blood pressure. - I encouraged the patient to live an active lifestyle and complete activities that increases heart rate to 85% target heart rate at least 5 times per week for one hour.            Endocrine   Diabetes 1.5, managed as type 2 (HCC)    - The patient's blood sugar is under control on metformin. - The patient will continue the current treatment regimen.  - I encouraged the patient to regularly check blood sugar.  - I encouraged the patient to monitor diet. I encouraged the patient to eat low-carb and low-sugar to help prevent blood sugar spikes.  - I encouraged the patient to continue following their prescribed treatment plan for diabetes - I informed the patient to get help if blood sugar drops below 54mg /dL, or if suddenly have  trouble thinking clearly or breathing.          Relevant Medications   glimepiride (AMARYL) 2 MG tablet     Nervous and Auditory   Late onset Alzheimer's disease without behavioral disturbance (HCC)    He has Alzheimer disease from the last several years it is gradually progressing  he does not have any behavioral disturbances.   He is not depressed.        Other   History of 2019 novel coronavirus disease (COVID-19)    No residual effect of Covid infection.  His chest is clear without any rales and rhonchi.  Heart is regular abdomen is soft nontender       Other Visit Diagnoses    Other specified diabetes mellitus with other specified complication, unspecified whether long term insulin use (HCC)    -  Primary   Relevant Medications   glimepiride (AMARYL) 2 MG tablet   Other Relevant Orders   POCT glucose (manual entry) (Completed)    Rx Glimepiride  Was stopped  Meds ordered this encounter  Medications  . glimepiride (AMARYL) 2 MG tablet    Sig: Take 1 tablet (2 mg total) by mouth daily before breakfast.    Dispense:  30 tablet    Refill:  3    Follow-up: Return in about 4 weeks (around 06/04/2020).    By signing  my name below, I, Pietro Cassis, attest that this documentation has been prepared under the direction and in the presence of [name], MD. Electronically Signed: Pietro Cassis, Medical Scribe. 05/09/20. 4:32 PM.  I personally performed the services described in this documentation, which was SCRIBED in my presence. The recorded information has been reviewed and considered accurate. It has been edited as necessary during review. [name], MD

## 2020-05-09 ENCOUNTER — Encounter: Payer: Self-pay | Admitting: Internal Medicine

## 2020-05-09 NOTE — Assessment & Plan Note (Signed)
-   The patient's blood sugar is under control on metformin. - The patient will continue the current treatment regimen.  - I encouraged the patient to regularly check blood sugar.  - I encouraged the patient to monitor diet. I encouraged the patient to eat low-carb and low-sugar to help prevent blood sugar spikes.  - I encouraged the patient to continue following their prescribed treatment plan for diabetes - I informed the patient to get help if blood sugar drops below 54mg/dL, or if suddenly have trouble thinking clearly or breathing.     

## 2020-05-09 NOTE — Assessment & Plan Note (Signed)
-   Today, the patient's blood pressure is well managed on ace. - The patient will continue the current treatment regimen.  - I encouraged the patient to eat a low-sodium diet to help control blood pressure. - I encouraged the patient to live an active lifestyle and complete activities that increases heart rate to 85% target heart rate at least 5 times per week for one hour.     

## 2020-05-09 NOTE — Assessment & Plan Note (Signed)
He has Alzheimer disease from the last several years it is gradually progressing  he does not have any behavioral disturbances.   He is not depressed.

## 2020-05-09 NOTE — Assessment & Plan Note (Signed)
No residual effect of Covid infection.  His chest is clear without any rales and rhonchi.  Heart is regular abdomen is soft nontender

## 2020-05-17 ENCOUNTER — Other Ambulatory Visit: Payer: Self-pay | Admitting: *Deleted

## 2020-05-17 MED ORDER — LOVASTATIN 20 MG PO TABS
20.0000 mg | ORAL_TABLET | Freq: Every evening | ORAL | 3 refills | Status: DC
Start: 2020-05-17 — End: 2023-12-23

## 2020-05-23 ENCOUNTER — Other Ambulatory Visit: Payer: Self-pay | Admitting: Internal Medicine

## 2020-05-23 DIAGNOSIS — R2681 Unsteadiness on feet: Secondary | ICD-10-CM

## 2020-05-23 DIAGNOSIS — R269 Unspecified abnormalities of gait and mobility: Secondary | ICD-10-CM

## 2020-05-31 ENCOUNTER — Other Ambulatory Visit: Payer: Self-pay

## 2020-05-31 ENCOUNTER — Encounter: Payer: Self-pay | Admitting: Internal Medicine

## 2020-05-31 ENCOUNTER — Ambulatory Visit (INDEPENDENT_AMBULATORY_CARE_PROVIDER_SITE_OTHER): Payer: Medicare Other | Admitting: Internal Medicine

## 2020-05-31 VITALS — BP 125/74 | HR 67 | Ht 68.0 in | Wt 168.5 lb

## 2020-05-31 DIAGNOSIS — G301 Alzheimer's disease with late onset: Secondary | ICD-10-CM

## 2020-05-31 DIAGNOSIS — I10 Essential (primary) hypertension: Secondary | ICD-10-CM

## 2020-05-31 DIAGNOSIS — F0280 Dementia in other diseases classified elsewhere without behavioral disturbance: Secondary | ICD-10-CM | POA: Diagnosis not present

## 2020-05-31 DIAGNOSIS — Z0000 Encounter for general adult medical examination without abnormal findings: Secondary | ICD-10-CM

## 2020-05-31 DIAGNOSIS — E139 Other specified diabetes mellitus without complications: Secondary | ICD-10-CM | POA: Diagnosis not present

## 2020-05-31 DIAGNOSIS — Z008 Encounter for other general examination: Secondary | ICD-10-CM

## 2020-05-31 LAB — GLUCOSE, POCT (MANUAL RESULT ENTRY): POC Glucose: 177 mg/dl — AB (ref 70–99)

## 2020-05-31 NOTE — Assessment & Plan Note (Signed)
-   Today, the patient's blood pressure is well managed on lisinopril. - The patient will continue the current treatment regimen.  - I encouraged the patient to eat a low-sodium diet to help control blood pressure. - I encouraged the patient to live an active lifestyle and complete activities that increases heart rate to 85% target heart rate at least 5 times per week for one hour.     

## 2020-05-31 NOTE — Assessment & Plan Note (Signed)
Patient Alzheimer disease is stable.  He has loss of memory there is no behavioral disturbances he is not depressed.  He does not have any movement disorder.

## 2020-05-31 NOTE — Assessment & Plan Note (Signed)
-   The patient's blood sugar is under control on glimipride. - The patient will continue the current treatment regimen.  - I encouraged the patient to regularly check blood sugar.  - I encouraged the patient to monitor diet. I encouraged the patient to eat low-carb and low-sugar to help prevent blood sugar spikes.  - I encouraged the patient to continue following their prescribed treatment plan for diabetes - I informed the patient to get help if blood sugar drops below 54mg /dL, or if suddenly have trouble thinking clearly or breathing.

## 2020-05-31 NOTE — Assessment & Plan Note (Signed)
Rectal examination was negative stool negative for blood.  Prostate 2+ enlarged.

## 2020-05-31 NOTE — Progress Notes (Signed)
Established Patient Office Visit  Subjective:  Patient ID: Luke Chen, male    DOB: 23-Dec-1940  Age: 79 y.o. MRN: [id number]  CC:  Chief Complaint  Patient presents with  . Annual Exam    HPI  Siaosi Alter presents for annual checkup.  He denies any chest pain shortness of breath.  He has trouble walking due to weakness of the proximal leg muscles.  His memory is decreased but he can name 1 or 2 president.Marland Kitchen  He did not draw his clock right  Past Medical History:  Diagnosis Date  . Dementia (HCC)   . Diabetes mellitus without complication (HCC)   . Hypertension     History reviewed. No pertinent surgical history.  History reviewed. No pertinent family history.  Social History   Socioeconomic History  . Marital status: Unknown    Spouse name: Not on file  . Number of children: Not on file  . Years of education: Not on file  . Highest education level: Not on file  Occupational History  . Not on file  Tobacco Use  . Smoking status: Never Smoker  . Smokeless tobacco: Never Used  Substance and Sexual Activity  . Alcohol use: Never  . Drug use: Never  . Sexual activity: Not Currently  Other Topics Concern  . Not on file  Social History Narrative  . Not on file   Social Determinants of Health   Financial Resource Strain:   . Difficulty of Paying Living Expenses: Not on file  Food Insecurity:   . Worried About Programme researcher, broadcasting/film/video in the Last Year: Not on file  . Ran Out of Food in the Last Year: Not on file  Transportation Needs:   . Lack of Transportation (Medical): Not on file  . Lack of Transportation (Non-Medical): Not on file  Physical Activity:   . Days of Exercise per Week: Not on file  . Minutes of Exercise per Session: Not on file  Stress:   . Feeling of Stress : Not on file  Social Connections:   . Frequency of Communication with Friends and Family: Not on file  . Frequency of Social Gatherings with Friends and Family: Not on file  . Attends  Religious Services: Not on file  . Active Member of Clubs or Organizations: Not on file  . Attends Banker Meetings: Not on file  . Marital Status: Not on file  Intimate Partner Violence:   . Fear of Current or Ex-Partner: Not on file  . Emotionally Abused: Not on file  . Physically Abused: Not on file  . Sexually Abused: Not on file     Current Outpatient Medications:  .  aspirin EC 81 MG EC tablet, Take 1 tablet (81 mg total) by mouth daily., Disp: 30 tablet, Rfl: 0 .  donepezil (ARICEPT) 10 MG tablet, TAKE 1 TABLET BY MOUTH EVERY DAY, Disp: 30 tablet, Rfl: 6 .  glimepiride (AMARYL) 2 MG tablet, Take 1 tablet (2 mg total) by mouth daily before breakfast., Disp: 30 tablet, Rfl: 3 .  lisinopril (ZESTRIL) 5 MG tablet, Take 5 mg by mouth daily., Disp: , Rfl:  .  lovastatin (MEVACOR) 20 MG tablet, Take 1 tablet (20 mg total) by mouth every evening., Disp: 90 tablet, Rfl: 3 .  metFORMIN (GLUCOPHAGE) 1000 MG tablet, Take 1 tablet (1,000 mg total) by mouth 2 (two) times daily., Disp: 60 tablet, Rfl: 3 .  ondansetron (ZOFRAN ODT) 4 MG disintegrating tablet, Take 1 tablet (4 mg  total) by mouth every 8 (eight) hours as needed for nausea or vomiting., Disp: 20 tablet, Rfl: 0   No Known Allergies  ROS Review of Systems  Constitutional: Positive for appetite change and fever.  HENT: Negative.  Negative for congestion and facial swelling.   Eyes: Negative.  Negative for pain.  Respiratory: Negative.  Negative for chest tightness.   Cardiovascular: Negative.  Negative for chest pain.  Gastrointestinal: Negative.  Negative for abdominal distention, abdominal pain, constipation, diarrhea, nausea and rectal pain.  Endocrine: Negative.  Negative for heat intolerance.  Genitourinary: Positive for enuresis. Negative for dysuria.  Musculoskeletal: Negative.  Negative for arthralgias.  Skin: Negative.   Allergic/Immunologic: Negative.   Neurological: Positive for speech difficulty and  weakness. Negative for dizziness, seizures, numbness and headaches.  Hematological: Negative.   Psychiatric/Behavioral: Positive for confusion and decreased concentration. The patient is not nervous/anxious.   All other systems reviewed and are negative.     Objective:    Physical Exam Vitals reviewed.  Constitutional:      Appearance: Normal appearance.  HENT:     Mouth/Throat:     Mouth: Mucous membranes are moist.  Eyes:     Pupils: Pupils are equal, round, and reactive to light.  Neck:     Vascular: No carotid bruit.  Cardiovascular:     Rate and Rhythm: Normal rate and regular rhythm.     Pulses: Normal pulses.     Heart sounds: Normal heart sounds.  Pulmonary:     Effort: Pulmonary effort is normal.     Breath sounds: Normal breath sounds.  Abdominal:     General: Bowel sounds are normal.     Palpations: Abdomen is soft. There is no hepatomegaly, splenomegaly or mass.     Tenderness: There is no abdominal tenderness.     Hernia: No hernia is present.  Genitourinary:    Comments: Rectal examination was done.  Prostate is 2+.  Stool negative for blood. Musculoskeletal:     Cervical back: Neck supple.     Right lower leg: No edema.     Left lower leg: No edema.  Skin:    Findings: No rash.  Neurological:     Mental Status: He is alert and oriented to person, place, and time.     Motor: No weakness.  Psychiatric:        Mood and Affect: Mood normal.        Behavior: Behavior normal.     BP 125/74   Pulse 67   Ht 5\' 8"  (1.727 m)   Wt 168 lb 8 oz (76.4 kg)   BMI 25.62 kg/m  Wt Readings from Last 3 Encounters:  05/31/20 168 lb 8 oz (76.4 kg)  05/07/20 157 lb (71.2 kg)  04/23/20 166 lb 9.6 oz (75.6 kg)     Health Maintenance Due  Topic Date Due  . HEMOGLOBIN A1C  Never done  . Hepatitis C Screening  Never done  . FOOT EXAM  Never done  . OPHTHALMOLOGY EXAM  Never done  . COVID-19 Vaccine (1) Never done  . TETANUS/TDAP  Never done  . PNA vac Low Risk  Adult (1 of 2 - PCV13) Never done  . INFLUENZA VACCINE  Never done    There are no preventive care reminders to display for this patient.  No results found for: TSH Lab Results  Component Value Date   WBC 7.0 02/06/2019   HGB 13.7 02/06/2019   HCT 41.5 02/06/2019   MCV  92.4 02/06/2019   PLT 173 02/06/2019   Lab Results  Component Value Date   NA 142 02/06/2019   K 3.4 (L) 02/06/2019   CO2 25 02/06/2019   GLUCOSE 115 (H) 02/06/2019   BUN 13 02/06/2019   CREATININE 0.65 02/06/2019   BILITOT 0.6 02/05/2019   ALKPHOS 56 02/05/2019   AST 21 02/05/2019   ALT 17 02/05/2019   PROT 6.9 02/05/2019   ALBUMIN 4.1 02/05/2019   CALCIUM 8.4 (L) 02/06/2019   ANIONGAP 9 02/06/2019   Lab Results  Component Value Date   CHOL 121 02/06/2019   Lab Results  Component Value Date   HDL 51 02/06/2019   Lab Results  Component Value Date   LDLCALC 54 02/06/2019   Lab Results  Component Value Date   TRIG 81 02/06/2019   Lab Results  Component Value Date   CHOLHDL 2.4 02/06/2019   No results found for: HGBA1C    Assessment & Plan:   Problem List Items Addressed This Visit      Cardiovascular and Mediastinum   Essential hypertension    - Today, the patient's blood pressure is well managed on lisinopril. - The patient will continue the current treatment regimen.  - I encouraged the patient to eat a low-sodium diet to help control blood pressure. - I encouraged the patient to live an active lifestyle and complete activities that increases heart rate to 85% target heart rate at least 5 times per week for one hour.            Endocrine   Diabetes 1.5, managed as type 2 (HCC) - Primary    - The patient's blood sugar is under control on glimipride. - The patient will continue the current treatment regimen.  - I encouraged the patient to regularly check blood sugar.  - I encouraged the patient to monitor diet. I encouraged the patient to eat low-carb and low-sugar to help  prevent blood sugar spikes.  - I encouraged the patient to continue following their prescribed treatment plan for diabetes - I informed the patient to get help if blood sugar drops below 54mg /dL, or if suddenly have trouble thinking clearly or breathing.          Relevant Orders   POCT glucose (manual entry) (Completed)     Nervous and Auditory   Late onset Alzheimer's disease without behavioral disturbance [facility])    Patient Alzheimer disease is stable.  He has loss of memory there is no behavioral disturbances he is not depressed.  He does not have any movement disorder.        Other   Rectal exam    Rectal examination was negative stool negative for blood.  Prostate 2+ enlarged.         No orders of the defined types were placed in this encounter.   Follow-up: No follow-ups on file.    [facility], MD

## 2020-06-07 ENCOUNTER — Ambulatory Visit: Payer: Medicare Other | Admitting: Internal Medicine

## 2020-06-25 ENCOUNTER — Other Ambulatory Visit: Payer: Self-pay | Admitting: Internal Medicine

## 2020-06-25 DIAGNOSIS — E139 Other specified diabetes mellitus without complications: Secondary | ICD-10-CM

## 2020-07-22 ENCOUNTER — Other Ambulatory Visit: Payer: Self-pay | Admitting: Internal Medicine

## 2020-07-28 ENCOUNTER — Ambulatory Visit: Payer: Medicare Other | Admitting: Internal Medicine

## 2020-08-16 ENCOUNTER — Other Ambulatory Visit: Payer: Self-pay

## 2020-08-16 ENCOUNTER — Ambulatory Visit: Payer: Medicare Other

## 2020-08-16 ENCOUNTER — Other Ambulatory Visit (INDEPENDENT_AMBULATORY_CARE_PROVIDER_SITE_OTHER): Payer: Medicare Other

## 2020-08-16 DIAGNOSIS — Z20822 Contact with and (suspected) exposure to covid-19: Secondary | ICD-10-CM | POA: Diagnosis not present

## 2020-08-16 DIAGNOSIS — U071 COVID-19: Secondary | ICD-10-CM

## 2020-08-16 LAB — POC COVID19 BINAXNOW: SARS Coronavirus 2 Ag: NEGATIVE

## 2020-08-16 MED ORDER — GUAIFENESIN 100 MG/5ML PO SOLN: 5.0000 mL | Freq: Four times a day (QID) | ORAL | 0 refills | Status: DC | PRN

## 2020-08-16 MED ORDER — AZITHROMYCIN 250 MG PO TABS: ORAL_TABLET | ORAL | 0 refills | Status: DC

## 2020-09-10 LAB — POC COVID19 BINAXNOW: SARS Coronavirus 2 Ag: NEGATIVE

## 2020-09-10 NOTE — Addendum Note (Signed)
Addended by: Jobie Quaker on: 09/10/2020 03:08 PM   Modules accepted: Orders

## 2020-09-22 ENCOUNTER — Other Ambulatory Visit: Payer: Self-pay | Admitting: Internal Medicine

## 2020-09-22 DIAGNOSIS — E139 Other specified diabetes mellitus without complications: Secondary | ICD-10-CM

## 2020-10-15 ENCOUNTER — Other Ambulatory Visit: Payer: Self-pay | Admitting: *Deleted

## 2020-10-15 MED ORDER — LISINOPRIL 5 MG PO TABS: 5.0000 mg | ORAL_TABLET | Freq: Every day | ORAL | 3 refills | Status: AC

## 2020-10-23 ENCOUNTER — Other Ambulatory Visit: Payer: Self-pay | Admitting: Internal Medicine

## 2020-11-10 DIAGNOSIS — R4781 Slurred speech: Secondary | ICD-10-CM | POA: Diagnosis not present

## 2020-11-10 DIAGNOSIS — I6322 Cerebral infarction due to unspecified occlusion or stenosis of basilar arteries: Secondary | ICD-10-CM | POA: Diagnosis not present

## 2020-11-10 DIAGNOSIS — I517 Cardiomegaly: Secondary | ICD-10-CM | POA: Diagnosis not present

## 2020-11-10 DIAGNOSIS — N179 Acute kidney failure, unspecified: Secondary | ICD-10-CM | POA: Diagnosis not present

## 2020-11-10 DIAGNOSIS — I110 Hypertensive heart disease with heart failure: Secondary | ICD-10-CM | POA: Diagnosis not present

## 2020-11-10 DIAGNOSIS — I5022 Chronic systolic (congestive) heart failure: Secondary | ICD-10-CM | POA: Diagnosis not present

## 2020-11-10 DIAGNOSIS — R4701 Aphasia: Secondary | ICD-10-CM | POA: Diagnosis not present

## 2020-11-10 DIAGNOSIS — I2510 Atherosclerotic heart disease of native coronary artery without angina pectoris: Secondary | ICD-10-CM | POA: Diagnosis not present

## 2020-11-10 DIAGNOSIS — F05 Delirium due to known physiological condition: Secondary | ICD-10-CM | POA: Diagnosis not present

## 2020-11-10 DIAGNOSIS — I63433 Cerebral infarction due to embolism of bilateral posterior cerebral arteries: Secondary | ICD-10-CM | POA: Diagnosis not present

## 2020-11-11 DIAGNOSIS — I63433 Cerebral infarction due to embolism of bilateral posterior cerebral arteries: Secondary | ICD-10-CM | POA: Diagnosis present

## 2020-11-11 DIAGNOSIS — Z7982 Long term (current) use of aspirin: Secondary | ICD-10-CM | POA: Diagnosis not present

## 2020-11-11 DIAGNOSIS — I513 Intracardiac thrombosis, not elsewhere classified: Secondary | ICD-10-CM | POA: Diagnosis present

## 2020-11-11 DIAGNOSIS — I69319 Unspecified symptoms and signs involving cognitive functions following cerebral infarction: Secondary | ICD-10-CM | POA: Diagnosis not present

## 2020-11-11 DIAGNOSIS — H543 Unqualified visual loss, both eyes: Secondary | ICD-10-CM | POA: Diagnosis present

## 2020-11-11 DIAGNOSIS — I5020 Unspecified systolic (congestive) heart failure: Secondary | ICD-10-CM | POA: Diagnosis not present

## 2020-11-11 DIAGNOSIS — N179 Acute kidney failure, unspecified: Secondary | ICD-10-CM | POA: Diagnosis present

## 2020-11-11 DIAGNOSIS — I6322 Cerebral infarction due to unspecified occlusion or stenosis of basilar arteries: Secondary | ICD-10-CM | POA: Diagnosis not present

## 2020-11-11 DIAGNOSIS — E785 Hyperlipidemia, unspecified: Secondary | ICD-10-CM | POA: Diagnosis present

## 2020-11-11 DIAGNOSIS — E119 Type 2 diabetes mellitus without complications: Secondary | ICD-10-CM | POA: Diagnosis not present

## 2020-11-11 DIAGNOSIS — E11649 Type 2 diabetes mellitus with hypoglycemia without coma: Secondary | ICD-10-CM | POA: Diagnosis not present

## 2020-11-11 DIAGNOSIS — R29701 NIHSS score 1: Secondary | ICD-10-CM | POA: Diagnosis present

## 2020-11-11 DIAGNOSIS — E54 Ascorbic acid deficiency: Secondary | ICD-10-CM | POA: Diagnosis present

## 2020-11-11 DIAGNOSIS — E118 Type 2 diabetes mellitus with unspecified complications: Secondary | ICD-10-CM | POA: Diagnosis not present

## 2020-11-11 DIAGNOSIS — R4701 Aphasia: Secondary | ICD-10-CM | POA: Diagnosis present

## 2020-11-11 DIAGNOSIS — I2510 Atherosclerotic heart disease of native coronary artery without angina pectoris: Secondary | ICD-10-CM | POA: Diagnosis present

## 2020-11-11 DIAGNOSIS — I6389 Other cerebral infarction: Secondary | ICD-10-CM | POA: Diagnosis not present

## 2020-11-11 DIAGNOSIS — Z8616 Personal history of COVID-19: Secondary | ICD-10-CM | POA: Diagnosis not present

## 2020-11-11 DIAGNOSIS — Z7984 Long term (current) use of oral hypoglycemic drugs: Secondary | ICD-10-CM | POA: Diagnosis not present

## 2020-11-11 DIAGNOSIS — Z20822 Contact with and (suspected) exposure to covid-19: Secondary | ICD-10-CM | POA: Diagnosis present

## 2020-11-11 DIAGNOSIS — F0280 Dementia in other diseases classified elsewhere without behavioral disturbance: Secondary | ICD-10-CM | POA: Diagnosis present

## 2020-11-11 DIAGNOSIS — I70208 Unspecified atherosclerosis of native arteries of extremities, other extremity: Secondary | ICD-10-CM | POA: Diagnosis present

## 2020-11-11 DIAGNOSIS — I6522 Occlusion and stenosis of left carotid artery: Secondary | ICD-10-CM | POA: Diagnosis present

## 2020-11-11 DIAGNOSIS — F05 Delirium due to known physiological condition: Secondary | ICD-10-CM | POA: Diagnosis present

## 2020-11-11 DIAGNOSIS — E1165 Type 2 diabetes mellitus with hyperglycemia: Secondary | ICD-10-CM | POA: Diagnosis not present

## 2020-11-11 DIAGNOSIS — G301 Alzheimer's disease with late onset: Secondary | ICD-10-CM | POA: Diagnosis present

## 2020-11-11 DIAGNOSIS — D72829 Elevated white blood cell count, unspecified: Secondary | ICD-10-CM | POA: Diagnosis present

## 2020-11-11 DIAGNOSIS — I6502 Occlusion and stenosis of left vertebral artery: Secondary | ICD-10-CM | POA: Diagnosis present

## 2020-11-11 DIAGNOSIS — I5022 Chronic systolic (congestive) heart failure: Secondary | ICD-10-CM | POA: Diagnosis present

## 2020-11-11 DIAGNOSIS — I110 Hypertensive heart disease with heart failure: Secondary | ICD-10-CM | POA: Diagnosis present

## 2020-11-11 DIAGNOSIS — I517 Cardiomegaly: Secondary | ICD-10-CM | POA: Diagnosis not present

## 2020-11-11 DIAGNOSIS — Z781 Physical restraint status: Secondary | ICD-10-CM | POA: Diagnosis not present

## 2020-11-11 DIAGNOSIS — E538 Deficiency of other specified B group vitamins: Secondary | ICD-10-CM | POA: Diagnosis present

## 2020-11-22 ENCOUNTER — Other Ambulatory Visit: Payer: Self-pay

## 2020-11-22 ENCOUNTER — Encounter: Payer: Self-pay | Admitting: Internal Medicine

## 2020-11-22 ENCOUNTER — Ambulatory Visit (INDEPENDENT_AMBULATORY_CARE_PROVIDER_SITE_OTHER): Payer: Medicare Other | Admitting: Family Medicine

## 2020-11-22 VITALS — BP 128/75 | HR 88 | Ht 68.0 in | Wt 164.9 lb

## 2020-11-22 DIAGNOSIS — Z09 Encounter for follow-up examination after completed treatment for conditions other than malignant neoplasm: Secondary | ICD-10-CM

## 2020-11-22 DIAGNOSIS — E139 Other specified diabetes mellitus without complications: Secondary | ICD-10-CM | POA: Diagnosis not present

## 2020-11-22 NOTE — Assessment & Plan Note (Signed)
Patient in today post hospital d/c for CVA at [facility]. [facility] changed Glipizide from 5 mg to 1 mg daily. Since then his Glucose has been above 250 most times it is checked. 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 Since leaving the hospital wife has noticed he is doing well with ADL.    Plan- Discussed long term goals of glucose control in the face of his CVA hx and significant degenerative brain disease. I offered a once a week injectable for DM but they want to discuss more options when they fu with Neurology.  So fu with [facility] then if glucose still elevated come back to [place] and we will figure out the best course of action.

## 2020-11-22 NOTE — Progress Notes (Signed)
Established Patient Office Visit  SUBJECTIVE:  Subjective  Patient ID: Luke Chen, male    DOB: 15-Sep-1940  Age: 80 y.o. MRN: [id number]  CC:  Chief Complaint  Patient presents with  . Follow-up    Follow up for ER visit    HPI Luke Chen is a 81 y.o. male presenting today for     Past Medical History:  Diagnosis Date  . Dementia (HCC)   . Diabetes mellitus without complication (HCC)   . Hypertension     History reviewed. No pertinent surgical history.  History reviewed. No pertinent family history.  Social History   Socioeconomic History  . Marital status: Unknown    Spouse name: Not on file  . Number of children: Not on file  . Years of education: Not on file  . Highest education level: Not on file  Occupational History  . Not on file  Tobacco Use  . Smoking status: Never Smoker  . Smokeless tobacco: Never Used  Substance and Sexual Activity  . Alcohol use: Never  . Drug use: Never  . Sexual activity: Not Currently  Other Topics Concern  . Not on file  Social History Narrative  . Not on file   Social Determinants of Health   Financial Resource Strain: Not on file  Food Insecurity: Not on file  Transportation Needs: Not on file  Physical Activity: Not on file  Stress: Not on file  Social Connections: Not on file  Intimate Partner Violence: Not on file     Current Outpatient Medications:  .  aspirin EC 81 MG EC tablet, Take 1 tablet (81 mg total) by mouth daily., Disp: 30 tablet, Rfl: 0 .  azithromycin (ZITHROMAX) 250 MG tablet, Use as directed, Disp: 6 tablet, Rfl: 0 .  donepezil (ARICEPT) 10 MG tablet, TAKE 1 TABLET BY MOUTH EVERY DAY, Disp: 30 tablet, Rfl: 6 .  glimepiride (AMARYL) 1 MG tablet, TAKE 1 TABLET (1 MG TOTAL) BY MOUTH DAILY WITH BREAKFAST., Disp: 90 tablet, Rfl: 2 .  glimepiride (AMARYL) 2 MG tablet, TAKE 1 TABLET BY MOUTH DAILY BEFORE BREAKFAST., Disp: 90 tablet, Rfl: 1 .  guaiFENesin (ROBITUSSIN) 100 MG/5ML SOLN, Take 5  mLs (100 mg total) by mouth every 6 (six) hours as needed for cough or to loosen phlegm., Disp: 236 mL, Rfl: 0 .  lisinopril (ZESTRIL) 5 MG tablet, Take 1 tablet (5 mg total) by mouth daily., Disp: 90 tablet, Rfl: 3 .  lovastatin (MEVACOR) 20 MG tablet, Take 1 tablet (20 mg total) by mouth every evening., Disp: 90 tablet, Rfl: 3 .  metFORMIN (GLUCOPHAGE) 1000 MG tablet, TAKE 1 TABLET BY MOUTH TWICE A DAY, Disp: 180 tablet, Rfl: 1 .  ondansetron (ZOFRAN ODT) 4 MG disintegrating tablet, Take 1 tablet (4 mg total) by mouth every 8 (eight) hours as needed for nausea or vomiting., Disp: 20 tablet, Rfl: 0   No Known Allergies  ROS Review of Systems  Constitutional: Negative.   HENT: Negative.   Respiratory: Negative.   Cardiovascular: Negative.   Genitourinary: Negative.   Musculoskeletal: Negative.   Skin: Negative.   Psychiatric/Behavioral: Positive for confusion.     OBJECTIVE:    Physical Exam Vitals and nursing note reviewed.  HENT:     Right Ear: Tympanic membrane normal.     Left Ear: Tympanic membrane normal.     Mouth/Throat:     Mouth: Mucous membranes are moist.  Cardiovascular:     Rate and Rhythm: Normal rate and regular rhythm.  Musculoskeletal:        General: Normal range of motion.     BP 128/75   Pulse 88   Ht 5\' 8"  (1.727 m)   Wt 164 lb 14.4 oz (74.8 kg)   BMI 25.07 kg/m  Wt Readings from Last 3 Encounters:  11/22/20 164 lb 14.4 oz (74.8 kg)  05/31/20 168 lb 8 oz (76.4 kg)  05/07/20 157 lb (71.2 kg)    Health Maintenance Due  Topic Date Due  . HEMOGLOBIN A1C  Never done  . Hepatitis C Screening  Never done  . COVID-19 Vaccine (1) Never done  . FOOT EXAM  Never done  . TETANUS/TDAP  Never done  . PNA vac Low Risk Adult (1 of 2 - PCV13) Never done    There are no preventive care reminders to display for this patient.  CBC Latest Ref Rng & Units 02/06/2019 02/05/2019 05/16/2017  WBC 4.0 - 10.5 K/uL 7.0 9.0 15.0(H)  Hemoglobin 13.0 - 17.0 g/dL 07/16/2017  56.2 13.0  Hematocrit 39.0 - 52.0 % 41.5 46.0 46.8  Platelets 150 - 400 K/uL 173 207 185   CMP Latest Ref Rng & Units 02/06/2019 02/05/2019 05/16/2017  Glucose 70 - 99 mg/dL 07/16/2017) 784(O) 962(X)  BUN 8 - 23 mg/dL 13 15 17   Creatinine 0.61 - 1.24 mg/dL 528(U 1.32)  Sodium 135 - 145 mmol/L 142 140 137  Potassium 3.5 - 5.1 mmol/L 3.4(L) 3.2(L) 4.0  Chloride 98 - 111 mmol/L 108 105 99(L)  CO2 22 - 32 mmol/L 25 23 27   Calcium 8.9 - 10.3 mg/dL 4.40) 9.0 9.1  Total Protein 6.5 - 8.1 g/dL - 6.9 6.9  Total Bilirubin 0.3 - 1.2 mg/dL - 0.6 1.0  Alkaline Phos 38 - 126 U/L - 56 59  AST 15 - 41 U/L - 21 23  ALT 0 - 44 U/L - 17 16(L)    No results found for: TSH Lab Results  Component Value Date   ALBUMIN 4.1 02/05/2019   ANIONGAP 9 02/06/2019   Lab Results  Component Value Date   CHOL 121 02/06/2019   HDL 51 02/06/2019   LDLCALC 54 02/06/2019   CHOLHDL 2.4 02/06/2019   Lab Results  Component Value Date   TRIG 81 02/06/2019   No results found for: HGBA1C    ASSESSMENT & PLAN:   Problem List Items Addressed This Visit      Endocrine   Diabetes 1.5, 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 changed Glipizide from 5 mg to 1 mg daily. Since then his Glucose has been above 250 most times it is checked. 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 Since leaving the hospital wife has noticed he is doing well with ADL.    Plan- Discussed long term goals of glucose control in the face of his CVA hx and significant degenerative brain disease. I offered a once a week injectable for DM but they want to discuss more options when they fu with Neurology.  So fu with [facility] then if glucose still elevated come back to 02/08/2019 and we will figure out the best course of action.           No orders of the defined types  were placed in this  encounter.     Follow-up: No follow-ups on file.    [name], MD [facility] [address] [place], [place] [address]

## 2020-12-03 ENCOUNTER — Other Ambulatory Visit: Payer: Self-pay | Admitting: Internal Medicine

## 2020-12-03 DIAGNOSIS — R2681 Unsteadiness on feet: Secondary | ICD-10-CM

## 2020-12-03 DIAGNOSIS — R269 Unspecified abnormalities of gait and mobility: Secondary | ICD-10-CM

## 2020-12-07 ENCOUNTER — Ambulatory Visit: Payer: Medicare Other | Admitting: Internal Medicine

## 2020-12-15 DIAGNOSIS — I63532 Cerebral infarction due to unspecified occlusion or stenosis of left posterior cerebral artery: Secondary | ICD-10-CM | POA: Diagnosis not present

## 2020-12-15 DIAGNOSIS — I447 Left bundle-branch block, unspecified: Secondary | ICD-10-CM | POA: Diagnosis not present

## 2020-12-15 DIAGNOSIS — I5020 Unspecified systolic (congestive) heart failure: Secondary | ICD-10-CM | POA: Diagnosis not present

## 2020-12-15 DIAGNOSIS — U071 COVID-19: Secondary | ICD-10-CM | POA: Diagnosis not present

## 2020-12-15 DIAGNOSIS — E1100 Type 2 diabetes mellitus with hyperosmolarity without nonketotic hyperglycemic-hyperosmolar coma (NKHHC): Secondary | ICD-10-CM | POA: Diagnosis not present

## 2020-12-15 DIAGNOSIS — I2129 ST elevation (STEMI) myocardial infarction involving other sites: Secondary | ICD-10-CM | POA: Diagnosis not present

## 2020-12-25 DIAGNOSIS — M4685 Other specified inflammatory spondylopathies, thoracolumbar region: Secondary | ICD-10-CM | POA: Diagnosis not present

## 2020-12-25 DIAGNOSIS — M1711 Unilateral primary osteoarthritis, right knee: Secondary | ICD-10-CM | POA: Diagnosis not present

## 2020-12-25 DIAGNOSIS — M1712 Unilateral primary osteoarthritis, left knee: Secondary | ICD-10-CM | POA: Diagnosis not present

## 2021-02-02 DIAGNOSIS — I5020 Unspecified systolic (congestive) heart failure: Secondary | ICD-10-CM | POA: Diagnosis not present

## 2021-02-18 DIAGNOSIS — Z7984 Long term (current) use of oral hypoglycemic drugs: Secondary | ICD-10-CM | POA: Diagnosis not present

## 2021-02-18 DIAGNOSIS — I5022 Chronic systolic (congestive) heart failure: Secondary | ICD-10-CM | POA: Diagnosis not present

## 2021-02-18 DIAGNOSIS — E119 Type 2 diabetes mellitus without complications: Secondary | ICD-10-CM | POA: Diagnosis not present

## 2021-02-18 DIAGNOSIS — F0390 Unspecified dementia without behavioral disturbance: Secondary | ICD-10-CM | POA: Diagnosis not present

## 2021-02-18 DIAGNOSIS — I2582 Chronic total occlusion of coronary artery: Secondary | ICD-10-CM | POA: Diagnosis not present

## 2021-02-18 DIAGNOSIS — Z7902 Long term (current) use of antithrombotics/antiplatelets: Secondary | ICD-10-CM | POA: Diagnosis not present

## 2021-02-18 DIAGNOSIS — I513 Intracardiac thrombosis, not elsewhere classified: Secondary | ICD-10-CM | POA: Diagnosis not present

## 2021-02-18 DIAGNOSIS — Z8673 Personal history of transient ischemic attack (TIA), and cerebral infarction without residual deficits: Secondary | ICD-10-CM | POA: Diagnosis not present

## 2021-02-18 DIAGNOSIS — Z955 Presence of coronary angioplasty implant and graft: Secondary | ICD-10-CM | POA: Diagnosis not present

## 2021-02-18 DIAGNOSIS — I2510 Atherosclerotic heart disease of native coronary artery without angina pectoris: Secondary | ICD-10-CM | POA: Diagnosis not present

## 2021-02-18 DIAGNOSIS — I639 Cerebral infarction, unspecified: Secondary | ICD-10-CM | POA: Diagnosis not present

## 2021-02-18 DIAGNOSIS — I110 Hypertensive heart disease with heart failure: Secondary | ICD-10-CM | POA: Diagnosis not present

## 2021-02-18 DIAGNOSIS — R9439 Abnormal result of other cardiovascular function study: Secondary | ICD-10-CM | POA: Diagnosis not present

## 2021-02-19 DIAGNOSIS — I2510 Atherosclerotic heart disease of native coronary artery without angina pectoris: Secondary | ICD-10-CM | POA: Diagnosis not present

## 2021-02-19 DIAGNOSIS — I5022 Chronic systolic (congestive) heart failure: Secondary | ICD-10-CM | POA: Diagnosis not present

## 2021-02-19 DIAGNOSIS — E119 Type 2 diabetes mellitus without complications: Secondary | ICD-10-CM | POA: Diagnosis not present

## 2021-02-19 DIAGNOSIS — R9439 Abnormal result of other cardiovascular function study: Secondary | ICD-10-CM | POA: Diagnosis not present

## 2021-02-19 DIAGNOSIS — I110 Hypertensive heart disease with heart failure: Secondary | ICD-10-CM | POA: Diagnosis not present

## 2021-02-19 DIAGNOSIS — I2582 Chronic total occlusion of coronary artery: Secondary | ICD-10-CM | POA: Diagnosis not present

## 2021-02-21 DIAGNOSIS — Z20822 Contact with and (suspected) exposure to covid-19: Secondary | ICD-10-CM | POA: Diagnosis not present

## 2021-03-16 ENCOUNTER — Other Ambulatory Visit: Payer: Self-pay | Admitting: Internal Medicine

## 2021-03-16 DIAGNOSIS — E139 Other specified diabetes mellitus without complications: Secondary | ICD-10-CM

## 2021-03-24 DIAGNOSIS — I509 Heart failure, unspecified: Secondary | ICD-10-CM | POA: Diagnosis not present

## 2021-03-24 DIAGNOSIS — I25110 Atherosclerotic heart disease of native coronary artery with unstable angina pectoris: Secondary | ICD-10-CM | POA: Diagnosis not present

## 2021-03-24 DIAGNOSIS — G301 Alzheimer's disease with late onset: Secondary | ICD-10-CM | POA: Diagnosis not present

## 2021-03-24 DIAGNOSIS — E119 Type 2 diabetes mellitus without complications: Secondary | ICD-10-CM | POA: Diagnosis not present

## 2021-03-24 DIAGNOSIS — I110 Hypertensive heart disease with heart failure: Secondary | ICD-10-CM | POA: Diagnosis not present

## 2021-03-24 DIAGNOSIS — I2510 Atherosclerotic heart disease of native coronary artery without angina pectoris: Secondary | ICD-10-CM | POA: Diagnosis not present

## 2021-03-24 DIAGNOSIS — Z7984 Long term (current) use of oral hypoglycemic drugs: Secondary | ICD-10-CM | POA: Diagnosis not present

## 2021-03-24 DIAGNOSIS — Z955 Presence of coronary angioplasty implant and graft: Secondary | ICD-10-CM | POA: Diagnosis not present

## 2021-03-24 DIAGNOSIS — E785 Hyperlipidemia, unspecified: Secondary | ICD-10-CM | POA: Diagnosis not present

## 2021-03-24 DIAGNOSIS — I5020 Unspecified systolic (congestive) heart failure: Secondary | ICD-10-CM | POA: Diagnosis not present

## 2021-03-24 DIAGNOSIS — Z79899 Other long term (current) drug therapy: Secondary | ICD-10-CM | POA: Diagnosis not present

## 2021-03-24 DIAGNOSIS — I2582 Chronic total occlusion of coronary artery: Secondary | ICD-10-CM | POA: Diagnosis not present

## 2021-03-24 DIAGNOSIS — F0280 Dementia in other diseases classified elsewhere without behavioral disturbance: Secondary | ICD-10-CM | POA: Diagnosis not present

## 2021-03-25 ENCOUNTER — Other Ambulatory Visit: Payer: Self-pay | Admitting: Internal Medicine

## 2021-03-25 DIAGNOSIS — E119 Type 2 diabetes mellitus without complications: Secondary | ICD-10-CM | POA: Diagnosis not present

## 2021-03-25 DIAGNOSIS — I25110 Atherosclerotic heart disease of native coronary artery with unstable angina pectoris: Secondary | ICD-10-CM | POA: Diagnosis not present

## 2021-03-25 DIAGNOSIS — I5020 Unspecified systolic (congestive) heart failure: Secondary | ICD-10-CM | POA: Diagnosis not present

## 2021-03-25 DIAGNOSIS — Z955 Presence of coronary angioplasty implant and graft: Secondary | ICD-10-CM | POA: Diagnosis not present

## 2021-03-25 DIAGNOSIS — G301 Alzheimer's disease with late onset: Secondary | ICD-10-CM | POA: Diagnosis not present

## 2021-03-25 DIAGNOSIS — I110 Hypertensive heart disease with heart failure: Secondary | ICD-10-CM | POA: Diagnosis not present

## 2021-04-13 DIAGNOSIS — I5020 Unspecified systolic (congestive) heart failure: Secondary | ICD-10-CM | POA: Diagnosis not present

## 2021-04-13 DIAGNOSIS — F0280 Dementia in other diseases classified elsewhere without behavioral disturbance: Secondary | ICD-10-CM | POA: Diagnosis not present

## 2021-04-13 DIAGNOSIS — Z95820 Peripheral vascular angioplasty status with implants and grafts: Secondary | ICD-10-CM | POA: Diagnosis not present

## 2021-04-13 DIAGNOSIS — I259 Chronic ischemic heart disease, unspecified: Secondary | ICD-10-CM | POA: Diagnosis not present

## 2021-04-13 DIAGNOSIS — G301 Alzheimer's disease with late onset: Secondary | ICD-10-CM | POA: Diagnosis not present

## 2021-04-13 DIAGNOSIS — I2510 Atherosclerotic heart disease of native coronary artery without angina pectoris: Secondary | ICD-10-CM | POA: Diagnosis not present

## 2021-06-20 DIAGNOSIS — I5020 Unspecified systolic (congestive) heart failure: Secondary | ICD-10-CM | POA: Diagnosis not present

## 2021-06-23 DIAGNOSIS — Z20822 Contact with and (suspected) exposure to covid-19: Secondary | ICD-10-CM | POA: Diagnosis not present

## 2021-07-20 DIAGNOSIS — I5020 Unspecified systolic (congestive) heart failure: Secondary | ICD-10-CM | POA: Diagnosis not present

## 2021-07-20 DIAGNOSIS — I255 Ischemic cardiomyopathy: Secondary | ICD-10-CM | POA: Diagnosis not present

## 2021-07-20 DIAGNOSIS — I2129 ST elevation (STEMI) myocardial infarction involving other sites: Secondary | ICD-10-CM | POA: Diagnosis not present

## 2021-07-20 DIAGNOSIS — F0390 Unspecified dementia without behavioral disturbance: Secondary | ICD-10-CM | POA: Diagnosis not present

## 2021-07-20 DIAGNOSIS — Z95820 Peripheral vascular angioplasty status with implants and grafts: Secondary | ICD-10-CM | POA: Diagnosis not present

## 2021-07-20 DIAGNOSIS — R4189 Other symptoms and signs involving cognitive functions and awareness: Secondary | ICD-10-CM | POA: Diagnosis not present

## 2021-07-20 DIAGNOSIS — Z7902 Long term (current) use of antithrombotics/antiplatelets: Secondary | ICD-10-CM | POA: Diagnosis not present

## 2021-07-20 DIAGNOSIS — Z955 Presence of coronary angioplasty implant and graft: Secondary | ICD-10-CM | POA: Diagnosis not present

## 2021-07-20 DIAGNOSIS — I2510 Atherosclerotic heart disease of native coronary artery without angina pectoris: Secondary | ICD-10-CM | POA: Diagnosis not present

## 2021-07-20 DIAGNOSIS — Z8616 Personal history of COVID-19: Secondary | ICD-10-CM | POA: Diagnosis not present

## 2021-08-26 ENCOUNTER — Ambulatory Visit (INDEPENDENT_AMBULATORY_CARE_PROVIDER_SITE_OTHER): Payer: Medicare Other | Admitting: *Deleted

## 2021-08-26 DIAGNOSIS — Z0000 Encounter for general adult medical examination without abnormal findings: Secondary | ICD-10-CM

## 2021-08-26 NOTE — Progress Notes (Signed)
Subjective:   Luke Chen is a 81 y.o. male who presents for Medicare Annual/Subsequent preventive examination.   I discussed the limitations of evaluation and management by telemedicine and the availability of in person appointments. Patient expressed understanding and agreed to proceed.   Visit performed using audio  Patient:home Provider:home    Review of Systems    Defer to provider   Cardiac Risk Factors include: advanced age (>7555men, 11>65 women)     Objective:    There were no vitals filed for this visit. There is no height or weight on file to calculate BMI.  Advanced Directives 08/26/2021 02/05/2019 02/05/2019 05/16/2017  Does Patient Have a Medical Advance Directive? No Yes No No  Type of Advance Directive - Healthcare Power of Attorney - -  Does patient want to make changes to medical advance directive? - No - Patient declined - -  Copy of Healthcare Power of Attorney in Chart? - No - copy requested - -  Would patient like information on creating a medical advance directive? No - Patient declined - - -    Current Medications (verified) Outpatient Encounter Medications as of 08/26/2021  Medication Sig   aspirin EC 81 MG EC tablet Take 1 tablet (81 mg total) by mouth daily.   azithromycin (ZITHROMAX) 250 MG tablet Use as directed   donepezil (ARICEPT) 10 MG tablet TAKE 1 TABLET BY MOUTH EVERY DAY   glimepiride (AMARYL) 1 MG tablet TAKE 1 TABLET (1 MG TOTAL) BY MOUTH DAILY WITH BREAKFAST.   glimepiride (AMARYL) 2 MG tablet TAKE 1 TABLET BY MOUTH EVERY DAY BEFORE BREAKFAST   guaiFENesin (ROBITUSSIN) 100 MG/5ML SOLN Take 5 mLs (100 mg total) by mouth every 6 (six) hours as needed for cough or to loosen phlegm.   lisinopril (ZESTRIL) 5 MG tablet Take 1 tablet (5 mg total) by mouth daily.   lovastatin (MEVACOR) 20 MG tablet Take 1 tablet (20 mg total) by mouth every evening.   metFORMIN (GLUCOPHAGE) 1000 MG tablet TAKE 1 TABLET BY MOUTH TWICE A DAY   ondansetron (ZOFRAN  ODT) 4 MG disintegrating tablet Take 1 tablet (4 mg total) by mouth every 8 (eight) hours as needed for nausea or vomiting.   No facility-administered encounter medications on file as of 08/26/2021.    Allergies (verified) Patient has no known allergies.   History: Past Medical History:  Diagnosis Date   Dementia (HCC)    Diabetes mellitus without complication (HCC)    Hypertension    History reviewed. No pertinent surgical history. History reviewed. No pertinent family history. Social History   Socioeconomic History   Marital status: Unknown    Spouse name: Not on file   Number of children: Not on file   Years of education: Not on file   Highest education level: Not on file  Occupational History   Not on file  Tobacco Use   Smoking status: Never   Smokeless tobacco: Never  Substance and Sexual Activity   Alcohol use: Never   Drug use: Never   Sexual activity: Not Currently  Other Topics Concern   Not on file  Social History Narrative   Not on file   Social Determinants of Health   Financial Resource Strain: Low Risk    Difficulty of Paying Living Expenses: Not hard at all  Food Insecurity: No Food Insecurity   Worried About Running Out of Food in the Last Year: Never true   Ran Out of Food in the Last Year: Never true  Transportation Needs: No Regulatory affairs officer (Medical): No   Lack of Transportation (Non-Medical): No  Physical Activity: Inactive   Days of Exercise per Week: 0 days   Minutes of Exercise per Session: 0 min  Stress: No Stress Concern Present   Feeling of Stress : Only a little  Social Connections: Moderately Isolated   Frequency of Communication with Friends and Family: More than three times a week   Frequency of Social Gatherings with Friends and Family: More than three times a week   Attends Religious Services: Never   Database administrator or Organizations: No   Attends Engineer, structural: Never    Marital Status: Married    Tobacco Counseling Counseling given: Not Answered   Clinical Intake:  Pre-visit preparation completed: Yes  Pain : No/denies pain     Nutritional Risks: None Diabetes: Yes CBG done?: No Did pt. bring in CBG monitor from home?: No  How often do you need to have someone help you when you read instructions, pamphlets, or other written materials from your doctor or pharmacy?: 1 - Never What is the last grade level you completed in school?: 12  Diabetic?no  Interpreter Needed?: No  Information entered by :: Brien Mates   Activities of Daily Living In your present state of health, do you have any difficulty performing the following activities: 08/26/2021  Hearing? Y  Vision? N  Difficulty concentrating or making decisions? Y  Walking or climbing stairs? Y  Dressing or bathing? N  Doing errands, shopping? Y  Preparing Food and eating ? Y  Using the Toilet? N  In the past six months, have you accidently leaked urine? N  Do you have problems with loss of bowel control? N  Managing your Medications? Y  Managing your Finances? Y  Housekeeping or managing your Housekeeping? Y  Some recent data might be hidden    Patient Care Team: P.A., [name], MD as PCP - General (Cardiology)  Indicate any recent Medical Services you may have received from other than [facility] providers in the past year (date may be approximate).     Assessment:   This is a routine wellness examination for Luke Chen.  Hearing/Vision screen No results found.  Dietary issues and exercise activities discussed: Current Exercise Habits: The patient does not participate in regular exercise at present, Exercise limited by: orthopedic condition(s)   Goals Addressed   None    Depression Screen PHQ 2/9 Scores 08/26/2021 08/26/2021 05/31/2020  PHQ - 2 Score 0 0 0    Fall Risk Fall Risk  08/26/2021 05/31/2020  Falls in the past year? 1 0  Number falls in past yr: 1 0   Injury with Fall? 1 0  Risk for fall due to : History of fall(s) -  Follow up Falls evaluation completed -    FALL RISK PREVENTION PERTAINING TO THE HOME:  Any stairs in or around the home? No  If so, are there any without handrails? No  Home free of loose throw rugs in walkways, pet beds, electrical cords, etc? Yes  Adequate lighting in your home to reduce risk of falls? Yes   ASSISTIVE DEVICES UTILIZED TO PREVENT FALLS:  Life alert? No  Use of a cane, walker or w/c? Yes  Grab bars in the bathroom? Yes  Shower chair or bench in shower? Yes  Elevated toilet seat or a handicapped toilet? Yes   TIMED UP AND GO:  Was the test performed? No .  Length of time to ambulate -na    Cognitive Function: MMSE - Mini Mental State Exam 08/26/2021  Not completed: Unable to complete     6CIT Screen 08/26/2021  What Year? 0 points  What month? 0 points  What time? 3 points  Count back from 20 4 points  Months in reverse 4 points  Repeat phrase 10 points  Total Score 21    Immunizations Immunization History  Administered Date(s) Administered   Moderna Sars-Covid-2 Vaccination 10/22/2019, 11/19/2019    TDAP status: Due, Education has been provided regarding the importance of this vaccine. Advised may receive this vaccine at local pharmacy or Health Dept. Aware to provide a copy of the vaccination record if obtained from local pharmacy or Health Dept. Verbalized acceptance and understanding.  Flu Vaccine status: Due, Education has been provided regarding the importance of this vaccine. Advised may receive this vaccine at local pharmacy or Health Dept. Aware to provide a copy of the vaccination record if obtained from local pharmacy or Health Dept. Verbalized acceptance and understanding.  Pneumococcal vaccine status: Due, Education has been provided regarding the importance of this vaccine. Advised may receive this vaccine at local pharmacy or Health Dept. Aware to provide a copy of  the vaccination record if obtained from local pharmacy or Health Dept. Verbalized acceptance and understanding.  Covid-19 vaccine status: Information provided on how to obtain vaccines.   Qualifies for Shingles Vaccine? Yes   Zostavax completed No   Shingrix Completed?: No.    Education has been provided regarding the importance of this vaccine. Patient has been advised to call insurance company to determine out of pocket expense if they have not yet received this vaccine. Advised may also receive vaccine at local pharmacy or Health Dept. Verbalized acceptance and understanding.  Screening Tests Health Maintenance  Topic Date Due   HEMOGLOBIN A1C  Never done   Pneumonia Vaccine 9365+ Years old (1 - PCV) Never done   FOOT EXAM  Never done   Hepatitis C Screening  Never done   TETANUS/TDAP  Never done   Zoster Vaccines- Shingrix (1 of 2) Never done   COVID-19 Vaccine (3 - Moderna risk series) 12/17/2019   OPHTHALMOLOGY EXAM  01/19/2021   INFLUENZA VACCINE  Never done   HPV VACCINES  Aged Out    Health Maintenance  Health Maintenance Due  Topic Date Due   HEMOGLOBIN A1C  Never done   Pneumonia Vaccine 6465+ Years old (1 - PCV) Never done   FOOT EXAM  Never done   Hepatitis C Screening  Never done   TETANUS/TDAP  Never done   Zoster Vaccines- Shingrix (1 of 2) Never done   COVID-19 Vaccine (3 - Moderna risk series) 12/17/2019   OPHTHALMOLOGY EXAM  01/19/2021   INFLUENZA VACCINE  Never done    Colorectal cancer screening: No longer required.   Lung Cancer Screening: (Low Dose CT Chest recommended if Age 23-80 years, 30 pack-year currently smoking OR have quit w/in 15years.) does not qualify.   Lung Cancer Screening Referral: na  Additional Screening:  Hepatitis C Screening: does qualify; Complete with next labs  Vision Screening: Recommended annual ophthalmology exams for early detection of glaucoma and other disorders of the eye. Is the patient up to date with their annual  eye exam?  Yes  Who is the provider or what is the name of the office in which the patient attends annual eye exams? [facility] If pt is not established with a provider, would they  like to be referred to a provider to establish care? No .   Dental Screening: Recommended annual dental exams for proper oral hygiene  Community Resource Referral / Chronic Care Management: CRR required this visit?  No   CCM required this visit?  No      Plan:     I have personally reviewed and noted the following in the patients chart:   Medical and social history Use of alcohol, tobacco or illicit drugs  Current medications and supplements including opioid prescriptions. Patient is not currently taking opioid prescriptions. Functional ability and status Nutritional status Physical activity Advanced directives List of other physicians Hospitalizations, surgeries, and ER visits in previous 12 months Vitals Screenings to include cognitive, depression, and falls Referrals and appointments  In addition, I have reviewed and discussed with patient certain preventive protocols, quality metrics, and best practice recommendations. A written personalized care plan for preventive services as well as general preventive health recommendations were provided to patient.     Melody Comas, [place]   08/26/2021   Nurse Notes:  Luke Chen , Thank you for taking time to come for your Medicare Wellness Visit. I appreciate your ongoing commitment to your health goals. Please review the following plan we discussed and let me know if I can assist you in the future.   These are the goals we discussed:  Goals   None     This is a list of the screening recommended for you and due dates:  Health Maintenance  Topic Date Due   Hemoglobin A1C  Never done   Pneumonia Vaccine (1 - PCV) Never done   Complete foot exam   Never done   Hepatitis C Screening: USPSTF Recommendation to screen - Ages 53-79 yo.  Never done    Tetanus Vaccine  Never done   Zoster (Shingles) Vaccine (1 of 2) Never done   COVID-19 Vaccine (3 - Moderna risk series) 12/17/2019   Eye exam for diabetics  01/19/2021   Flu Shot  Never done   HPV Vaccine  Aged Out

## 2021-08-26 NOTE — Progress Notes (Signed)
I have reviewed this visit and agree with the documentation.   

## 2021-09-08 ENCOUNTER — Other Ambulatory Visit: Payer: Self-pay | Admitting: Internal Medicine

## 2021-09-08 DIAGNOSIS — R2681 Unsteadiness on feet: Secondary | ICD-10-CM

## 2021-09-08 DIAGNOSIS — R269 Unspecified abnormalities of gait and mobility: Secondary | ICD-10-CM

## 2021-09-30 ENCOUNTER — Other Ambulatory Visit: Payer: Self-pay | Admitting: Internal Medicine

## 2021-09-30 DIAGNOSIS — E139 Other specified diabetes mellitus without complications: Secondary | ICD-10-CM

## 2021-10-29 DIAGNOSIS — Z20822 Contact with and (suspected) exposure to covid-19: Secondary | ICD-10-CM | POA: Diagnosis not present

## 2021-11-10 DIAGNOSIS — Z20828 Contact with and (suspected) exposure to other viral communicable diseases: Secondary | ICD-10-CM | POA: Diagnosis not present

## 2021-11-10 DIAGNOSIS — Z20822 Contact with and (suspected) exposure to covid-19: Secondary | ICD-10-CM | POA: Diagnosis not present

## 2021-11-11 DIAGNOSIS — Z20822 Contact with and (suspected) exposure to covid-19: Secondary | ICD-10-CM | POA: Diagnosis not present

## 2021-11-13 DIAGNOSIS — Z20822 Contact with and (suspected) exposure to covid-19: Secondary | ICD-10-CM | POA: Diagnosis not present

## 2021-11-28 DIAGNOSIS — Z20822 Contact with and (suspected) exposure to covid-19: Secondary | ICD-10-CM | POA: Diagnosis not present

## 2021-12-04 ENCOUNTER — Other Ambulatory Visit: Payer: Self-pay | Admitting: Internal Medicine

## 2021-12-10 DIAGNOSIS — Z20822 Contact with and (suspected) exposure to covid-19: Secondary | ICD-10-CM | POA: Diagnosis not present

## 2021-12-14 DIAGNOSIS — Z95820 Peripheral vascular angioplasty status with implants and grafts: Secondary | ICD-10-CM | POA: Diagnosis not present

## 2021-12-14 DIAGNOSIS — I5020 Unspecified systolic (congestive) heart failure: Secondary | ICD-10-CM | POA: Diagnosis not present

## 2021-12-14 DIAGNOSIS — E119 Type 2 diabetes mellitus without complications: Secondary | ICD-10-CM | POA: Diagnosis not present

## 2021-12-14 DIAGNOSIS — I2129 ST elevation (STEMI) myocardial infarction involving other sites: Secondary | ICD-10-CM | POA: Diagnosis not present

## 2021-12-14 DIAGNOSIS — I259 Chronic ischemic heart disease, unspecified: Secondary | ICD-10-CM | POA: Diagnosis not present

## 2021-12-14 DIAGNOSIS — R4189 Other symptoms and signs involving cognitive functions and awareness: Secondary | ICD-10-CM | POA: Diagnosis not present

## 2021-12-14 DIAGNOSIS — I2510 Atherosclerotic heart disease of native coronary artery without angina pectoris: Secondary | ICD-10-CM | POA: Diagnosis not present

## 2021-12-16 DIAGNOSIS — Z20822 Contact with and (suspected) exposure to covid-19: Secondary | ICD-10-CM | POA: Diagnosis not present

## 2021-12-18 DIAGNOSIS — Z20822 Contact with and (suspected) exposure to covid-19: Secondary | ICD-10-CM | POA: Diagnosis not present

## 2021-12-19 DIAGNOSIS — Z20822 Contact with and (suspected) exposure to covid-19: Secondary | ICD-10-CM | POA: Diagnosis not present

## 2021-12-27 DIAGNOSIS — I10 Essential (primary) hypertension: Secondary | ICD-10-CM | POA: Diagnosis not present

## 2021-12-27 DIAGNOSIS — E119 Type 2 diabetes mellitus without complications: Secondary | ICD-10-CM | POA: Diagnosis not present

## 2021-12-27 DIAGNOSIS — N200 Calculus of kidney: Secondary | ICD-10-CM | POA: Diagnosis not present

## 2021-12-27 DIAGNOSIS — Z20822 Contact with and (suspected) exposure to covid-19: Secondary | ICD-10-CM | POA: Diagnosis not present

## 2021-12-27 DIAGNOSIS — Z7901 Long term (current) use of anticoagulants: Secondary | ICD-10-CM | POA: Diagnosis not present

## 2021-12-27 DIAGNOSIS — F0390 Unspecified dementia without behavioral disturbance: Secondary | ICD-10-CM | POA: Diagnosis not present

## 2021-12-27 DIAGNOSIS — Z8673 Personal history of transient ischemic attack (TIA), and cerebral infarction without residual deficits: Secondary | ICD-10-CM | POA: Diagnosis not present

## 2021-12-27 DIAGNOSIS — R310 Gross hematuria: Secondary | ICD-10-CM | POA: Diagnosis not present

## 2021-12-27 DIAGNOSIS — N210 Calculus in bladder: Secondary | ICD-10-CM | POA: Diagnosis not present

## 2021-12-27 DIAGNOSIS — Z87442 Personal history of urinary calculi: Secondary | ICD-10-CM | POA: Diagnosis not present

## 2021-12-28 DIAGNOSIS — N200 Calculus of kidney: Secondary | ICD-10-CM | POA: Diagnosis not present

## 2021-12-28 DIAGNOSIS — N210 Calculus in bladder: Secondary | ICD-10-CM | POA: Diagnosis not present

## 2022-01-02 ENCOUNTER — Encounter: Payer: Self-pay | Admitting: Internal Medicine

## 2022-01-02 ENCOUNTER — Ambulatory Visit (INDEPENDENT_AMBULATORY_CARE_PROVIDER_SITE_OTHER): Payer: Medicare Other | Admitting: Internal Medicine

## 2022-01-02 DIAGNOSIS — G301 Alzheimer's disease with late onset: Secondary | ICD-10-CM

## 2022-01-02 DIAGNOSIS — I10 Essential (primary) hypertension: Secondary | ICD-10-CM

## 2022-01-02 DIAGNOSIS — F0280 Dementia in other diseases classified elsewhere without behavioral disturbance: Secondary | ICD-10-CM | POA: Diagnosis not present

## 2022-01-02 DIAGNOSIS — R319 Hematuria, unspecified: Secondary | ICD-10-CM | POA: Diagnosis not present

## 2022-01-02 DIAGNOSIS — E139 Other specified diabetes mellitus without complications: Secondary | ICD-10-CM

## 2022-01-02 LAB — POCT URINALYSIS DIPSTICK
Appearance: NORMAL
Bilirubin, UA: NEGATIVE
Blood, UA: NEGATIVE
Glucose, UA: NEGATIVE
Ketones, UA: NEGATIVE
Leukocytes, UA: NEGATIVE
Nitrite, UA: NEGATIVE
Protein, UA: NEGATIVE
Spec Grav, UA: 1.025 (ref 1.010–1.025)
Urobilinogen, UA: NEGATIVE E.U./dL — AB
pH, UA: 5.5 (ref 5.0–8.0)

## 2022-01-02 LAB — GLUCOSE, POCT (MANUAL RESULT ENTRY): POC Glucose: 189 mg/dl — AB (ref 70–99)

## 2022-01-02 NOTE — Assessment & Plan Note (Signed)
Diabetes is under control. ?

## 2022-01-02 NOTE — Progress Notes (Signed)
Established Patient Office Visit  Subjective:  Patient ID: Luke Chen, male    DOB: [birth year]  Age: 81 y.o. MRN: [id number]  CC:  Chief Complaint  Patient presents with   Nephrolithiasis    HPI  Luke Chen presents for follow-up on the bladder stone.,  Patient is known to have dementia diabetes hypertension.  He was seen in [facility] for bleeding from the bladder.  He was found to have bladder stone.  He is doing okay right now does not have any fever nausea vomiting stomach pain pain in the flank.  Patient has loss of memory.  We will check his urine again today for hematuria, patient is known to have underlying  dementia, there is no history of smoking or drinking.  There is no history of fever or chills.  Blood pressure 140/76.  Heart is regular chest is clear.  Patient is taking 3 mg of Amaryl for diabetes control.  And also take Plavix and Eliquis as per his specialist.  Blood pressure is stable.  Past Medical History:  Diagnosis Date   Dementia (HCC)    Diabetes mellitus without complication (HCC)    Hypertension     History reviewed. No pertinent surgical history.  History reviewed. No pertinent family history.  Social History   Socioeconomic History   Marital status: Unknown    Spouse name: Not on file   Number of children: Not on file   Years of education: Not on file   Highest education level: Not on file  Occupational History   Not on file  Tobacco Use   Smoking status: Never   Smokeless tobacco: Never  Substance and Sexual Activity   Alcohol use: Never   Drug use: Never   Sexual activity: Not Currently  Other Topics Concern   Not on file  Social History Narrative   Not on file   Social Determinants of Health   Financial Resource Strain: Low Risk    Difficulty of Paying Living Expenses: Not hard at all  Food Insecurity: No Food Insecurity   Worried About Running Out of Food in the Last Year: Never true   Ran Out of Food in the Last Year:  Never true  Transportation Needs: No Transportation Needs   Lack of Transportation (Medical): No   Lack of Transportation (Non-Medical): No  Physical Activity: Inactive   Days of Exercise per Week: 0 days   Minutes of Exercise per Session: 0 min  Stress: No Stress Concern Present   Feeling of Stress : Only a little  Social Connections: Moderately Isolated   Frequency of Communication with Friends and Family: More than three times a week   Frequency of Social Gatherings with Friends and Family: More than three times a week   Attends Religious Services: Never   Database administrator or Organizations: No   Attends Engineer, structural: Never   Marital Status: Married  Catering manager Violence: Not At Risk   Fear of Current or Ex-Partner: No   Emotionally Abused: No   Physically Abused: No   Sexually Abused: No     Current Outpatient Medications:    aspirin EC 81 MG EC tablet, Take 1 tablet (81 mg total) by mouth daily., Disp: 30 tablet, Rfl: 0   donepezil (ARICEPT) 10 MG tablet, TAKE 1 TABLET BY MOUTH EVERY DAY, Disp: 90 tablet, Rfl: 2   donepezil (ARICEPT) 10 MG tablet, Take 10 mg by mouth daily at 2 PM., Disp: , Rfl:  glimepiride (AMARYL) 1 MG tablet, TAKE 1 TABLET (1 MG TOTAL) BY MOUTH DAILY WITH BREAKFAST., Disp: 90 tablet, Rfl: 2   glimepiride (AMARYL) 2 MG tablet, TAKE 1 TABLET BY MOUTH EVERY DAY BEFORE BREAKFAST, Disp: 90 tablet, Rfl: 1   lisinopril (ZESTRIL) 5 MG tablet, Take 1 tablet (5 mg total) by mouth daily., Disp: 90 tablet, Rfl: 3   lovastatin (MEVACOR) 20 MG tablet, Take 1 tablet (20 mg total) by mouth every evening., Disp: 90 tablet, Rfl: 3   metFORMIN (GLUCOPHAGE) 1000 MG tablet, TAKE 1 TABLET BY MOUTH TWICE A DAY, Disp: 180 tablet, Rfl: 1   ascorbic acid (VITAMIN C) 1000 MG tablet, Take 1,000 mg by mouth daily at 2 PM., Disp: , Rfl:    clopidogrel (PLAVIX) 75 MG tablet, Take 75 mg by mouth daily., Disp: , Rfl:    ELIQUIS 5 MG TABS tablet, Take 5 mg by  mouth 2 (two) times daily., Disp: , Rfl:    No Known Allergies  ROS Review of Systems  Constitutional: Negative.   HENT: Negative.    Eyes: Negative.   Respiratory: Negative.    Cardiovascular: Negative.   Gastrointestinal: Negative.   Endocrine: Negative.   Genitourinary: Negative.   Musculoskeletal: Negative.   Skin: Negative.   Allergic/Immunologic: Negative.   Neurological: Negative.   Hematological: Negative.   Psychiatric/Behavioral: Negative.    All other systems reviewed and are negative.    Objective:    Physical Exam Vitals reviewed.  Constitutional:      Appearance: Normal appearance.  HENT:     Mouth/Throat:     Mouth: Mucous membranes are moist.  Eyes:     Pupils: Pupils are equal, round, and reactive to light.  Neck:     Vascular: No carotid bruit.  Cardiovascular:     Rate and Rhythm: Normal rate and regular rhythm.     Pulses: Normal pulses.     Heart sounds: Normal heart sounds.  Pulmonary:     Effort: Pulmonary effort is normal.     Breath sounds: Normal breath sounds.  Abdominal:     General: Bowel sounds are normal.     Palpations: Abdomen is soft. There is no hepatomegaly, splenomegaly or mass.     Tenderness: There is no abdominal tenderness.     Hernia: No hernia is present.  Musculoskeletal:     Cervical back: Neck supple.     Right lower leg: No edema.     Left lower leg: No edema.  Skin:    Findings: No rash.  Neurological:     Mental Status: He is alert and oriented to person, place, and time.     Motor: No weakness.  Psychiatric:        Mood and Affect: Mood normal.        Behavior: Behavior normal.    There were no vitals taken for this visit. Wt Readings from Last 3 Encounters:  11/22/20 164 lb 14.4 oz (74.8 kg)  05/31/20 168 lb 8 oz (76.4 kg)  05/07/20 157 lb (71.2 kg)     Health Maintenance Due  Topic Date Due   HEMOGLOBIN A1C  Never done   FOOT EXAM  Never done   Hepatitis C Screening  Never done    TETANUS/TDAP  Never done   Zoster Vaccines- Shingrix (1 of 2) Never done   Pneumonia Vaccine 4865+ Years old (1 - PCV) Never done   COVID-19 Vaccine (3 - Moderna risk series) 12/17/2019   OPHTHALMOLOGY EXAM  01/19/2021    There are no preventive care reminders to display for this patient.  No results found for: TSH Lab Results  Component Value Date   WBC 7.0 02/06/2019   HGB 13.7 02/06/2019   HCT 41.5 02/06/2019   MCV 92.4 02/06/2019   PLT 173 02/06/2019   Lab Results  Component Value Date   NA 142 02/06/2019   K 3.4 (L) 02/06/2019   CO2 25 02/06/2019   GLUCOSE 115 (H) 02/06/2019   BUN 13 02/06/2019   CREATININE 0.65 02/06/2019   BILITOT 0.6 02/05/2019   ALKPHOS 56 02/05/2019   AST 21 02/05/2019   ALT 17 02/05/2019   PROT 6.9 02/05/2019   ALBUMIN 4.1 02/05/2019   CALCIUM 8.4 (L) 02/06/2019   ANIONGAP 9 02/06/2019   Lab Results  Component Value Date   CHOL 121 02/06/2019   Lab Results  Component Value Date   HDL 51 02/06/2019   Lab Results  Component Value Date   LDLCALC 54 02/06/2019   Lab Results  Component Value Date   TRIG 81 02/06/2019   Lab Results  Component Value Date   CHOLHDL 2.4 02/06/2019   No results found for: HGBA1C    Assessment & Plan:   Problem List Items Addressed This Visit       Cardiovascular and Mediastinum   Essential hypertension    Blood pressure is stable.       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       Relevant Orders   POCT glucose (manual entry) (Completed)     Nervous and Auditory   Late onset Alzheimer's disease without behavioral disturbance (HCC)    Dementia and memory loss is worsening progressively       Relevant Medications   donepezil (ARICEPT) 10 MG tablet     Other   Hematuria    We will check his urine again today       Relevant Orders   POCT urinalysis dipstick    No orders  of the defined types were placed in this encounter.   Follow-up: No follow-ups on file.    [name], MD

## 2022-01-02 NOTE — Assessment & Plan Note (Signed)
Blood pressure is stable 

## 2022-01-02 NOTE — Assessment & Plan Note (Signed)
Dementia and memory loss is worsening progressively

## 2022-01-02 NOTE — Assessment & Plan Note (Signed)
We will check his urine again today

## 2022-02-01 ENCOUNTER — Ambulatory Visit (INDEPENDENT_AMBULATORY_CARE_PROVIDER_SITE_OTHER): Payer: Medicare Other | Admitting: Internal Medicine

## 2022-02-01 ENCOUNTER — Encounter: Payer: Self-pay | Admitting: Internal Medicine

## 2022-02-01 VITALS — BP 152/78 | HR 64 | Ht 68.0 in | Wt 150.4 lb

## 2022-02-01 DIAGNOSIS — E139 Other specified diabetes mellitus without complications: Secondary | ICD-10-CM | POA: Diagnosis not present

## 2022-02-01 DIAGNOSIS — E119 Type 2 diabetes mellitus without complications: Secondary | ICD-10-CM

## 2022-02-01 DIAGNOSIS — I10 Essential (primary) hypertension: Secondary | ICD-10-CM

## 2022-02-01 DIAGNOSIS — F0280 Dementia in other diseases classified elsewhere without behavioral disturbance: Secondary | ICD-10-CM

## 2022-02-01 DIAGNOSIS — G301 Alzheimer's disease with late onset: Secondary | ICD-10-CM

## 2022-02-01 DIAGNOSIS — R319 Hematuria, unspecified: Secondary | ICD-10-CM

## 2022-02-01 LAB — GLUCOSE, POCT (MANUAL RESULT ENTRY): POC Glucose: 185 mg/dl — AB (ref 70–99)

## 2022-02-01 LAB — POCT GLYCOSYLATED HEMOGLOBIN (HGB A1C): HbA1c POC (<> result, manual entry): 7.6 % (ref 4.0–5.6)

## 2022-02-01 MED ORDER — GLIMEPIRIDE 2 MG PO TABS: 2.0000 mg | ORAL_TABLET | Freq: Every day | ORAL | 1 refills | Status: DC

## 2022-02-01 NOTE — Assessment & Plan Note (Signed)
No change in the memory loss patient can walk without the help of his stick chest is clear heart is regular no swelling of the legs

## 2022-02-01 NOTE — Assessment & Plan Note (Signed)
Patient has a history of kidney stone no more hematuria is noted.

## 2022-02-01 NOTE — Assessment & Plan Note (Signed)
Blood pressure is stable at the present time 

## 2022-02-01 NOTE — Assessment & Plan Note (Signed)
We will give Amaryl 2 mg p.o. daily

## 2022-02-01 NOTE — Progress Notes (Signed)
Established Patient Office Visit  Subjective:  Patient ID: Luke Chen, male    DOB: 1941-05-10  Age: 81 y.o. MRN: [id number]  CC:  Chief Complaint  Patient presents with   Diabetes    Diabetes He presents for his follow-up diabetic visit. He has type 2 diabetes mellitus. Hypoglycemia symptoms include mood changes and nervousness/anxiousness. Pertinent negatives for hypoglycemia include no dizziness or headaches. Associated symptoms include fatigue. Pertinent negatives for diabetes include no blurred vision and no chest pain. Pertinent negatives for hypoglycemia complications include no blackouts. Diabetic complications include a CVA and heart disease. Risk factors for coronary artery disease include diabetes mellitus.    Luke Chen presents for general  check  Past Medical History:  Diagnosis Date   Dementia (HCC)    Diabetes mellitus without complication (HCC)    Hypertension     History reviewed. No pertinent surgical history.  History reviewed. No pertinent family history.  Social History   Socioeconomic History   Marital status: Unknown    Spouse name: Not on file   Number of children: Not on file   Years of education: Not on file   Highest education level: Not on file  Occupational History   Not on file  Tobacco Use   Smoking status: Never   Smokeless tobacco: Never  Substance and Sexual Activity   Alcohol use: Never   Drug use: Never   Sexual activity: Not Currently  Other Topics Concern   Not on file  Social History Narrative   Not on file   Social Determinants of Health   Financial Resource Strain: Low Risk  (08/26/2021)   Overall Financial Resource Strain (CARDIA)    Difficulty of Paying Living Expenses: Not hard at all  Food Insecurity: No Food Insecurity (08/26/2021)   Hunger Vital Sign    Worried About Running Out of Food in the Last Year: Never true    Ran Out of Food in the Last Year: Never true  Transportation Needs: No Transportation  Needs (08/26/2021)   PRAPARE - Administrator, Civil Service (Medical): No    Lack of Transportation (Non-Medical): No  Physical Activity: Inactive (08/26/2021)   Exercise Vital Sign    Days of Exercise per Week: 0 days    Minutes of Exercise per Session: 0 min  Stress: No Stress Concern Present (08/26/2021)   [employer] of Occupational Health - Occupational Stress Questionnaire    Feeling of Stress : Only a little  Social Connections: Moderately Isolated (08/26/2021)   Social Connection and Isolation Panel [NHANES]    Frequency of Communication with Friends and Family: More than three times a week    Frequency of Social Gatherings with Friends and Family: More than three times a week    Attends Religious Services: Never    Database administrator or Organizations: No    Attends Banker Meetings: Never    Marital Status: Married  Catering manager Violence: Not At Risk (08/26/2021)   Humiliation, Afraid, Rape, and Kick questionnaire    Fear of Current or Ex-Partner: No    Emotionally Abused: No    Physically Abused: No    Sexually Abused: No     Current Outpatient Medications:    ascorbic acid (VITAMIN C) 1000 MG tablet, Take 1,000 mg by mouth daily at 2 PM., Disp: , Rfl:    aspirin EC 81 MG EC tablet, Take 1 tablet (81 mg total) by mouth daily., Disp: 30 tablet, Rfl: 0  clopidogrel (PLAVIX) 75 MG tablet, Take 75 mg by mouth daily., Disp: , Rfl:    donepezil (ARICEPT) 10 MG tablet, TAKE 1 TABLET BY MOUTH EVERY DAY, Disp: 90 tablet, Rfl: 2   donepezil (ARICEPT) 10 MG tablet, Take 10 mg by mouth daily at 2 PM., Disp: , Rfl:    ELIQUIS 5 MG TABS tablet, Take 5 mg by mouth 2 (two) times daily., Disp: , Rfl:    lisinopril (ZESTRIL) 5 MG tablet, Take 1 tablet (5 mg total) by mouth daily., Disp: 90 tablet, Rfl: 3   lovastatin (MEVACOR) 20 MG tablet, Take 1 tablet (20 mg total) by mouth every evening., Disp: 90 tablet, Rfl: 3   metFORMIN (GLUCOPHAGE) 1000 MG  tablet, TAKE 1 TABLET BY MOUTH TWICE A DAY, Disp: 180 tablet, Rfl: 1   No Known Allergies  ROS Review of Systems  Constitutional:  Positive for fatigue.  HENT:  Negative for ear discharge.   Eyes:  Negative for blurred vision.  Respiratory:  Negative for chest tightness.   Cardiovascular:  Negative for chest pain and leg swelling.  Gastrointestinal:  Negative for abdominal distention.  Genitourinary:  Negative for dysuria.  Musculoskeletal:  Negative for arthralgias.  Neurological:  Negative for dizziness and headaches.  Psychiatric/Behavioral:  The patient is nervous/anxious.       Objective:    Physical Exam HENT:     Head: Normocephalic.     Nose: Nose normal.  Eyes:     Pupils: Pupils are equal, round, and reactive to light.  Cardiovascular:     Rate and Rhythm: Normal rate.  Pulmonary:     Effort: Pulmonary effort is normal.  Abdominal:     Palpations: Abdomen is soft.  Musculoskeletal:     Cervical back: Normal range of motion.  Skin:    General: Skin is warm.  Neurological:     General: No focal deficit present.     Mental Status: He is alert.     BP (!) 152/78   Pulse 64   Ht 5\' 8"  (1.727 m)   Wt 150 lb 6.4 oz (68.2 kg)   BMI 22.87 kg/m  Wt Readings from Last 3 Encounters:  02/01/22 150 lb 6.4 oz (68.2 kg)  11/22/20 164 lb 14.4 oz (74.8 kg)  05/31/20 168 lb 8 oz (76.4 kg)     Health Maintenance Due  Topic Date Due   FOOT EXAM  Never done   Hepatitis C Screening  Never done   TETANUS/TDAP  Never done   Zoster Vaccines- Shingrix (1 of 2) Never done   Pneumonia Vaccine 44+ Years old (1 - PCV) Never done   COVID-19 Vaccine (3 - Moderna risk series) 12/17/2019   OPHTHALMOLOGY EXAM  01/19/2021    There are no preventive care reminders to display for this patient.  No results found for: "TSH" Lab Results  Component Value Date   WBC 7.0 02/06/2019   HGB 13.7 02/06/2019   HCT 41.5 02/06/2019   MCV 92.4 02/06/2019   PLT 173 02/06/2019   Lab  Results  Component Value Date   NA 142 02/06/2019   K 3.4 (L) 02/06/2019   CO2 25 02/06/2019   GLUCOSE 115 (H) 02/06/2019   BUN 13 02/06/2019   CREATININE 0.65 02/06/2019   BILITOT 0.6 02/05/2019   ALKPHOS 56 02/05/2019   AST 21 02/05/2019   ALT 17 02/05/2019   PROT 6.9 02/05/2019   ALBUMIN 4.1 02/05/2019   CALCIUM 8.4 (L) 02/06/2019   ANIONGAP 9  02/06/2019   Lab Results  Component Value Date   CHOL 121 02/06/2019   Lab Results  Component Value Date   HDL 51 02/06/2019   Lab Results  Component Value Date   LDLCALC 54 02/06/2019   Lab Results  Component Value Date   TRIG 81 02/06/2019   Lab Results  Component Value Date   CHOLHDL 2.4 02/06/2019   Lab Results  Component Value Date   HGBA1C 7.6 02/01/2022      Assessment & Plan:   Problem List Items Addressed This Visit       Cardiovascular and Mediastinum   Essential hypertension    Blood pressure is stable at the present time        Endocrine   Diabetes 1.5, managed as type 2 (HCC)    We will give Amaryl 2 mg p.o. daily        Nervous and Auditory   Late onset Alzheimer's disease without behavioral disturbance (HCC)    No change in the memory loss patient can walk without the help of his stick chest is clear heart is regular no swelling of the legs        Other   Hematuria    Patient has a history of kidney stone no more hematuria is noted.      Other Visit Diagnoses     Type 2 diabetes mellitus without complication, without long-term current use of insulin (HCC)    -  Primary   Relevant Orders   POCT HgB A1C (Completed)   POCT glucose (manual entry) (Completed)       No orders of the defined types were placed in this encounter.   Follow-up: No follow-ups on file.    [name], MD

## 2022-04-03 ENCOUNTER — Encounter: Payer: Self-pay | Admitting: Internal Medicine

## 2022-04-03 ENCOUNTER — Ambulatory Visit (INDEPENDENT_AMBULATORY_CARE_PROVIDER_SITE_OTHER): Payer: Medicare Other | Admitting: Internal Medicine

## 2022-04-03 VITALS — BP 123/76 | HR 72 | Ht 68.0 in | Wt 143.8 lb

## 2022-04-03 DIAGNOSIS — R319 Hematuria, unspecified: Secondary | ICD-10-CM | POA: Diagnosis not present

## 2022-04-03 DIAGNOSIS — F0280 Dementia in other diseases classified elsewhere without behavioral disturbance: Secondary | ICD-10-CM

## 2022-04-03 DIAGNOSIS — G301 Alzheimer's disease with late onset: Secondary | ICD-10-CM

## 2022-04-03 DIAGNOSIS — I10 Essential (primary) hypertension: Secondary | ICD-10-CM | POA: Diagnosis not present

## 2022-04-03 DIAGNOSIS — E119 Type 2 diabetes mellitus without complications: Secondary | ICD-10-CM

## 2022-04-03 DIAGNOSIS — E139 Other specified diabetes mellitus without complications: Secondary | ICD-10-CM | POA: Diagnosis not present

## 2022-04-03 LAB — GLUCOSE, POCT (MANUAL RESULT ENTRY): POC Glucose: 201 mg/dl — AB (ref 70–99)

## 2022-04-03 NOTE — Assessment & Plan Note (Signed)
No behavior problem

## 2022-04-03 NOTE — Progress Notes (Signed)
Established Patient Office Visit  Subjective:  Patient ID: Luke Chen, male    DOB: [birth year]  Age: 81 y.o. MRN: [id number]  CC:  Chief Complaint  Patient presents with   Diabetes    Diabetes Pertinent negatives for hypoglycemia include no headaches. Associated symptoms include fatigue. Pertinent negatives for diabetes include no chest pain.    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 No pertinent surgical history.  History reviewed. No pertinent family history.  Social History   Socioeconomic History   Marital status: Unknown    Spouse name: Not on file   Number of children: Not on file   Years of education: Not on file   Highest education level: Not on file  Occupational History   Not on file  Tobacco Use   Smoking status: Never   Smokeless tobacco: Never  Substance and Sexual Activity   Alcohol use: Never   Drug use: Never   Sexual activity: Not Currently  Other Topics Concern   Not on file  Social History Narrative   Not on file   Social Determinants of Health   Financial Resource Strain: Low Risk  (08/26/2021)   Overall Financial Resource Strain (CARDIA)    Difficulty of Paying Living Expenses: Not hard at all  Food Insecurity: No Food Insecurity (08/26/2021)   Hunger Vital Sign    Worried About Running Out of Food in the Last Year: Never true    Ran Out of Food in the Last Year: Never true  Transportation Needs: No Transportation Needs (08/26/2021)   PRAPARE - Administrator, Civil Service (Medical): No    Lack of Transportation (Non-Medical): No  Physical Activity: Inactive (08/26/2021)   Exercise Vital Sign    Days of Exercise per Week: 0 days    Minutes of Exercise per Session: 0 min  Stress: No Stress Concern Present (08/26/2021)   [employer]  of Occupational Health - Occupational Stress Questionnaire    Feeling of Stress : Only a little  Social Connections: Moderately Isolated (08/26/2021)   Social Connection and Isolation Panel [NHANES]    Frequency of Communication with Friends and Family: More than three times a week    Frequency of Social Gatherings with Friends and Family: More than three times a week    Attends Religious Services: Never    Database administrator or Organizations: No    Attends Banker Meetings: Never    Marital Status: Married  Catering manager Violence: Not At Risk (08/26/2021)   Humiliation, Afraid, Rape, and Kick questionnaire    Fear of Current or Ex-Partner: No    Emotionally Abused: No    Physically Abused: No    Sexually Abused: No     Current Outpatient Medications:    ascorbic acid (VITAMIN C) 1000 MG tablet, Take 1,000 mg by mouth daily at 2 PM., Disp: , Rfl:    aspirin EC 81 MG EC tablet, Take 1 tablet (81 mg total) by mouth daily., Disp: 30 tablet, Rfl: 0   clopidogrel (PLAVIX) 75 MG tablet, Take 75 mg by mouth daily., Disp: , Rfl:    donepezil (ARICEPT) 10 MG tablet, TAKE 1 TABLET BY MOUTH EVERY DAY, Disp: 90 tablet, Rfl: 2   donepezil (ARICEPT) 10 MG tablet, Take  10 mg by mouth daily at 2 PM., Disp: , Rfl:    ELIQUIS 5 MG TABS tablet, Take 5 mg by mouth 2 (two) times daily., Disp: , Rfl:    glimepiride (AMARYL) 2 MG tablet, Take 1 tablet (2 mg total) by mouth daily at 6 (six) AM., Disp: 90 tablet, Rfl: 1   lisinopril (ZESTRIL) 5 MG tablet, Take 1 tablet (5 mg total) by mouth daily., Disp: 90 tablet, Rfl: 3   lovastatin (MEVACOR) 20 MG tablet, Take 1 tablet (20 mg total) by mouth every evening., Disp: 90 tablet, Rfl: 3   metFORMIN (GLUCOPHAGE) 1000 MG tablet, TAKE 1 TABLET BY MOUTH TWICE A DAY, Disp: 180 tablet, Rfl: 1   No Known Allergies  ROS Review of Systems  Constitutional:  Positive for fatigue. Negative for activity change, appetite change and fever.  HENT:   Negative for congestion.   Respiratory: Negative.    Cardiovascular:  Negative for chest pain, palpitations and leg swelling.  Gastrointestinal:  Negative for abdominal distention.  Genitourinary:  Negative for difficulty urinating and dysuria.  Neurological:  Negative for headaches.      Objective:    Physical Exam Cardiovascular:     Rate and Rhythm: Rhythm irregular.     Heart sounds: No murmur heard.    No gallop.  Abdominal:     General: Abdomen is flat.     Palpations: Abdomen is soft.  Neurological:     General: No focal deficit present.     Mental Status: Mental status is at baseline.     Motor: Weakness present.     Gait: Gait abnormal.  Psychiatric:        Attention and Perception: He is inattentive.        Mood and Affect: Mood is depressed.        Speech: He is noncommunicative.        Behavior: Behavior is withdrawn. Behavior is cooperative.        Thought Content: Thought content is not delusional. Thought content does not include homicidal or suicidal ideation. Thought content does not include homicidal or suicidal plan.     BP 123/76   Pulse 72   Ht 5\' 8"  (1.727 m)   Wt 143 lb 12.8 oz (65.2 kg)   BMI 21.86 kg/m  Wt Readings from Last 3 Encounters:  04/03/22 143 lb 12.8 oz (65.2 kg)  02/01/22 150 lb 6.4 oz (68.2 kg)  11/22/20 164 lb 14.4 oz (74.8 kg)     Health Maintenance Due  Topic Date Due   FOOT EXAM  Never done   Hepatitis C Screening  Never done   TETANUS/TDAP  Never done   Zoster Vaccines- Shingrix (1 of 2) Never done   Pneumonia Vaccine 3+ Years old (1 - PCV) Never done   COVID-19 Vaccine (3 - Moderna risk series) 12/17/2019   OPHTHALMOLOGY EXAM  01/19/2021   INFLUENZA VACCINE  03/14/2022    There are no preventive care reminders to display for this patient.  No results found for: "TSH" Lab Results  Component Value Date   WBC 7.0 02/06/2019   HGB 13.7 02/06/2019   HCT 41.5 02/06/2019   MCV 92.4 02/06/2019   PLT 173 02/06/2019    Lab Results  Component Value Date   NA 142 02/06/2019   K 3.4 (L) 02/06/2019   CO2 25 02/06/2019   GLUCOSE 115 (H) 02/06/2019   BUN 13 02/06/2019   CREATININE 0.65 02/06/2019   BILITOT 0.6 02/05/2019  ALKPHOS 56 02/05/2019   AST 21 02/05/2019   ALT 17 02/05/2019   PROT 6.9 02/05/2019   ALBUMIN 4.1 02/05/2019   CALCIUM 8.4 (L) 02/06/2019   ANIONGAP 9 02/06/2019   Lab Results  Component Value Date   CHOL 121 02/06/2019   Lab Results  Component Value Date   HDL 51 02/06/2019   Lab Results  Component Value Date   LDLCALC 54 02/06/2019   Lab Results  Component Value Date   TRIG 81 02/06/2019   Lab Results  Component Value Date   CHOLHDL 2.4 02/06/2019   Lab Results  Component Value Date   HGBA1C 7.6 02/01/2022      Assessment & Plan:   Problem List Items Addressed This Visit       Cardiovascular and Mediastinum   Essential hypertension     Patient denies any chest pain or shortness of breath there is no history of palpitation or paroxysmal nocturnal dyspnea   patient was advised to follow low-salt low-cholesterol diet    ideally I want to keep systolic blood pressure below 914 mmHg, patient was asked to check blood pressure one times a week and give me a report on that.  Patient will be follow-up in 3 months  or earlier as needed, patient will call me back for any change in the cardiovascular symptoms Patient was advised to buy a book from local bookstore concerning blood pressure and read several chapters  every day.  This will be supplemented by some of the material we will give him from the office.  Patient should also utilize other resources like YouTube and Internet to learn more about the blood pressure and the diet.        Endocrine   Diabetes 1.5, managed as type 2 (HCC)    - The patient's blood sugar is labile on med. - The patient will continue the current treatment regimen.  - I encouraged the patient to regularly check blood sugar.  - I  encouraged the patient to monitor diet. I encouraged the patient to eat low-carb and low-sugar to help prevent blood sugar spikes.  - I encouraged the patient to continue following their prescribed treatment plan for diabetes - I informed the patient to get help if blood sugar drops below 54mg /dL, or if suddenly have trouble thinking clearly or breathing.  Patient was advised to buy a book on diabetes from a local bookstore or from .  Patient should read 2 chapters every day to keep the motivation going, this is in addition to some of the materials we provided them from the office.  There are other resources on the Internet like YouTube and wilkipedia to get an education on the diabetes        Nervous and Auditory   Late onset Alzheimer's disease without behavioral disturbance (HCC)    No behavior problem        Other   Hematuria    Hematuria has resolved      Other Visit Diagnoses     Type 2 diabetes mellitus without complication, without long-term current use of insulin (HCC)    -  Primary   Relevant Orders   POCT glucose (manual entry) (Completed)       No orders of the defined types were placed in this encounter.   Follow-up: No follow-ups on file.    [place], MD

## 2022-04-03 NOTE — Assessment & Plan Note (Signed)
Hematuria has resolved.

## 2022-04-03 NOTE — Assessment & Plan Note (Signed)

## 2022-04-03 NOTE — Assessment & Plan Note (Signed)

## 2022-06-08 ENCOUNTER — Other Ambulatory Visit: Payer: Self-pay | Admitting: Internal Medicine

## 2022-06-14 ENCOUNTER — Other Ambulatory Visit: Payer: Self-pay | Admitting: Internal Medicine

## 2022-06-14 DIAGNOSIS — R2681 Unsteadiness on feet: Secondary | ICD-10-CM

## 2022-06-14 DIAGNOSIS — R269 Unspecified abnormalities of gait and mobility: Secondary | ICD-10-CM

## 2022-06-16 DIAGNOSIS — I2510 Atherosclerotic heart disease of native coronary artery without angina pectoris: Secondary | ICD-10-CM | POA: Diagnosis not present

## 2022-06-16 DIAGNOSIS — I083 Combined rheumatic disorders of mitral, aortic and tricuspid valves: Secondary | ICD-10-CM | POA: Diagnosis not present

## 2022-06-16 DIAGNOSIS — I5020 Unspecified systolic (congestive) heart failure: Secondary | ICD-10-CM | POA: Diagnosis not present

## 2022-06-21 DIAGNOSIS — E0865 Diabetes mellitus due to underlying condition with hyperglycemia: Secondary | ICD-10-CM | POA: Diagnosis not present

## 2022-06-21 DIAGNOSIS — I259 Chronic ischemic heart disease, unspecified: Secondary | ICD-10-CM | POA: Diagnosis not present

## 2022-06-21 DIAGNOSIS — I2510 Atherosclerotic heart disease of native coronary artery without angina pectoris: Secondary | ICD-10-CM | POA: Diagnosis not present

## 2022-06-21 DIAGNOSIS — R4189 Other symptoms and signs involving cognitive functions and awareness: Secondary | ICD-10-CM | POA: Diagnosis not present

## 2022-06-21 DIAGNOSIS — I2129 ST elevation (STEMI) myocardial infarction involving other sites: Secondary | ICD-10-CM | POA: Diagnosis not present

## 2022-06-21 DIAGNOSIS — I5020 Unspecified systolic (congestive) heart failure: Secondary | ICD-10-CM | POA: Diagnosis not present

## 2022-06-21 DIAGNOSIS — Z95820 Peripheral vascular angioplasty status with implants and grafts: Secondary | ICD-10-CM | POA: Diagnosis not present

## 2022-07-11 ENCOUNTER — Ambulatory Visit: Payer: Medicare Other | Admitting: Internal Medicine

## 2022-09-14 ENCOUNTER — Other Ambulatory Visit: Payer: Self-pay | Admitting: Internal Medicine

## 2022-09-14 DIAGNOSIS — E139 Other specified diabetes mellitus without complications: Secondary | ICD-10-CM

## 2023-12-18 ENCOUNTER — Emergency Department

## 2023-12-18 ENCOUNTER — Inpatient Hospital Stay
Admission: EM | Admit: 2023-12-18 | Discharge: 2023-12-23 | DRG: 871 | Disposition: A | Discharge status: Home Health | Attending: Internal Medicine | Admitting: Internal Medicine

## 2023-12-18 ENCOUNTER — Other Ambulatory Visit: Payer: Self-pay

## 2023-12-18 DIAGNOSIS — E860 Dehydration: Secondary | ICD-10-CM | POA: Diagnosis present

## 2023-12-18 DIAGNOSIS — Z955 Presence of coronary angioplasty implant and graft: Secondary | ICD-10-CM

## 2023-12-18 DIAGNOSIS — L89311 Pressure ulcer of right buttock, stage 1: Secondary | ICD-10-CM | POA: Diagnosis present

## 2023-12-18 DIAGNOSIS — I2510 Atherosclerotic heart disease of native coronary artery without angina pectoris: Secondary | ICD-10-CM | POA: Diagnosis present

## 2023-12-18 DIAGNOSIS — Z794 Long term (current) use of insulin: Secondary | ICD-10-CM

## 2023-12-18 DIAGNOSIS — Z7902 Long term (current) use of antithrombotics/antiplatelets: Secondary | ICD-10-CM | POA: Diagnosis not present

## 2023-12-18 DIAGNOSIS — Z8673 Personal history of transient ischemic attack (TIA), and cerebral infarction without residual deficits: Secondary | ICD-10-CM

## 2023-12-18 DIAGNOSIS — Z7982 Long term (current) use of aspirin: Secondary | ICD-10-CM

## 2023-12-18 DIAGNOSIS — N390 Urinary tract infection, site not specified: Secondary | ICD-10-CM | POA: Insufficient documentation

## 2023-12-18 DIAGNOSIS — Z87442 Personal history of urinary calculi: Secondary | ICD-10-CM

## 2023-12-18 DIAGNOSIS — G9341 Metabolic encephalopathy: Secondary | ICD-10-CM | POA: Diagnosis present

## 2023-12-18 DIAGNOSIS — B964 Proteus (mirabilis) (morganii) as the cause of diseases classified elsewhere: Secondary | ICD-10-CM | POA: Diagnosis present

## 2023-12-18 DIAGNOSIS — A419 Sepsis, unspecified organism: Principal | ICD-10-CM | POA: Diagnosis present

## 2023-12-18 DIAGNOSIS — E1110 Type 2 diabetes mellitus with ketoacidosis without coma: Secondary | ICD-10-CM | POA: Diagnosis present

## 2023-12-18 DIAGNOSIS — Z1152 Encounter for screening for COVID-19: Secondary | ICD-10-CM

## 2023-12-18 DIAGNOSIS — I110 Hypertensive heart disease with heart failure: Secondary | ICD-10-CM | POA: Diagnosis present

## 2023-12-18 DIAGNOSIS — Z7901 Long term (current) use of anticoagulants: Secondary | ICD-10-CM

## 2023-12-18 DIAGNOSIS — E669 Obesity, unspecified: Secondary | ICD-10-CM | POA: Diagnosis present

## 2023-12-18 DIAGNOSIS — J301 Allergic rhinitis due to pollen: Secondary | ICD-10-CM | POA: Diagnosis present

## 2023-12-18 DIAGNOSIS — Z79899 Other long term (current) drug therapy: Secondary | ICD-10-CM

## 2023-12-18 DIAGNOSIS — R531 Weakness: Principal | ICD-10-CM

## 2023-12-18 DIAGNOSIS — F0390 Unspecified dementia without behavioral disturbance: Secondary | ICD-10-CM | POA: Diagnosis present

## 2023-12-18 DIAGNOSIS — R262 Difficulty in walking, not elsewhere classified: Secondary | ICD-10-CM | POA: Diagnosis present

## 2023-12-18 DIAGNOSIS — N179 Acute kidney failure, unspecified: Secondary | ICD-10-CM | POA: Diagnosis not present

## 2023-12-18 DIAGNOSIS — E785 Hyperlipidemia, unspecified: Secondary | ICD-10-CM | POA: Diagnosis present

## 2023-12-18 DIAGNOSIS — Z6832 Body mass index (BMI) 32.0-32.9, adult: Secondary | ICD-10-CM

## 2023-12-18 DIAGNOSIS — I5022 Chronic systolic (congestive) heart failure: Secondary | ICD-10-CM | POA: Diagnosis present

## 2023-12-18 DIAGNOSIS — N136 Pyonephrosis: Secondary | ICD-10-CM | POA: Diagnosis present

## 2023-12-18 DIAGNOSIS — Z7984 Long term (current) use of oral hypoglycemic drugs: Secondary | ICD-10-CM

## 2023-12-18 DIAGNOSIS — Z87440 Personal history of urinary (tract) infections: Secondary | ICD-10-CM

## 2023-12-18 DIAGNOSIS — R4182 Altered mental status, unspecified: Secondary | ICD-10-CM | POA: Insufficient documentation

## 2023-12-18 LAB — BETA-HYDROXYBUTYRIC ACID
Beta-Hydroxybutyric Acid: 0.56 mmol/L — ABNORMAL HIGH (ref 0.05–0.27)
Beta-Hydroxybutyric Acid: 0.17 mmol/L (ref 0.05–0.27)
Beta-Hydroxybutyric Acid: 0.29 mmol/L — ABNORMAL HIGH (ref 0.05–0.27)
Beta-Hydroxybutyric Acid: 0.42 mmol/L — ABNORMAL HIGH (ref 0.05–0.27)
Beta-Hydroxybutyric Acid: 0.34 mmol/L — ABNORMAL HIGH (ref 0.05–0.27)

## 2023-12-18 LAB — RESP PANEL BY RT-PCR (RSV, FLU A&B, COVID)  RVPGX2
Influenza A by PCR: NEGATIVE
Influenza B by PCR: NEGATIVE
Resp Syncytial Virus by PCR: NEGATIVE
SARS Coronavirus 2 by RT PCR: NEGATIVE

## 2023-12-18 LAB — BASIC METABOLIC PANEL WITH GFR
Anion gap: 16 — ABNORMAL HIGH (ref 5–15)
Anion gap: 10 (ref 5–15)
Anion gap: 8 (ref 5–15)
Anion gap: 10 (ref 5–15)
Anion gap: 9 (ref 5–15)
BUN: 29 mg/dL — ABNORMAL HIGH (ref 8–23)
BUN: 27 mg/dL — ABNORMAL HIGH (ref 8–23)
BUN: 26 mg/dL — ABNORMAL HIGH (ref 8–23)
BUN: 27 mg/dL — ABNORMAL HIGH (ref 8–23)
BUN: 22 mg/dL (ref 8–23)
CO2: 17 mmol/L — ABNORMAL LOW (ref 22–32)
CO2: 22 mmol/L (ref 22–32)
CO2: 21 mmol/L — ABNORMAL LOW (ref 22–32)
CO2: 21 mmol/L — ABNORMAL LOW (ref 22–32)
CO2: 22 mmol/L (ref 22–32)
Calcium: 8.5 mg/dL — ABNORMAL LOW (ref 8.9–10.3)
Calcium: 8 mg/dL — ABNORMAL LOW (ref 8.9–10.3)
Calcium: 7.9 mg/dL — ABNORMAL LOW (ref 8.9–10.3)
Calcium: 8.3 mg/dL — ABNORMAL LOW (ref 8.9–10.3)
Calcium: 8.3 mg/dL — ABNORMAL LOW (ref 8.9–10.3)
Chloride: 108 mmol/L (ref 98–111)
Chloride: 111 mmol/L (ref 98–111)
Chloride: 110 mmol/L (ref 98–111)
Chloride: 111 mmol/L (ref 98–111)
Chloride: 111 mmol/L (ref 98–111)
Creatinine, Ser: 1.74 mg/dL — ABNORMAL HIGH (ref 0.61–1.24)
Creatinine, Ser: 1.52 mg/dL — ABNORMAL HIGH (ref 0.61–1.24)
Creatinine, Ser: 1.41 mg/dL — ABNORMAL HIGH (ref 0.61–1.24)
Creatinine, Ser: 1.19 mg/dL (ref 0.61–1.24)
Creatinine, Ser: 1.2 mg/dL (ref 0.61–1.24)
GFR, Estimated: 39 mL/min — ABNORMAL LOW (ref >60)
GFR, Estimated: 46 mL/min — ABNORMAL LOW (ref >60)
GFR, Estimated: 50 mL/min — ABNORMAL LOW (ref >60)
GFR, Estimated: >60 mL/min (ref >60)
GFR, Estimated: >60 mL/min (ref >60)
Glucose, Bld: 231 mg/dL — ABNORMAL HIGH (ref 70–99)
Glucose, Bld: 96 mg/dL (ref 70–99)
Glucose, Bld: 173 mg/dL — ABNORMAL HIGH (ref 70–99)
Glucose, Bld: 158 mg/dL — ABNORMAL HIGH (ref 70–99)
Glucose, Bld: 129 mg/dL — ABNORMAL HIGH (ref 70–99)
Potassium: 3.9 mmol/L (ref 3.5–5.1)
Potassium: 3.7 mmol/L (ref 3.5–5.1)
Potassium: 3.6 mmol/L (ref 3.5–5.1)
Potassium: 3.7 mmol/L (ref 3.5–5.1)
Potassium: 3.8 mmol/L (ref 3.5–5.1)
Sodium: 141 mmol/L (ref 135–145)
Sodium: 143 mmol/L (ref 135–145)
Sodium: 139 mmol/L (ref 135–145)
Sodium: 142 mmol/L (ref 135–145)
Sodium: 142 mmol/L (ref 135–145)

## 2023-12-18 LAB — CBC WITH DIFFERENTIAL/PLATELET
Abs Immature Granulocytes: 0.05 10*3/uL (ref 0.00–0.07)
Basophils Absolute: 0 10*3/uL (ref 0.0–0.1)
Basophils Relative: 0 %
Eosinophils Absolute: 0 10*3/uL (ref 0.0–0.5)
Eosinophils Relative: 0 %
HCT: 42.5 % (ref 39.0–52.0)
Hemoglobin: 13.2 g/dL (ref 13.0–17.0)
Immature Granulocytes: 1 %
Lymphocytes Relative: 12 %
Lymphs Abs: 1.2 10*3/uL (ref 0.7–4.0)
MCH: 29.7 pg (ref 26.0–34.0)
MCHC: 31.1 g/dL (ref 30.0–36.0)
MCV: 95.7 fL (ref 80.0–100.0)
Monocytes Absolute: 0.9 10*3/uL (ref 0.1–1.0)
Monocytes Relative: 9 %
Neutro Abs: 8 10*3/uL — ABNORMAL HIGH (ref 1.7–7.7)
Neutrophils Relative %: 78 %
Platelets: 161 10*3/uL (ref 150–400)
RBC: 4.44 MIL/uL (ref 4.22–5.81)
RDW: 14.5 % (ref 11.5–15.5)
WBC: 10.2 10*3/uL (ref 4.0–10.5)
nRBC: 0 % (ref 0.0–0.2)

## 2023-12-18 LAB — URINALYSIS, W/ REFLEX TO CULTURE (INFECTION SUSPECTED)
Bilirubin Urine: NEGATIVE
Glucose, UA: >=500 mg/dL — AB
Ketones, ur: 5 mg/dL — AB
Nitrite: NEGATIVE
Protein, ur: 100 mg/dL — AB
Specific Gravity, Urine: 1.017 (ref 1.005–1.030)
WBC, UA: >50 WBC/hpf (ref 0–5)
pH: 7 (ref 5.0–8.0)

## 2023-12-18 LAB — COMPREHENSIVE METABOLIC PANEL WITH GFR
ALT: 17 U/L (ref 0–44)
AST: 35 U/L (ref 15–41)
Albumin: 3.8 g/dL (ref 3.5–5.0)
Alkaline Phosphatase: 62 U/L (ref 38–126)
Anion gap: 19 — ABNORMAL HIGH (ref 5–15)
BUN: 30 mg/dL — ABNORMAL HIGH (ref 8–23)
CO2: 16 mmol/L — ABNORMAL LOW (ref 22–32)
Calcium: 9.3 mg/dL (ref 8.9–10.3)
Chloride: 105 mmol/L (ref 98–111)
Creatinine, Ser: 1.64 mg/dL — ABNORMAL HIGH (ref 0.61–1.24)
GFR, Estimated: 42 mL/min — ABNORMAL LOW (ref >60)
Glucose, Bld: 318 mg/dL — ABNORMAL HIGH (ref 70–99)
Potassium: 4.2 mmol/L (ref 3.5–5.1)
Sodium: 140 mmol/L (ref 135–145)
Total Bilirubin: 0.6 mg/dL (ref 0.0–1.2)
Total Protein: 7.5 g/dL (ref 6.5–8.1)

## 2023-12-18 LAB — SODIUM, URINE, RANDOM: Sodium, Ur: 103 mmol/L

## 2023-12-18 LAB — LACTIC ACID, PLASMA
Lactic Acid, Venous: >9 mmol/L (ref 0.5–1.9)
Lactic Acid, Venous: 6.5 mmol/L (ref 0.5–1.9)
Lactic Acid, Venous: 2.7 mmol/L (ref 0.5–1.9)
Lactic Acid, Venous: 2.4 mmol/L (ref 0.5–1.9)

## 2023-12-18 LAB — CBG MONITORING, ED
Glucose-Capillary: 270 mg/dL — ABNORMAL HIGH (ref 70–99)
Glucose-Capillary: 215 mg/dL — ABNORMAL HIGH (ref 70–99)
Glucose-Capillary: 132 mg/dL — ABNORMAL HIGH (ref 70–99)
Glucose-Capillary: 116 mg/dL — ABNORMAL HIGH (ref 70–99)
Glucose-Capillary: 126 mg/dL — ABNORMAL HIGH (ref 70–99)

## 2023-12-18 LAB — TROPONIN I (HIGH SENSITIVITY)
Troponin I (High Sensitivity): 43 ng/L — ABNORMAL HIGH (ref <18)
Troponin I (High Sensitivity): 146 ng/L (ref <18)

## 2023-12-18 LAB — PROTIME-INR
INR: 1.4 — ABNORMAL HIGH (ref 0.8–1.2)
Prothrombin Time: 17.8 s — ABNORMAL HIGH (ref 11.4–15.2)

## 2023-12-18 LAB — GLUCOSE, CAPILLARY
Glucose-Capillary: 148 mg/dL — ABNORMAL HIGH (ref 70–99)
Glucose-Capillary: 116 mg/dL — ABNORMAL HIGH (ref 70–99)

## 2023-12-18 LAB — CREATININE, URINE, RANDOM: Creatinine, Urine: 74 mg/dL

## 2023-12-18 LAB — CK: Total CK: 142 U/L (ref 49–397)

## 2023-12-18 LAB — LIPASE, BLOOD: Lipase: 45 U/L (ref 11–51)

## 2023-12-18 LAB — HEMOGLOBIN A1C
Hgb A1c MFr Bld: 10.1 % — ABNORMAL HIGH (ref 4.8–5.6)
Mean Plasma Glucose: 243.17 mg/dL

## 2023-12-18 LAB — BLOOD GAS, VENOUS

## 2023-12-18 LAB — BRAIN NATRIURETIC PEPTIDE: B Natriuretic Peptide: 456.2 pg/mL — ABNORMAL HIGH (ref 0.0–100.0)

## 2023-12-18 MED ORDER — LACTATED RINGERS IV SOLN: INTRAVENOUS | Status: DC

## 2023-12-18 MED ORDER — SODIUM CHLORIDE 0.9 % IV BOLUS
1000.0000 mL | Freq: Once | INTRAVENOUS | Status: AC
  Administered 2023-12-18: 1000 mL via INTRAVENOUS

## 2023-12-18 MED ORDER — METOPROLOL SUCCINATE ER 25 MG PO TB24
12.5000 mg | ORAL_TABLET | Freq: Every day | ORAL | Status: DC
  Administered 2023-12-19 – 2023-12-23 (×5): 12.5 mg via ORAL
  Filled 2023-12-18 (×5): qty 1

## 2023-12-18 MED ORDER — APIXABAN 5 MG PO TABS
5.0000 mg | ORAL_TABLET | Freq: Two times a day (BID) | ORAL | Status: DC
  Administered 2023-12-18 – 2023-12-23 (×9): 5 mg via ORAL
  Filled 2023-12-18 (×10): qty 1

## 2023-12-18 MED ORDER — IOHEXOL 350 MG/ML SOLN
75.0000 mL | Freq: Once | INTRAVENOUS | Status: AC | PRN
  Administered 2023-12-18: 75 mL via INTRAVENOUS

## 2023-12-18 MED ORDER — VANCOMYCIN HCL IN DEXTROSE 1-5 GM/200ML-% IV SOLN
1000.0000 mg | Freq: Once | INTRAVENOUS | Status: AC
  Administered 2023-12-18: 1000 mg via INTRAVENOUS
  Filled 2023-12-18: qty 200

## 2023-12-18 MED ORDER — METRONIDAZOLE 500 MG/100ML IV SOLN
500.0000 mg | Freq: Once | INTRAVENOUS | Status: AC
  Administered 2023-12-18: 500 mg via INTRAVENOUS
  Filled 2023-12-18: qty 100

## 2023-12-18 MED ORDER — DONEPEZIL HCL 5 MG PO TABS
10.0000 mg | ORAL_TABLET | Freq: Every day | ORAL | Status: DC
  Administered 2023-12-19 – 2023-12-22 (×4): 10 mg via ORAL
  Filled 2023-12-18 (×4): qty 2

## 2023-12-18 MED ORDER — ATORVASTATIN CALCIUM 20 MG PO TABS
40.0000 mg | ORAL_TABLET | Freq: Every day | ORAL | Status: DC
  Administered 2023-12-19 – 2023-12-23 (×5): 40 mg via ORAL
  Filled 2023-12-18 (×5): qty 2

## 2023-12-18 MED ORDER — ENSURE ENLIVE PO LIQD
237.0000 mL | Freq: Two times a day (BID) | ORAL | Status: DC
  Administered 2023-12-19 – 2023-12-23 (×8): 237 mL via ORAL

## 2023-12-18 MED ORDER — METRONIDAZOLE 500 MG PO TABS
2000.0000 mg | ORAL_TABLET | Freq: Once | ORAL | Status: DC
  Filled 2023-12-18: qty 4

## 2023-12-18 MED ORDER — VANCOMYCIN HCL 1500 MG/300ML IV SOLN
1500.0000 mg | Freq: Once | INTRAVENOUS | Status: AC
  Administered 2023-12-18: 1500 mg via INTRAVENOUS
  Filled 2023-12-18: qty 300

## 2023-12-18 MED ORDER — HALOPERIDOL LACTATE 5 MG/ML IJ SOLN
5.0000 mg | Freq: Once | INTRAMUSCULAR | Status: AC
  Administered 2023-12-18: 5 mg via INTRAMUSCULAR
  Filled 2023-12-18: qty 1

## 2023-12-18 MED ORDER — CLOPIDOGREL BISULFATE 75 MG PO TABS
75.0000 mg | ORAL_TABLET | Freq: Every day | ORAL | Status: DC
  Administered 2023-12-19 – 2023-12-23 (×5): 75 mg via ORAL
  Filled 2023-12-18 (×6): qty 1

## 2023-12-18 MED ORDER — DEXTROSE 50 % IV SOLN: 0.0000 mL | INTRAVENOUS | Status: DC | PRN

## 2023-12-18 MED ORDER — ONDANSETRON HCL 4 MG PO TABS: 4.0000 mg | ORAL_TABLET | Freq: Four times a day (QID) | ORAL | Status: DC | PRN

## 2023-12-18 MED ORDER — SODIUM CHLORIDE 0.9 % IV SOLN: INTRAVENOUS | Status: AC

## 2023-12-18 MED ORDER — ONDANSETRON HCL 4 MG/2ML IJ SOLN
4.0000 mg | Freq: Four times a day (QID) | INTRAMUSCULAR | Status: DC | PRN
Start: 2023-12-18 — End: 2023-12-23

## 2023-12-18 MED ORDER — INSULIN ASPART 100 UNIT/ML IJ SOLN
0.0000 [IU] | Freq: Every day | INTRAMUSCULAR | Status: DC
Start: 2023-12-18 — End: 2023-12-23
  Administered 2023-12-19 – 2023-12-20 (×2): 2 [IU] via SUBCUTANEOUS
  Administered 2023-12-21 – 2023-12-22 (×2): 3 [IU] via SUBCUTANEOUS
  Filled 2023-12-18 (×4): qty 1

## 2023-12-18 MED ORDER — SODIUM CHLORIDE 0.9 % IV SOLN
1.0000 g | INTRAVENOUS | Status: AC
  Administered 2023-12-19 – 2023-12-20 (×2): 1 g via INTRAVENOUS
  Filled 2023-12-18 (×2): qty 10

## 2023-12-18 MED ORDER — INSULIN REGULAR(HUMAN) IN NACL 100-0.9 UT/100ML-% IV SOLN
INTRAVENOUS | Status: DC
  Administered 2023-12-18: 11 [IU]/h via INTRAVENOUS
  Filled 2023-12-18: qty 100

## 2023-12-18 MED ORDER — LORAZEPAM 2 MG/ML IJ SOLN
2.0000 mg | Freq: Once | INTRAMUSCULAR | Status: AC
  Administered 2023-12-18: 2 mg via INTRAVENOUS
  Filled 2023-12-18: qty 1

## 2023-12-18 MED ORDER — POTASSIUM CHLORIDE 10 MEQ/100ML IV SOLN
10.0000 meq | INTRAVENOUS | Status: AC
  Administered 2023-12-18 (×2): 10 meq via INTRAVENOUS
  Filled 2023-12-18 (×2): qty 100

## 2023-12-18 MED ORDER — SODIUM CHLORIDE 0.9 % IV SOLN
2.0000 g | Freq: Once | INTRAVENOUS | Status: AC
  Administered 2023-12-18: 2 g via INTRAVENOUS
  Filled 2023-12-18: qty 12.5

## 2023-12-18 MED ORDER — DEXTROSE IN LACTATED RINGERS 5 % IV SOLN: INTRAVENOUS | Status: DC

## 2023-12-18 MED ORDER — INSULIN ASPART 100 UNIT/ML IJ SOLN
0.0000 [IU] | Freq: Three times a day (TID) | INTRAMUSCULAR | Status: DC
  Administered 2023-12-18 – 2023-12-19 (×2): 1 [IU] via SUBCUTANEOUS
  Administered 2023-12-19 (×2): 3 [IU] via SUBCUTANEOUS
  Administered 2023-12-20: 5 [IU] via SUBCUTANEOUS
  Administered 2023-12-20: 3 [IU] via SUBCUTANEOUS
  Administered 2023-12-20: 5 [IU] via SUBCUTANEOUS
  Administered 2023-12-21: 3 [IU] via SUBCUTANEOUS
  Administered 2023-12-21: 5 [IU] via SUBCUTANEOUS
  Administered 2023-12-21: 2 [IU] via SUBCUTANEOUS
  Administered 2023-12-22: 1 [IU] via SUBCUTANEOUS
  Administered 2023-12-22: 5 [IU] via SUBCUTANEOUS
  Administered 2023-12-22: 7 [IU] via SUBCUTANEOUS
  Administered 2023-12-23 (×2): 5 [IU] via SUBCUTANEOUS
  Filled 2023-12-18 (×15): qty 1

## 2023-12-18 MED ORDER — SODIUM CHLORIDE 0.9 % IV SOLN: INTRAVENOUS | Status: DC

## 2023-12-18 NOTE — H&P (Addendum)
 History and Physical    Luke Chen VWU:[id number] DOB: [birth year] DOA: 12/18/2023  PCP: Wilnette Haste., Theron Flavin, MD (Confirm with patient/family/NH records and if not entered, this has to be entered at [facility] point of entry) Patient coming from: Home  I have personally briefly reviewed patient's old medical records in [facility]  Chief Complaint: Feeling weak and fell  HPI: Luke Chen is a 83 y.o. 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  As time as outpatient, he came back from MRI and he was sedated and unable to provide any history, all history provided by wife at bedside.  Wife reported that last week patient complained about burning sensation when urinate and wife called PCPs office but with no answers.  Over the weekend patient appeared to be improving however yesterday evening, patient went to bathroom and called wife that he could not get up from the toilet himself.  Wife went to see him and found that his legs were weak and unable to hold him up.  Then wife called EMS.  Wife also reported that patient has a history of kidney stones but she thought whether patient has passed any stone last week.  According to wife patient has no diarrhea or any complaining about pain.  Patient has dementia and on Aricept  however has been able to do most of the daily activities such as bathing himself and shave himself and things himself independently.  No fall. ED Course: Afebrile, tachycardia, blood pressure 105/57 on arrival O2 saturation 100% on room air.  UA showed 3+ WBC, 2+ RBC, CT abdomen pelvis showed mild left-sided hydronephrosis and hydroureter but no obstructive stone was identified, blood work showed WBC 10.2 glucose 318, BUN 30 creatinine 1.6 bicarb 16.  Patient was given a  total 1000 mL IV bolus and started on cefepime and vancomycin.  Review of Systems: Unable to perform, patient is sedated.  Past Medical History:  Diagnosis Date   Dementia (HCC)    Diabetes mellitus without complication (HCC)    Hypertension     History reviewed. No pertinent surgical history.   reports that he has never smoked. He has never used smokeless tobacco. He reports that he does not drink alcohol and does not use drugs.  Allergies  Allergen Reactions   Gramineae Pollens Other (See Comments)    12/14/21 Patient's wife states "don't know if it's his medications or allergies but he gets a runny nose", Annette Killings MD made aware   Other Other (See Comments)    12/14/21 Patient's wife states "don't know if it's his medications or allergies but he gets a runny nose", Annette Killings MD made aware    History reviewed. No pertinent family history.   Prior to Admission medications   Medication Sig Start Date End Date Taking? Authorizing Provider  ascorbic acid (VITAMIN C) 1000 MG tablet Take 1,000 mg by mouth daily at 2 PM.   Yes [provider]  atorvastatin  (LIPITOR) 40 MG tablet Take 40 mg by mouth daily.   Yes [provider]  clopidogrel (PLAVIX) 75 MG tablet Take 75 mg by mouth daily. 11/20/21  Yes [provider]  cyanocobalamin (VITAMIN B12) 1000 MCG tablet Take 1,000 mcg by mouth once a week.   Yes [provider]  donepezil  (ARICEPT ) 10 MG tablet  TAKE 1 TABLET BY MOUTH EVERY DAY 06/14/22  Yes Masoud, Pandora Bogaert, MD  ELIQUIS 5 MG TABS tablet Take 5 mg by mouth 2 (two) times daily. 12/26/21  Yes [provider]  glimepiride  (AMARYL ) 2 MG tablet Take 1 tablet (2 mg total) by mouth daily at 6 (six) AM. 02/01/22  Yes Masoud, Pandora Bogaert, MD  lisinopril  (ZESTRIL ) 5 MG tablet Take 1 tablet (5 mg total) by mouth daily. 10/15/20  Yes Masoud, Pandora Bogaert, MD  metFORMIN  (GLUCOPHAGE ) 1000 MG tablet TAKE 1 TABLET BY MOUTH TWICE A DAY 06/08/22  Yes Masoud, Javed, MD   metoprolol succinate (TOPROL-XL) 25 MG 24 hr tablet Take 0.5 tablets by mouth daily. 12/11/22  Yes [provider]  aspirin  EC 81 MG EC tablet Take 1 tablet (81 mg total) by mouth daily. Patient not taking: Reported on 12/18/2023 02/07/19   Patel, Sona, MD  donepezil  (ARICEPT ) 10 MG tablet Take 10 mg by mouth daily at 2 PM. 10/28/18   [provider]  lovastatin  (MEVACOR ) 20 MG tablet Take 1 tablet (20 mg total) by mouth every evening. Patient not taking: Reported on 12/18/2023 05/17/20   Masoud, Javed, MD  spironolactone (ALDACTONE) 25 MG tablet Take 25 mg by mouth 2 (two) times daily. Patient not taking: Reported on 12/18/2023    [provider]    Physical Exam: Vitals:   12/18/23 0745 12/18/23 0800 12/18/23 0930 12/18/23 1013  BP: 97/63 118/79 133/69   Pulse: (!) 106 (!) 113 (!) 103   Resp: 16 12 16    Temp:    98 F (36.7 C)  TempSrc:    Axillary  SpO2: 100% 100% 100%   Weight:      Height:        Constitutional: NAD, calm, comfortable Vitals:   12/18/23 0745 12/18/23 0800 12/18/23 0930 12/18/23 1013  BP: 97/63 118/79 133/69   Pulse: (!) 106 (!) 113 (!) 103   Resp: 16 12 16    Temp:    98 F (36.7 C)  TempSrc:    Axillary  SpO2: 100% 100% 100%   Weight:      Height:       Eyes: PERRL, lids and conjunctivae normal ENMT: Mucous membranes are moist. Posterior pharynx clear of any exudate or lesions.Normal dentition.  Neck: normal, supple, no masses, no thyromegaly Respiratory: clear to auscultation bilaterally, no wheezing, no crackles. Normal respiratory effort. No accessory muscle use.  Cardiovascular: Regular rate and rhythm, no murmurs / rubs / gallops. No extremity edema. 2+ pedal pulses. No carotid bruits.  Abdomen: no tenderness, no masses palpated. No hepatosplenomegaly. Bowel sounds positive.  Musculoskeletal: no clubbing / cyanosis. No joint deformity upper and lower extremities. Good ROM, no contractures. Normal muscle tone.  Skin: no rashes,  lesions, ulcers. No induration Neurologic: No facial droops, moving all limbs Psychiatric: Arousable, sedated   Labs on Admission: I have personally reviewed following labs and imaging studies  CBC: Recent Labs  Lab 12/18/23 0424  WBC 10.2  NEUTROABS 8.0*  HGB 13.2  HCT 42.5  MCV 95.7  PLT 161   Basic Metabolic Panel: Recent Labs  Lab 12/18/23 0424 12/18/23 0700 12/18/23 0908  NA 140 141 143  K 4.2 3.9 3.7  CL 105 108 111  CO2 16* 17* 22  GLUCOSE 318* 231* 96  BUN 30* 29* 27*  CREATININE 1.64* 1.74* 1.52*  CALCIUM  9.3 8.5* 8.0*   GFR: Estimated Creatinine Clearance: 45.2 mL/min (A) (by C-G formula based on SCr of 1.52 mg/dL (H)).  Liver Function Tests: Recent Labs  Lab 12/18/23 0424  AST 35  ALT 17  ALKPHOS 62  BILITOT 0.6  PROT 7.5  ALBUMIN 3.8   Recent Labs  Lab 12/18/23 0424  LIPASE 45   No results for input(s): "AMMONIA" in the last 168 hours. Coagulation Profile: Recent Labs  Lab 12/18/23 0424  INR 1.4*   Cardiac Enzymes: Recent Labs  Lab 12/18/23 0424  CKTOTAL 142   BNP (last 3 results) No results for input(s): "PROBNP" in the last 8760 hours. HbA1C: No results for input(s): "HGBA1C" in the last 72 hours. CBG: Recent Labs  Lab 12/18/23 0605 12/18/23 0709 12/18/23 0824 12/18/23 0955 12/18/23 1143  GLUCAP 270* 215* 132* 116* 126*   Lipid Profile: No results for input(s): "CHOL", "HDL", "LDLCALC", "TRIG", "CHOLHDL", "LDLDIRECT" in the last 72 hours. Thyroid  Function Tests: No results for input(s): "TSH", "T4TOTAL", "FREET4", "T3FREE", "THYROIDAB" in the last 72 hours. Anemia Panel: No results for input(s): "VITAMINB12", "FOLATE", "FERRITIN", "TIBC", "IRON", "RETICCTPCT" in the last 72 hours. Urine analysis:    Component Value Date/Time   COLORURINE YELLOW (A) 12/18/2023 0645   APPEARANCEUR TURBID (A) 12/18/2023 0645   LABSPEC 1.017 12/18/2023 0645   PHURINE 7.0 12/18/2023 0645   GLUCOSEU >=500 (A) 12/18/2023 0645   HGBUR  SMALL (A) 12/18/2023 0645   BILIRUBINUR NEGATIVE 12/18/2023 0645   BILIRUBINUR neg 01/02/2022 1407   KETONESUR 5 (A) 12/18/2023 0645   PROTEINUR 100 (A) 12/18/2023 0645   UROBILINOGEN negative (A) 01/02/2022 1407   NITRITE NEGATIVE 12/18/2023 0645   LEUKOCYTESUR LARGE (A) 12/18/2023 0645    Radiological Exams on Admission: MR BRAIN WO CONTRAST Result Date: 12/18/2023 CLINICAL DATA:  Neuro deficit, acute, stroke suspected. Dementia. Altered mental status. Unable to walk. Generalized weakness. EXAM: MRI HEAD WITHOUT CONTRAST TECHNIQUE: Multiplanar, multiecho pulse sequences of the brain and surrounding structures were obtained without intravenous contrast. COMPARISON:  Head CT earlier same day FINDINGS: Brain: Diffusion imaging does not show any acute or subacute infarction or other cause of restricted diffusion. Small-vessel ischemic changes are seen affecting the pons. Few old small vessel cerebellar infarctions. Cerebral hemispheres show generalized atrophy. There are old bilateral occipital cortical and subcortical infarctions. Old bilateral parietal cortical and subcortical infarctions, larger on the left than the right. Chronic small-vessel ischemic changes of the left thalamus and cerebral hemispheric white matter. No mass, recent hemorrhage, hydrocephalus or extra-axial collection. Hemosiderin deposition in the region of the old occipital strokes. Vascular: Major vessels at the base of the brain show flow. Skull and upper cervical spine: Negative Sinuses/Orbits: Clear/normal Other: None IMPRESSION: No acute finding. Generalized atrophy. Chronic small-vessel ischemic changes of the pons, left thalamus and cerebral hemispheric white matter. Old bilateral occipital and parietal cortical and subcortical infarctions. Electronically Signed   By: Bettylou Brunner M.D.   On: 12/18/2023 11:29   CT CHEST ABDOMEN PELVIS W CONTRAST Result Date: 12/18/2023 CLINICAL DATA:  One day history of generalized weakness  and refusal to walk EXAM: CT CHEST, ABDOMEN, AND PELVIS WITH CONTRAST TECHNIQUE: Multidetector CT imaging of the chest, abdomen and pelvis was performed following the standard protocol during bolus administration of intravenous contrast. RADIATION DOSE REDUCTION: This exam was performed according to the departmental dose-optimization program which includes automated exposure control, adjustment of the mA and/or kV according to patient size and/or use of iterative reconstruction technique. CONTRAST:  75mL OMNIPAQUE IOHEXOL 350 MG/ML SOLN COMPARISON:  Same day chest radiograph, CT abdomen and pelvis dated 05/16/2017, CT chest dated 10/31/2014 FINDINGS: CT  CHEST FINDINGS Cardiovascular: Normal heart size. No significant pericardial fluid/thickening. Great vessels are normal in course and caliber. No central pulmonary emboli. Coronary artery calcifications. Mediastinum/Nodes: Imaged thyroid  gland without nodules meeting criteria for imaging follow-up by size. Normal esophagus. No pathologically enlarged axillary, supraclavicular, mediastinal, or hilar lymph nodes. Lungs/Pleura: The central airways are patent. Small volume secretions within the lower trachea. Left greater than right mild peribronchial wall thickening, most notable in the left lower lobe. Dependent ground-glass opacity within the left upper lobe. Bilateral lower lobe subpleural ground-glass opacity, likely atelectasis. No pneumothorax. No pleural effusion. Musculoskeletal: No acute or abnormal lytic or blastic osseous lesions. Multilevel degenerative changes of the thoracic spine. CT ABDOMEN PELVIS FINDINGS Hepatobiliary: No focal hepatic lesions. No intra or extrahepatic biliary ductal dilation. Normal gallbladder. Pancreas: No focal lesions or main ductal dilation. Spleen: Normal in size without focal abnormality. Adrenals/Urinary Tract: No adrenal nodules. Right renal cortical scarring. No suspicious renal masses. Mild left hydroureteronephrosis to the  level of the ureterovesical junction without definite obstructing stone. No right hydronephrosis. Punctate nonobstructing bilateral renal stones. Underdistended urinary bladder with irregular circumferential mural thickening, midline superior diverticulum, and mild pericystic stranding. Bladder contains a large oval bladder stone. Stomach/Bowel: Normal appearance of the stomach. No evidence of bowel wall thickening, distention, or inflammatory changes. Colonic diverticulosis without acute diverticulitis. Appendix is not discretely seen. Vascular/Lymphatic: Aortic atherosclerosis. No enlarged abdominal or pelvic lymph nodes. Reproductive: Mild enlargement of the prostate gland. Other: No free fluid, fluid collection, or free air. Musculoskeletal: No acute or abnormal lytic or blastic osseous findings. Multilevel degenerative changes of the lumbar spine. Small fat-containing left inguinal hernia. 1.6 cm mildly thick-walled subcutaneous cystic focus within the right posterior paramidline region overlying the level of T12-L1 (3:57). IMPRESSION: 1. Mild left hydroureteronephrosis to the level of the ureterovesical junction without definite obstructing stone, which may reflect ascending urinary tract infection. 2. Underdistended urinary bladder with irregular circumferential mural thickening, midline superior diverticulum, and mild pericystic stranding, which may represent cystitis on a background of chronic outlet obstruction. 3. Left greater than right mild peribronchial wall thickening, most notable in the left lower lobe, which may be seen in the setting of bronchitis. 4. Dependent ground-glass opacity within the left upper lobe, which may represent aspiration. 5. A 1.6 cm subcutaneous cystic focus within the right posterior paramidline region overlying the level of T12-L1 may represent an epidermal inclusion cyst. 6. Aortic Atherosclerosis (ICD10-I70.0). Coronary artery calcifications. Assessment for potential risk  factor modification, dietary therapy or pharmacologic therapy may be warranted, if clinically indicated. Electronically Signed   By: Limin  Xu M.D.   On: 12/18/2023 09:56   CT Angio Head Neck W WO CM Result Date: 12/18/2023 CLINICAL DATA:  Stroke suspected EXAM: CT ANGIOGRAPHY HEAD AND NECK WITH AND WITHOUT CONTRAST TECHNIQUE: Multidetector CT imaging of the head and neck was performed using the standard protocol during bolus administration of intravenous contrast. Multiplanar CT image reconstructions and MIPs were obtained to evaluate the vascular anatomy. Carotid stenosis measurements (when applicable) are obtained utilizing NASCET criteria, using the distal internal carotid diameter as the denominator. RADIATION DOSE REDUCTION: This exam was performed according to the departmental dose-optimization program which includes automated exposure control, adjustment of the mA and/or kV according to patient size and/or use of iterative reconstruction technique. CONTRAST:  75mL OMNIPAQUE IOHEXOL 350 MG/ML SOLN COMPARISON:  Head CT from earlier today FINDINGS: CT HEAD FINDINGS Brain: Patchy chronic infarcts along the bilateral occipital parietal and left parietal cortex. No acute hemorrhage,  hydrocephalus, mass, or collection. Vascular: No hyperdense vessel Skull: Normal. Negative for fracture or focal lesion. Sinuses/Orbits: No acute finding. Review of the MIP images confirms the above findings CTA NECK FINDINGS Aortic arch: Extensive atheromatous plaque where partially covered. Notable plaque at the great vessel ostia. Right carotid system: Mixed density plaque at the brachiocephalic origin causes 70% stenosis. Mild atheromatous plaque in the carotid circulation without flow reducing stenosis or ulceration. Left carotid system: Mild generalized atheromatous wall thickening of the common and proximal internal carotid arteries. No focal or flow reducing stenosis. Vertebral arteries: No flow reducing stenosis in the  subclavian arteries. Atheromatous irregularity of the vertebral arteries without visible flow reducing stenosis. The right vertebral origin is obscured by streak artifact from intravenous contrast. Skeleton: Areas of sclerosis primarily along degenerative spurring and at the edentulous mandible. No focal or aggressive lesion seen. Other neck: No acute finding Upper chest: No acute finding Review of the MIP images confirms the above findings CTA HEAD FINDINGS Anterior circulation: Atheromatous calcification of the carotid siphons with approximally 50% left paraclinoid ICA narrowing. No major branch occlusion, beading, or aneurysm Posterior circulation: The right vertebral artery ends in the right PICA. Robust flow in the left vertebral and basilar arteries. Generalized atheromatous irregularity and attenuation of the posterior cerebral arteries without reversible proximal stenosis or major branch occlusion. Venous sinuses: Unremarkable for arterial timing Anatomic variants: None significant Review of the MIP images confirms the above findings IMPRESSION: No emergent arterial finding. Atherosclerosis in the neck, most notably 70% atheromatous narrowing at the brachiocephalic origin. Extensive intracranial atherosclerosis with 50% left paraclinoid ICA narrowing Electronically Signed   By: Ronnette Coke M.D.   On: 12/18/2023 09:23   DG Chest Port 1 View Result Date: 12/18/2023 CLINICAL DATA:  Chest pain EXAM: PORTABLE CHEST 1 VIEW COMPARISON:  10/31/2014 FINDINGS: Normal heart size and mediastinal contours.  Coronary stenting. There is no edema, consolidation, effusion, or pneumothorax. Bilateral skin folds. Artifact from EKG leads. IMPRESSION: No evidence of acute disease. Electronically Signed   By: Ronnette Coke M.D.   On: 12/18/2023 05:15   CT Head Wo Contrast Result Date: 12/18/2023 CLINICAL DATA:  Mental status change with unknown cause EXAM: CT HEAD WITHOUT CONTRAST TECHNIQUE: Contiguous axial images were  obtained from the base of the skull through the vertex without intravenous contrast. RADIATION DOSE REDUCTION: This exam was performed according to the departmental dose-optimization program which includes automated exposure control, adjustment of the mA and/or kV according to patient size and/or use of iterative reconstruction technique. COMPARISON:  02/05/2019 FINDINGS: Brain: Moderate area of low-density involving left parietal cortex with areas approaching CSF density and volume loss, chronic findings. Even more chronic and low-density appearing infarcts in the bilateral occipital parietal cortex. Generalized brain atrophy that has progressed from 2020. Small chronic infarct in the right cerebellum and likely at the vermis. No acute hemorrhage, hydrocephalus, or mass. Vascular: No hyperdense vessel or unexpected calcification. Skull: Normal. Negative for fracture or focal lesion. Sinuses/Orbits: No acute finding. IMPRESSION: No acute finding. Chronic bilateral occipital parietal infarcts since 2020. Generalized brain atrophy that is progressed from 2020. Electronically Signed   By: Ronnette Coke M.D.   On: 12/18/2023 05:04    EKG: Independently reviewed.  Sinus, chronic LBBB  Assessment/Plan Principal Problem:   AKI (acute kidney injury) (HCC) Active Problems:   AMS (altered mental status)   Impaired ambulation   UTI (urinary tract infection)  (please populate well all problems here in Problem List. (For example, if  patient is on BP meds at home and you resume or decide to hold them, it is a problem that needs to be her. Same for CAD, COPD, HLD and so on)  Sepsis with acute endorgan damage - Sepsis evidenced by tachycardia, elevated lactic acid, source infection is a complicated UTI with possibly a past left-sided ureter/kidney stone - Signs of acute endorgan damage of AKI as well as possible acute metabolic encephalopathy - Continue gentle hydration given there is a history of low LVEF CHF -  De-escalate antibiotics to ceftriaxone   Acute metabolic encephalopathy - MRI negative for stroke - Treat UTI then reevaluate  AKI with acute non-anion gap metabolic acidosis - Probably secondary to prerenal etiology and sepsis, hydration then reevaluate - Send FeNa study - Other DDx, post renal obstruction less likely although there is a signs of left-sided hydroureter/hydronephrosis.  I propose to treat UTI, if there is no significant improvement of kidney function and/or sepsis, we will repeat kidney imaging such as ultrasound.  Wife agreed with the plan.  Mild DKA Versus starvation ketoacidosis - IV hydration, was briefly on insulin drip in the ED. - DC insulin drip and switch to SSI  History of chronic HFrEF- -on IV hydration for sepsis fluid resuscitation for 8 more hours - Monitor volume status, recheck chest x-ray tomorrow  Hx of LV thrombosis - No acute concern, continue Eliquis  Deconditioning - Likely secondary to sepsis, UTI, AKI and acidosis, management as above - PT evaluation  DVT prophylaxis: Eliquis Code Status: Full code Family Communication: Wife at bedside Disposition Plan: Patient is sick with sepsis, encephalopathy and AKI requiring IV fluid resuscitation and IV antibiotics, expect more than 2 midnight hospital stay Consults called: None Admission status: Telemetry observation   [place] MD [facility] Pager [phone]  12/18/2023, 11:57 AM

## 2023-12-18 NOTE — Plan of Care (Signed)
  Problem: Coping: Goal: Ability to adjust to condition or change in health will improve Outcome: Progressing   Problem: Fluid Volume: Goal: Ability to maintain a balanced intake and output will improve Outcome: Progressing   Problem: Metabolic: Goal: Ability to maintain appropriate glucose levels will improve Outcome: Progressing   Problem: Nutritional: Goal: Maintenance of adequate nutrition will improve Outcome: Progressing Goal: Progress toward achieving an optimal weight will improve Outcome: Progressing   Problem: Skin Integrity: Goal: Risk for impaired skin integrity will decrease Outcome: Progressing   Problem: Tissue Perfusion: Goal: Adequacy of tissue perfusion will improve Outcome: Progressing   Problem: Clinical Measurements: Goal: Ability to maintain clinical measurements within normal limits will improve Outcome: Progressing Goal: Will remain free from infection Outcome: Progressing Goal: Diagnostic test results will improve Outcome: Progressing Goal: Respiratory complications will improve Outcome: Progressing Goal: Cardiovascular complication will be avoided Outcome: Progressing   Problem: Nutrition: Goal: Adequate nutrition will be maintained Outcome: Progressing   Problem: Coping: Goal: Level of anxiety will decrease Outcome: Progressing   Problem: Elimination: Goal: Will not experience complications related to bowel motility Outcome: Progressing Goal: Will not experience complications related to urinary retention Outcome: Progressing   Problem: Pain Managment: Goal: General experience of comfort will improve and/or be controlled Outcome: Progressing   Problem: Safety: Goal: Ability to remain free from injury will improve Outcome: Progressing   Problem: Skin Integrity: Goal: Risk for impaired skin integrity will decrease Outcome: Progressing

## 2023-12-18 NOTE — ED Provider Notes (Signed)
 [facility] Provider Note    Event Date/Time   First MD Initiated Contact with Patient 12/18/23 0411     (approximate)   History   Weakness   HPI  Luke Chen is a 83 y.o. 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  Most of the collateral information is given by his wife who is here at bedside given the patient's advanced dementia.  She states that they were together earlier today and he was walking fine at the supermarket and around 4 PM when he got out of his car and went home.  When he tried to walk around 8 PM she noticed that he seemed unsteady on his feet, weaker than usual, and then refused to walk altogether.  He is not complaining of any focal pain, she does not report any trauma or falls.  Prior to this evening she has had subacute worsening of his general health status longstanding but no acute illnesses noted by wife.  His p.o. intake has been poor over the last several days.  Independent Historian contributed to assessment above: Wife gives collateral as above  External Medical Documents Reviewed: Cardiology notes from April 2025 documenting his cardiac history, at that time noted history of kidney stones and recurrent UTIs as well.      Physical Exam   Triage Vital Signs: ED Triage Vitals  Encounter Vitals Group     BP      Systolic BP Percentile      Diastolic BP Percentile      Pulse      Resp      Temp      Temp src      SpO2      Weight      Height      Head Circumference      Peak Flow      Pain Score      Pain Loc      Pain Education      Exclude from Growth Chart     Most recent vital signs: Vitals:   12/18/23 0515 12/18/23 0530  BP: 110/64 (!) 103/57  Pulse:  86 (!) 113  Resp: 17 16  Temp:    SpO2: 100% 100%    General: Awake, no distress.  CV:  Good peripheral perfusion.  Resp:  Normal effort.  Abd:  No distention.  Other:  Who is chronically ill-appearing, cachectic, frail, dehydrated with dry mucous membranes and poor skin turgor.  Initial blood pressure was 100s over 60s but then dropped to 80s over 50s on recheck.  He is tachycardic.  His lung sounds are clear without focality or wheezing and his abdomen is soft and nontender.  With a history of dementia he is unable to follow my commands and it takes a lot of coaxing for him to start moving his upper extremities and lower extremities, there is generalized weakness throughout and perhaps the slightest asymmetric weakness right lower extremity greater than left.   ED Results / Procedures / Treatments   Labs (all labs ordered are listed, but only abnormal results are displayed) Labs Reviewed  COMPREHENSIVE METABOLIC PANEL WITH GFR - Abnormal; Notable for the following components:      Result Value   CO2 16 (*)    Glucose,  Bld 318 (*)    BUN 30 (*)    Creatinine, Ser 1.64 (*)    GFR, Estimated 42 (*)    Anion gap 19 (*)    All other components within normal limits  CBC WITH DIFFERENTIAL/PLATELET - Abnormal; Notable for the following components:   Neutro Abs 8.0 (*)    All other components within normal limits  CBG MONITORING, ED - Abnormal; Notable for the following components:   Glucose-Capillary 270 (*)    All other components within normal limits  RESP PANEL BY RT-PCR (RSV, FLU A&B, COVID)  RVPGX2  CULTURE, BLOOD (ROUTINE X 2)  CULTURE, BLOOD (ROUTINE X 2)  LIPASE, BLOOD  CK  BRAIN NATRIURETIC PEPTIDE  URINALYSIS, W/ REFLEX TO CULTURE (INFECTION SUSPECTED)  LACTIC ACID, PLASMA  LACTIC ACID, PLASMA  PROTIME-INR  BLOOD GAS, VENOUS  BETA-HYDROXYBUTYRIC ACID  BASIC METABOLIC PANEL WITH GFR  BASIC METABOLIC PANEL WITH GFR  BASIC METABOLIC PANEL WITH GFR  BASIC METABOLIC  PANEL WITH GFR  BASIC METABOLIC PANEL WITH GFR  BETA-HYDROXYBUTYRIC ACID  BETA-HYDROXYBUTYRIC ACID  BETA-HYDROXYBUTYRIC ACID  BETA-HYDROXYBUTYRIC ACID  BETA-HYDROXYBUTYRIC ACID  BLOOD GAS, VENOUS  TROPONIN I (HIGH SENSITIVITY)  TROPONIN I (HIGH SENSITIVITY)     I ordered and reviewed the above labs they are notable for is a blood sugar of 318 with anion gap of 19 and bicarb of 16.  EKG  ED ECG REPORT I, Buell Carmin, the attending physician, personally viewed and interpreted this ECG.   Date: 12/18/2023  EKG Time: 0419  Rate: 96  Rhythm: sinus  Axis: nl  Intervals:lbbb  ST&T Change: no stemi    RADIOLOGY I independently reviewed and interpreted CT of the head and see no obvious bleeding or midline shift I also reviewed radiologist's formal read.   PROCEDURES:  Critical Care performed: Yes, see critical care procedure note(s)  .Critical Care  Performed by: Buell Carmin, MD Authorized by: Buell Carmin, MD   Critical care provider statement:    Critical care time (minutes):  45   Critical care was time spent personally by me on the following activities:  Development of treatment plan with patient or surrogate, discussions with consultants, evaluation of patient's response to treatment, examination of patient, ordering and review of laboratory studies, ordering and review of radiographic studies, ordering and performing treatments and interventions, pulse oximetry, re-evaluation of patient's condition and review of old charts    MEDICATIONS ORDERED IN ED: Medications  insulin regular, human (MYXREDLIN) 100 units/ 100 mL infusion (11 Units/hr Intravenous New Bag/Given 12/18/23 0611)  lactated ringers infusion (has no administration in time range)  dextrose 5 % in lactated ringers infusion (has no administration in time range)  dextrose 50 % solution 0-50 mL (has no administration in time range)  potassium chloride  10 mEq in 100 mL IVPB (10 mEq Intravenous New Bag/Given  12/18/23 0550)  iohexol (OMNIPAQUE) 350 MG/ML injection 75 mL (has no administration in time range)  vancomycin (VANCOCIN) IVPB 1000 mg/200 mL premix (1,000 mg Intravenous New Bag/Given 12/18/23 0627)    Followed by  vancomycin (VANCOREADY) IVPB 1500 mg/300 mL (has no administration in time range)  metroNIDAZOLE (FLAGYL) IVPB 500 mg (500 mg Intravenous New Bag/Given 12/18/23 0627)  sodium chloride  0.9 % bolus 1,000 mL (0 mLs Intravenous Stopped 12/18/23 0544)  sodium chloride  0.9 % bolus 1,000 mL (1,000 mLs Intravenous New Bag/Given 12/18/23 0551)  ceFEPIme (MAXIPIME) 2 g in sodium chloride  0.9 % 100 mL IVPB (0 g Intravenous Stopped 12/18/23 0629)  IMPRESSION / MDM / ASSESSMENT AND PLAN / ED COURSE  I reviewed the triage vital signs and the nursing notes.                                Patient's presentation is most consistent with acute presentation with potential threat to life or bodily function.  Differential diagnosis includes, but is not limited to, sepsis, stroke, ACS, dehydration or electrolyte derangement, DKA   The patient is on the cardiac monitor to evaluate for evidence of arrhythmia and/or significant heart rate changes.  MDM:    He is good quite a bit going on in his clouded by his advanced dementia and that he cannot give us  much information so I am relying on his wife and his clinical exam vital signs...   I am concerned that he has sepsis given his vital sign abnormalities with tachycardia and hypotension and his propensity for developing urinary tract infections.  Getting sepsis labs including lactic, blood cultures, urinalysis, chest x-ray and treating with empiric antibiotics, sepsis fluid bolus.  I am just going to pan scan him because I do not have an obvious source care chest abdomen pelvis CT.  His labs suggest that he is in mild DKA so we will start treatment protocol with Endo tool for this.  I wonder if he had a stroke as well but his last known normal was 4 PM  which is well outside of the thrombolytic window so I did not call stroke code.  It is tough to ascertain because he cannot follow my directions.  He might have the slightest right lower extremity weakness compared to the left but he is globally weak throughout.  He is refusing to walk although this might be due to a number of issues including his suspected sepsis or DKA as above as well.  He is definitely high risk because of his history of thromboembolisms and LV thrombus although he is medicated with Eliquis and wife states that he is compliant with his medication as she helps him with that.  Will get workup as above, and also tack on a CT angiogram of the head and MRI of the brain  Pending workup as above, will require admission.     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    Buell Carmin, MD 12/18/23 [phone]

## 2023-12-18 NOTE — Progress Notes (Addendum)
 12/18/23 1355  Charting Type  Charting Type Admission/Transfer Assessment  Primrose Work Intensity Score (Update with each assessment and as needed)  Work Intensity Score (Level) 3  Level 3 Intensity F.Total dependent care with all ADL's & mobility  Neurological  Neuro (WDL) X  Orientation Level Other (comment) (unable to assess at this time)  Cognition Unable to follow commands  Speech Other (Comment) (UTA at this time)  Glasgow Coma Scale  Eye Opening 2  Best Verbal Response (NON-intubated) 2  Best Motor Response 4  Glasgow Coma Scale Score 8  NuDESC - Delirium Risk Factor Assessment (Complete for non-ICU patients)  Delirium Risk Factor Assessment Age greater than or equal to 65 years  NuDESC - Nursing Delirium Screening Scale (Complete for non-ICU patients)  Disorientation 2  Inappropriate Behavior 0  Inappropriate Communications 0  Illusions/hallucinations 0  Psychomotor Retardation 2  NuDESC Total Score 4  NuDESC - Delirium Prevention:  Universal Requirements (Complete for all non-ICU patients with a delirium risk factor)  Universal Precautions Initiated *See Row Information* Yes  NuDESC - Interventions for patient with NuDESC equal to or greater than 2 (Complete for non-ICU patients)  NuDESC Positive:  Interventions Continue Universal (preventative) measures;Utilize bed alarms  Oral Assessment (Complete on admission/transfer/every shift)  Oral Assessment  (WDL) X  Lips Symmetrical  Teeth Missing (Comment);Poor dental hygiene  Tongue Pink;Moist  Mucous Membrane(s) Pink;Moist  Saliva Moist, saliva free flowing  Is patient on any of following O2 devices? None of the above  Nutritional status No high risk factors  Oral Assessment Risk  High Risk  HEENT  HEENT (WDL) X  Voice Other (Comment) (UTA, patient not alert)  Respiratory  Respiratory Pattern Regular;Unlabored  Chest Assessment Chest expansion symmetrical  Bilateral Breath Sounds Clear  Respiratory (WDL)  X  Cardiac  Cardiac (WDL) X  Pulse Regular  Heart Sounds S1, S2  Jugular Venous Distention (JVD) No  ECG Monitor Yes  Vascular  Vascular (WDL) X  Pulses R radial;L radial;R dorsalis pedis;L dorsalis pedis  Edema Right lower extremity;Left lower extremity  RLE Edema Non-pitting  LLE Edema Non-pitting  RUE Neurovascular Assessment  R Radial Pulse +2  LUE Neurovascular Assessment  L Radial Pulse +2  RLE Neurovascular Assessment  R Dorsalis Pedis Pulse +2  LLE Neurovascular Assessment  L Dorsalis Pedis Pulse +2  Musculoskeletal  Musculoskeletal (WDL) X  Generalized Weakness Yes  Gastrointestinal  Gastrointestinal (WDL) X  Abdomen Inspection Soft  Bowel Sounds Assessment Active  Tenderness Nontender  Last BM Date   (UTA)  GU Assessment  Genitourinary (WDL) X  Genitourinary Symptoms Existing external catheter (condom catheter)  Psychosocial  Psychosocial (WDL) X  Patient Behaviors Not interactive  Integumentary  Integumentary (WDL) X  Nurse Assisting with Skin Assessment on Admission Clarita Cross, RN  Skin Color Pale  Skin Condition Dry  Skin Integrity Ecchymosis;Erythema/redness  Ecchymosis Location Arm  Ecchymosis Location Orientation Bilateral  Erythema/Redness Location Buttocks  Erythema/Redness Location Orientation Bilateral  Mobility Assessment: RN to perform for Med/Surg, Progressive Care, Stepdown, & Geropsych at admission/transfer/every shift/prn  Does patient have an order for bedrest or is patient medically unstable Yes- Bedfast (Level 1) - Complete  Braden Scale (Ages 8 and up)  Sensory Perceptions 2  Moisture 4  Activity 1  Mobility 2  Nutrition 2  Friction and Shear 2  Braden Scale Score 13  Braden Interventions  Required Braden Scale Bundle Interventions *See Row Information* At Risk for pressure injury (13-18) - At Risk requirements implemented  Neurological  Level of Consciousness Unresponsive (Dr. Jeane Miguel, Herbie Loll made aware)   Patient was given  Ativan at 1048 and Haldol at 0759 in the emergency department. Dr. Jeane Miguel, Herbie Loll was made aware that patient is currently not responding to stimulus'. Vitals signs are within normal limits. Dr. Jeane Miguel, Herbie Loll states another ABG is not needed at this time. Informed [facility]. [employer] RN notified of patient yellow mews status related to level of consciousness. Patient's respirations are unlabored and regular at this time.

## 2023-12-18 NOTE — ED Notes (Signed)
 CCMD contacted to place pt on monitor

## 2023-12-18 NOTE — Progress Notes (Signed)
   12/18/23 1833  Glasgow Coma Scale  Eye Opening 3  Best Verbal Response (NON-intubated) 4  Best Motor Response 4  Glasgow Coma Scale Score 11  Respiratory  Respiratory Pattern Regular;Unlabored  Chest Assessment Chest expansion symmetrical  Bilateral Breath Sounds Clear  Respiratory (WDL) X  Cardiac  Cardiac (WDL) X  Pulse Regular  Heart Sounds S1, S2  Jugular Venous Distention (JVD) No  ECG Monitor Yes  Neurological  Level of Consciousness Responds to Voice

## 2023-12-18 NOTE — Progress Notes (Signed)
 ED Pharmacy Antibiotic Sign Off An antibiotic consult was received from an ED provider for Vancomycin per pharmacy dosing for sepsis. A chart review was completed to assess appropriateness.   The following one time order(s) were placed:  Vancomycin 2500 mg IV X 1.   Further antibiotic and/or antibiotic pharmacy consults should be ordered by the admitting provider if indicated.   Thank you for allowing pharmacy to be a part of this patient's care.   Alvenia Job [facility]  Clinical Pharmacist 12/18/23 5:48 AM

## 2023-12-18 NOTE — ED Provider Notes (Signed)
  Physical Exam  BP 104/71   Pulse (!) 113   Temp 98.1 F (36.7 C) (Axillary)   Resp (!) 22   Ht 5\' 9"  (1.753 m)   Wt 100.2 kg   SpO2 100%   BMI 32.62 kg/m   Physical Exam  Procedures  Procedures  ED Course / MDM    Medical Decision Making Amount and/or Complexity of Data Reviewed Labs: ordered. Radiology: ordered.  Risk Prescription drug management. Decision regarding hospitalization.   Received patient in signout.  83 year old male with history of dementia, HTN, DM2, CAD, HFrEF, LV thrombus presenting today for weakness and altered mental status.  Reportedly weaker than his baseline and now unable to ambulate.  Initial provider noted some slight right leg weakness but was outside any window for thrombectomy as well as on Eliquis.  Patient was initially having concerns for sepsis and mild DKA on labs.  Also new AKI.  Signed out pending CT results with plan to admit once studies resulted. Also has UTI.  CT imaging of head shows no acute pathology on noncontrast scan.  CTA head and neck without obvious pathology.  CT abdomen/pelvis shows evidence of acute cystitis possibly extending into a pyelonephritis.  Given initial labs concerning for pyelonephritis with sepsis. Will admit to hospitalist for further care.     Kandee Orion, MD 12/18/23 [phone]

## 2023-12-18 NOTE — ED Triage Notes (Signed)
 Pt to ED from home c/o generalized weakness x1 day per wife. Wife stated to EMS that pt energy leverls have been decreased and he is struggling to ambulate independently. Pt is demented at baseline and at his neurological baseline of A&O x2-3. Respirations even, unlabored.   HX CVA

## 2023-12-18 NOTE — ED Notes (Signed)
Back to MRI

## 2023-12-18 NOTE — Inpatient Diabetes Management (Signed)
 Inpatient Diabetes Program Recommendations  AACE/ADA: New Consensus Statement on Inpatient Glycemic Control  Target Ranges:  Prepandial:   less than 140 mg/dL      Peak postprandial:   less than 180 mg/dL (1-2 hours)      Critically ill patients:  140 - 180 mg/dL    Latest Reference Range & Units 12/18/23 06:05 12/18/23 07:09 12/18/23 08:24  Glucose-Capillary 70 - 99 mg/dL 409 (H) 811 (H) 914 (H)    Latest Reference Range & Units 12/18/23 04:24 12/18/23 07:00  CO2 22 - 32 mmol/L 16 (L) 17 (L)  Glucose 70 - 99 mg/dL 782 (H) 956 (H)  Anion gap 5 - 15  19 (H) 16 (H)    Latest Reference Range & Units 12/18/23 04:24  Beta-Hydroxybutyric Acid 0.05 - 0.27 mmol/L 0.56 (H)    Latest Reference Range & Units 12/18/23 07:00  Lactic Acid, Venous 0.5 - 1.9 mmol/L 6.5 (HH)    Review of Glycemic Control  Diabetes history: DM2 Outpatient Diabetes medications: Amaryl  2 mg QAM, Metformin  1000 mg BID Current orders for Inpatient glycemic control: IV insulin  Inpatient Diabetes Program Recommendations:    Insulin: At transition from IV to SQ insulin, would not recommend ordering any basal insulin; consider ordering CBGs Q4H, and Novolog 0-9 units Q4H.  NOTE: Patient currently in ED and presented from home with generalized weakness; hx of advanced dementia. Per note by Dr. Margery Sheets today, patient's wife reports patient has had poor PO intake for past several days. Question if acidosis more likely related to starvation ketosis versus DKA.  Also noted lactic acid 6.5 on 12/18/23.  Thanks, Beacher Limerick, RN, MSN, CDCES Diabetes Coordinator Inpatient Diabetes Program [phone] (Team Pager from 8am to 5pm)

## 2023-12-19 ENCOUNTER — Inpatient Hospital Stay

## 2023-12-19 DIAGNOSIS — N179 Acute kidney failure, unspecified: Secondary | ICD-10-CM | POA: Diagnosis not present

## 2023-12-19 LAB — BASIC METABOLIC PANEL WITH GFR
Anion gap: 12 (ref 5–15)
Anion gap: 7 (ref 5–15)
BUN: 21 mg/dL (ref 8–23)
BUN: 18 mg/dL (ref 8–23)
CO2: 18 mmol/L — ABNORMAL LOW (ref 22–32)
CO2: 23 mmol/L (ref 22–32)
Calcium: 8.2 mg/dL — ABNORMAL LOW (ref 8.9–10.3)
Calcium: 8.1 mg/dL — ABNORMAL LOW (ref 8.9–10.3)
Chloride: 115 mmol/L — ABNORMAL HIGH (ref 98–111)
Chloride: 111 mmol/L (ref 98–111)
Creatinine, Ser: 1.08 mg/dL (ref 0.61–1.24)
Creatinine, Ser: 1.16 mg/dL (ref 0.61–1.24)
GFR, Estimated: >60 mL/min (ref >60)
GFR, Estimated: >60 mL/min (ref >60)
Glucose, Bld: 124 mg/dL — ABNORMAL HIGH (ref 70–99)
Glucose, Bld: 133 mg/dL — ABNORMAL HIGH (ref 70–99)
Potassium: 4.2 mmol/L (ref 3.5–5.1)
Potassium: 3.9 mmol/L (ref 3.5–5.1)
Sodium: 145 mmol/L (ref 135–145)
Sodium: 141 mmol/L (ref 135–145)

## 2023-12-19 LAB — CBC
HCT: 35.3 % — ABNORMAL LOW (ref 39.0–52.0)
Hemoglobin: 11.3 g/dL — ABNORMAL LOW (ref 13.0–17.0)
MCH: 30.1 pg (ref 26.0–34.0)
MCHC: 32 g/dL (ref 30.0–36.0)
MCV: 93.9 fL (ref 80.0–100.0)
Platelets: 114 10*3/uL — ABNORMAL LOW (ref 150–400)
RBC: 3.76 MIL/uL — ABNORMAL LOW (ref 4.22–5.81)
RDW: 14.8 % (ref 11.5–15.5)
WBC: 6.6 10*3/uL (ref 4.0–10.5)
nRBC: 0 % (ref 0.0–0.2)

## 2023-12-19 LAB — BETA-HYDROXYBUTYRIC ACID: Beta-Hydroxybutyric Acid: 0.75 mmol/L — ABNORMAL HIGH (ref 0.05–0.27)

## 2023-12-19 LAB — GLUCOSE, CAPILLARY
Glucose-Capillary: 131 mg/dL — ABNORMAL HIGH (ref 70–99)
Glucose-Capillary: 231 mg/dL — ABNORMAL HIGH (ref 70–99)
Glucose-Capillary: 223 mg/dL — ABNORMAL HIGH (ref 70–99)
Glucose-Capillary: 247 mg/dL — ABNORMAL HIGH (ref 70–99)

## 2023-12-19 MED ORDER — HALOPERIDOL LACTATE 5 MG/ML IJ SOLN
2.0000 mg | Freq: Four times a day (QID) | INTRAMUSCULAR | Status: DC | PRN
  Administered 2023-12-19 – 2023-12-20 (×2): 2 mg via INTRAMUSCULAR
  Filled 2023-12-19 (×2): qty 1

## 2023-12-19 NOTE — Evaluation (Signed)
 Occupational Therapy Evaluation Patient Details Name: Luke Chen MRN: [id number] DOB: 1941/01/19 Today's Date: 12/19/2023   History of Present Illness   Pt is an 83 y.o. 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 Workup for potential sepsis, mild DKA, AKI, UTI.     Clinical Impressions Pt was seen for OT evaluation this date. PTA, pt lives with his wife and is IND with all tasks without AD use. No falls reported in the last 6 months. Pt presents to acute OT demonstrating impaired ADL performance and functional mobility 2/2 weakness, balance deficits, and dementia with inability to follow commands consistently. Oriented to person and wife in room only, otherwise thought he was trying to get in the truck during session. Pt currently requires supervision for bed mobility with increased time and cueing to forward scoot. [facility] from EOB safely. Attempted to utilize RW, however pt unable to follow commands to use it properly therefore opted to use HHA x1 and pt held bed rail with the other to walk around the bed. Urine incontinence noted required max A for clean up. Able to doff/don socks for LB dressing at EOB with Min A. Pt needs frequent redirection and is very restless during afternoon session.  Pt would benefit from skilled OT services to address noted impairments and functional limitations to maximize safety and independence while minimizing falls risk and caregiver burden. Do anticipate the need for follow up OT services upon acute hospital DC.      If plan is discharge home, recommend the following:   A little help with walking and/or transfers;A little help with bathing/dressing/bathroom;Help with stairs or ramp for entrance     Functional Status Assessment    Patient has had a recent decline in their functional status and demonstrates the ability to make significant improvements in function in a reasonable and predictable amount of time.     Equipment Recommendations   Other (comment) (defer to next venue)     Recommendations for Other Services         Precautions/Restrictions   Precautions Precautions: Fall Recall of Precautions/Restrictions: Impaired Restrictions Weight Bearing Restrictions Per Provider Order: No     Mobility Bed Mobility Overal bed mobility: Needs Assistance Bed Mobility: Supine to Sit     Supine to sit: Supervision, HOB elevated, Used rails     General bed mobility comments: increased time and cueing to forward scoot    Transfers Overall transfer level: Needs assistance Equipment used: Rolling walker (2 wheels), None Transfers: Sit to/from Stand Sit to Stand: Contact guard assist, Min assist           General transfer comment: inital STS pt did not scoot forward and unable to make it to full stand; 3 other attempts during session with CGA pushing from bed; incont urine in floor, would not hold onto RW therefore held bed rail and HHA from therapist to walk around the bed and sit in recliner with Min A      Balance Overall balance assessment: Needs assistance Sitting-balance support: Feet supported Sitting balance-Leahy Scale: Fair Sitting balance - Comments: no LOB while donning/doffing socks   Standing balance support: Bilateral upper extremity supported, During functional activity Standing balance-Leahy Scale: Fair Standing balance comment: MIN/CGA via HHA and support on bedrail  ADL either performed or assessed with clinical judgement   ADL Overall ADL's : Needs assistance/impaired                     Lower Body Dressing: Minimal assistance;Sitting/lateral leans Lower Body Dressing Details (indicate cue type and reason): doff/don socks  seated EOB     Toileting- Clothing Manipulation and Hygiene: Maximal assistance;Sitting/lateral lean Toileting - Clothing Manipulation Details (indicate cue type and reason): clean-up after incont urine in floor     Functional mobility during ADLs: Contact guard assist;Cueing for safety General ADL Comments: HHA-unable to safely use RW     Vision         Perception         Praxis         Pertinent Vitals/Pain Pain Assessment Pain Assessment: No/denies pain     Extremity/Trunk Assessment Upper Extremity Assessment Upper Extremity Assessment: Generalized weakness   Lower Extremity Assessment Lower Extremity Assessment: Generalized weakness       Communication Communication Factors Affecting Communication: Hearing impaired   Cognition Arousal: Alert Behavior During Therapy: Restless Cognition: History of cognitive impairments             OT - Cognition Comments: dementia-only oriented to person and wife in room; kept stating I'm taking him to get into the back of the truck                 Following commands: Impaired Following commands impaired: Follows one step commands inconsistently     Cueing  General Comments   Cueing Techniques: Verbal cues;Gestural cues;Tactile cues  unable to follow commands consistently   Exercises     Shoulder Instructions      Home Living Family/patient expects to be discharged to:: Private residence Living Arrangements: Spouse/significant other Available Help at Discharge: Family Type of Home: House Home Access: Stairs to enter Secretary/administrator of Steps: 2 Entrance Stairs-Rails: Can reach both;Left;Right Home Layout: One level     Bathroom Shower/Tub: Chief Strategy Officer: Handicapped height     Home Equipment: Rollator (4 wheels);Cane - single point          Prior Functioning/Environment Prior Level of Function : Independent/Modified Independent             Mobility  Comments: no AD and no falls ADLs Comments: IND    OT Problem List: Decreased strength;Decreased cognition;Impaired balance (sitting and/or standing);Decreased safety awareness   OT Treatment/Interventions: Self-care/ADL training;Balance training;Therapeutic exercise;Therapeutic activities;Patient/family education      OT Goals(Current goals can be found in the care plan section)   Acute Rehab OT Goals Patient Stated Goal: improve function OT Goal Formulation: With patient/family Time For Goal Achievement: 01/02/24 Potential to Achieve Goals: Fair ADL Goals Pt Will Perform Lower Body Bathing: with supervision;sit to/from stand;sitting/lateral leans Pt Will Perform Lower Body Dressing: with supervision;sit to/from stand;sitting/lateral leans Pt Will Transfer to Toilet: with supervision;ambulating;regular height toilet   OT Frequency:  Min 3X/week    Co-evaluation              AM-PAC OT "6 Clicks" Daily Activity     Outcome Measure Help from another person eating meals?: None Help from another person taking care of personal grooming?: A Little Help from another person toileting, which includes using toliet, bedpan, or urinal?: A Lot Help from another person bathing (including washing, rinsing, drying)?: A Lot Help from another person to put on and taking off regular upper body clothing?: A Little Help  from another person to put on and taking off regular lower body clothing?: A Little 6 Click Score: 17   End of Session Equipment Utilized During Treatment: Gait belt;Rolling walker (2 wheels) Nurse Communication: Mobility status  Activity Tolerance: Patient tolerated treatment well Patient left: in chair;with call bell/phone within reach;with chair alarm set;with family/visitor present  OT Visit Diagnosis: Other abnormalities of gait and mobility (R26.89);Unsteadiness on feet (R26.81);Muscle weakness (generalized) (M62.81)                Time: 1610-9604 OT Time  Calculation (min): 25 min Charges:  OT General Charges $OT Visit: 1 Visit OT Evaluation $OT Eval Moderate Complexity: 1 Mod OT Treatments $Self Care/Home Management : 8-22 mins  Truxton Stupka, OTR/L 12/19/23, 3:19 PM   Chayse Zatarain E Brisa Auth 12/19/2023, 3:15 PM

## 2023-12-19 NOTE — Plan of Care (Signed)

## 2023-12-19 NOTE — Progress Notes (Signed)
 Patient is alert and oriented X 1. He is trying to get up from the bed  more frequently and not able to follows any commands. Demonstrated poor safety awareness.MD made aware. Inj Haldol Given as order. It has little effect on him.plan of care ongoing.

## 2023-12-19 NOTE — Progress Notes (Signed)
 Progress Note    Luke Chen  HQI:[id number] DOB: 1941-04-10  DOA: 12/18/2023 PCP: Wilnette Haste., Theron Flavin, MD      Brief Narrative:    Medical records reviewed and are as summarized below:  Luke Chen is a 83 y.o. 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       Assessment/Plan:   Principal Problem:   AKI (acute kidney injury) (HCC) Active Problems:   AMS (altered mental status)   Impaired ambulation   UTI (urinary tract infection)   Sepsis (HCC)    Body mass index is 32.62 kg/m.  (Obesity)  Sepsis secondary to acute complicated UTI, nephrolithiasis: Urine culture showed Proteus mirabilis.  Continue IV ceftriaxone .   AKI: Improved.  Creatinine down from 1.74-1.16.   Acute metabolic encephalopathy, underlying dementia: MRI negative for acute stroke. Continue supportive care.  IM Haldol as needed for agitation.   Type II DM with hyperglycemia,?  Mild DKA: Briefly required insulin drip.  Continue Lantus and NovoLog.   Metformin  and glipizide  on hold Hemoglobin A1c 10.1   Chronic HFrEF: Compensated.  Continue Toprol-XL.  Aldactone on hold. History of LV thrombus: Continue Eliquis CAD with coronary stent to LCx in July 2022: Continue Plavix History of stroke   Diet Order             Diet heart healthy/carb modified Room service appropriate? Yes; Fluid consistency: Thin  Diet effective now                            Consultants: None  Procedures: None    Medications:    apixaban  5 mg Oral BID   atorvastatin   40 mg Oral Daily   clopidogrel  75 mg Oral Daily   donepezil   10 mg Oral Q1400   feeding supplement  237 mL Oral BID BM   insulin aspart  0-5 Units Subcutaneous QHS    insulin aspart  0-9 Units Subcutaneous TID WC   metoprolol succinate  12.5 mg Oral Daily   Continuous Infusions:  cefTRIAXone  (ROCEPHIN )  IV 1 g (12/19/23 0958)     Anti-infectives (From admission, onward)    Start     Dose/Rate Route Frequency Ordered Stop   12/19/23 1000  cefTRIAXone  (ROCEPHIN ) 1 g in sodium chloride  0.9 % 100 mL IVPB        1 g 200 mL/hr over 30 Minutes Intravenous Every 24 hours 12/18/23 1102     12/18/23 0615  metroNIDAZOLE (FLAGYL) IVPB 500 mg        500 mg 100 mL/hr over 60 Minutes Intravenous  Once 12/18/23 0607 12/18/23 0740   12/18/23 0600  vancomycin (VANCOCIN) IVPB 1000 mg/200 mL premix       Placed in "Followed by" Linked Group   1,000 mg 200 mL/hr over 60 Minutes Intravenous  Once 12/18/23 0548 12/18/23 0740   12/18/23 0600  vancomycin (VANCOREADY) IVPB 1500 mg/300 mL       Placed in "Followed by" Linked Group   1,500 mg 150 mL/hr over 120 Minutes Intravenous  Once 12/18/23 0548 12/18/23 1205   12/18/23 0545  ceFEPIme (MAXIPIME) 2 g in sodium chloride  0.9 % 100 mL IVPB        2 g  200 mL/hr over 30 Minutes Intravenous  Once 12/18/23 0538 12/18/23 0629   12/18/23 0545  metroNIDAZOLE (FLAGYL) tablet 2,000 mg  Status:  Discontinued        2,000 mg Oral  Once 12/18/23 9629 12/18/23 0607              Family Communication/Anticipated D/C date and plan/Code Status   DVT prophylaxis:  apixaban (ELIQUIS) tablet 5 mg     Code Status: Full Code  Family Communication: None Disposition Plan: Plan to discharge to SNF   Status is: Inpatient Remains inpatient appropriate because: Acute UTI       Subjective:   Interval events noted.  He is confused and unable to provide any history.  Objective:    Vitals:   12/19/23 0444 12/19/23 0802 12/19/23 1000 12/19/23 1149  BP: 129/63 119/61  102/60  Pulse: 71 78  75  Resp: 16 14  15   Temp: 97.7 F (36.5 C) (!) 97.5 F (36.4 C)  (!) 97.5 F (36.4 C)  TempSrc:      SpO2: 100% 100% 95% 100%   Weight:      Height:       No data found.   Intake/Output Summary (Last 24 hours) at 12/19/2023 1602 Last data filed at 12/19/2023 1518 Gross per 24 hour  Intake 480 ml  Output 800 ml  Net -320 ml   Filed Weights   12/18/23 0545  Weight: 100.2 kg    Exam:  GEN: NAD SKIN: Warm and dry EYES: No pallor or icterus ENT: MMM CV: RRR PULM: CTA B ABD: soft, ND, NT, +BS CNS: AAO x 1 (person), non focal EXT: No edema or tenderness        Data Reviewed:   I have personally reviewed following labs and imaging studies:  Labs: Labs show the following:   Basic Metabolic Panel: Recent Labs  Lab 12/18/23 1322 12/18/23 1557 12/18/23 2102 12/19/23 0038 12/19/23 0454  NA 139 142 142 145 141  K 3.6 3.7 3.8 4.2 3.9  CL 110 111 111 115* 111  CO2 21* 21* 22 18* 23  GLUCOSE 173* 158* 129* 124* 133*  BUN 26* 27* 22 21 18   CREATININE 1.41* 1.19 1.20 1.08 1.16  CALCIUM  7.9* 8.3* 8.3* 8.2* 8.1*   GFR Estimated Creatinine Clearance: 59.3 mL/min (by C-G formula based on SCr of 1.16 mg/dL). Liver Function Tests: Recent Labs  Lab 12/18/23 0424  AST 35  ALT 17  ALKPHOS 62  BILITOT 0.6  PROT 7.5  ALBUMIN 3.8   Recent Labs  Lab 12/18/23 0424  LIPASE 45   No results for input(s): "AMMONIA" in the last 168 hours. Coagulation profile Recent Labs  Lab 12/18/23 0424  INR 1.4*    CBC: Recent Labs  Lab 12/18/23 0424 12/19/23 0454  WBC 10.2 6.6  NEUTROABS 8.0*  --   HGB 13.2 11.3*  HCT 42.5 35.3*  MCV 95.7 93.9  PLT 161 114*   Cardiac Enzymes: Recent Labs  Lab 12/18/23 0424  CKTOTAL 142   BNP (last 3 results) No results for input(s): "PROBNP" in the last 8760 hours. CBG: Recent Labs  Lab 12/18/23 1143 12/18/23 1628 12/18/23 2144 12/19/23 0804 12/19/23 1154  GLUCAP 126* 148* 116* 131* 231*   D-Dimer: No results for input(s): "DDIMER" in the last 72 hours. Hgb A1c: Recent Labs    12/18/23 1322  HGBA1C 10.1*   Lipid Profile: No results for  input(s): "CHOL", "HDL", "LDLCALC", "TRIG", "CHOLHDL", "LDLDIRECT" in the last  72 hours. Thyroid  function studies: No results for input(s): "TSH", "T4TOTAL", "T3FREE", "THYROIDAB" in the last 72 hours.  Invalid input(s): "FREET3" Anemia work up: No results for input(s): "VITAMINB12", "FOLATE", "FERRITIN", "TIBC", "IRON", "RETICCTPCT" in the last 72 hours. Sepsis Labs: Recent Labs  Lab 12/18/23 0424 12/18/23 0700 12/18/23 1322 12/18/23 1557 12/19/23 0454  WBC 10.2  --   --   --  6.6  LATICACIDVEN >9.0* 6.5* 2.7* 2.4*  --     Microbiology Recent Results (from the past 240 hours)  Resp panel by RT-PCR (RSV, Flu A&B, Covid) Anterior Nasal Swab     Status: None   Collection Time: 12/18/23  4:24 AM   Specimen: Anterior Nasal Swab  Result Value Ref Range Status   SARS Coronavirus 2 by RT PCR NEGATIVE NEGATIVE Final    Comment: (NOTE) SARS-CoV-2 target nucleic acids are NOT DETECTED.  The SARS-CoV-2 RNA is generally detectable in upper respiratory specimens during the acute phase of infection. The lowest concentration of SARS-CoV-2 viral copies this assay can detect is 138 copies/mL. A negative result does not preclude SARS-Cov-2 infection and should not be used as the sole basis for treatment or other patient management decisions. A negative result may occur with  improper specimen collection/handling, submission of specimen other than nasopharyngeal swab, presence of viral mutation(s) within the areas targeted by this assay, and inadequate number of viral copies(<138 copies/mL). A negative result must be combined with clinical observations, patient history, and epidemiological information. The expected result is Negative.  Fact Sheet for Patients:  BloggerCourse.com  Fact Sheet for Healthcare Providers:  SeriousBroker.it  This test is no t yet approved or cleared by the [place]  FDA and  has been authorized for detection  and/or diagnosis of SARS-CoV-2 by FDA under an Emergency Use Authorization (EUA). This EUA will remain  in effect (meaning this test can be used) for the duration of the COVID-19 declaration under Section 564(b)(1) of the Act, 21 U.S.C.section 360bbb-3(b)(1), unless the authorization is terminated  or revoked sooner.       Influenza A by PCR NEGATIVE NEGATIVE Final   Influenza B by PCR NEGATIVE NEGATIVE Final    Comment: (NOTE) The Xpert Xpress SARS-CoV-2/FLU/RSV plus assay is intended as an aid in the diagnosis of influenza from Nasopharyngeal swab specimens and should not be used as a sole basis for treatment. Nasal washings and aspirates are unacceptable for Xpert Xpress SARS-CoV-2/FLU/RSV testing.  Fact Sheet for Patients: BloggerCourse.com  Fact Sheet for Healthcare Providers: SeriousBroker.it  This test is not yet approved or cleared by the [place]  FDA and has been authorized for detection and/or diagnosis of SARS-CoV-2 by FDA under an Emergency Use Authorization (EUA). This EUA will remain in effect (meaning this test can be used) for the duration of the COVID-19 declaration under Section 564(b)(1) of the Act, 21 U.S.C. section 360bbb-3(b)(1), unless the authorization is terminated or revoked.     Resp Syncytial Virus by PCR NEGATIVE NEGATIVE Final    Comment: (NOTE) Fact Sheet for Patients: BloggerCourse.com  Fact Sheet for Healthcare Providers: SeriousBroker.it  This test is not yet approved or cleared by the [place]  FDA and has been authorized for detection and/or diagnosis of SARS-CoV-2 by FDA under an Emergency Use Authorization (EUA). This EUA will remain in effect (meaning this test can be used) for the duration of the COVID-19 declaration under Section 564(b)(1) of the Act, 21 U.S.C. section 360bbb-3(b)(1), unless the authorization is terminated  or revoked.  Performed at [facility]  Lab, [address] [place], [place] [address]   Blood Culture (routine x 2)     Status: None (Preliminary result)   Collection Time: 12/18/23  6:31 AM   Specimen: BLOOD RIGHT ARM  Result Value Ref Range Status   Specimen Description BLOOD RIGHT ARM  Final   Special Requests   Final    BOTTLES DRAWN AEROBIC AND ANAEROBIC Blood Culture results may not be optimal due to an inadequate volume of blood received in culture bottles   Culture   Final    NO GROWTH 1 DAY Performed at [facility], [address] [place], [place] [address]    Report Status PENDING  Incomplete  Blood Culture (routine x 2)     Status: None (Preliminary result)   Collection Time: 12/18/23  6:31 AM   Specimen: BLOOD LEFT ARM  Result Value Ref Range Status   Specimen Description BLOOD LEFT ARM  Final   Special Requests   Final    BOTTLES DRAWN AEROBIC AND ANAEROBIC Blood Culture results may not be optimal due to an inadequate volume of blood received in culture bottles   Culture   Final    NO GROWTH 1 DAY Performed at [facility], [address], [place], [place] [address]    Report Status PENDING  Incomplete  Urine Culture     Status: Abnormal (Preliminary result)   Collection Time: 12/18/23  6:45 AM   Specimen: Urine, Random  Result Value Ref Range Status   Specimen Description   Final    URINE, RANDOM Performed at [facility], [address], [place], [place] [address]    Special Requests   Final    NONE Reflexed from [id number] Performed at [facility], [address] [place], [place] [address]    Culture (A)  Final    >=100,000 COLONIES/mL PROTEUS MIRABILIS SUSCEPTIBILITIES TO FOLLOW Performed at [facility], [address], [place], [place] [address]    Report Status PENDING  Incomplete    Procedures and diagnostic studies:  DG Chest 1 View Result Date: 12/19/2023 CLINICAL DATA:  CHF. EXAM: CHEST  1  VIEW COMPARISON:  12/18/2023 FINDINGS: Heart size is normal. Aortic atherosclerosis. Lung volumes are decreased. Mild platelike atelectasis in the right base. No pleural fluid or interstitial edema. No airspace consolidation. Visualized osseous structures appear intact. IMPRESSION: Low lung volumes and right base atelectasis. No pleural fluid or edema. Electronically Signed   By: Kimberley Penman M.D.   On: 12/19/2023 07:14   MR BRAIN WO CONTRAST Result Date: 12/18/2023 CLINICAL DATA:  Neuro deficit, acute, stroke suspected. Dementia. Altered mental status. Unable to walk. Generalized weakness. EXAM: MRI HEAD WITHOUT CONTRAST TECHNIQUE: Multiplanar, multiecho pulse sequences of the brain and surrounding structures were obtained without intravenous contrast. COMPARISON:  Head CT earlier same day FINDINGS: Brain: Diffusion imaging does not show any acute or subacute infarction or other cause of restricted diffusion. Small-vessel ischemic changes are seen affecting the pons. Few old small vessel cerebellar infarctions. Cerebral hemispheres show generalized atrophy. There are old bilateral occipital cortical and subcortical infarctions. Old bilateral parietal cortical and subcortical infarctions, larger on the left than the right. Chronic small-vessel ischemic changes of the left thalamus and cerebral hemispheric white matter. No mass, recent hemorrhage, hydrocephalus or extra-axial collection. Hemosiderin deposition in the region of the old occipital strokes. Vascular: Major vessels at the base of the brain show flow. Skull and upper cervical spine: Negative Sinuses/Orbits: Clear/normal Other: None IMPRESSION:  No acute finding. Generalized atrophy. Chronic small-vessel ischemic changes of the pons, left thalamus and cerebral hemispheric white matter. Old bilateral occipital and parietal cortical and subcortical infarctions. Electronically Signed   By: Bettylou Brunner M.D.   On: 12/18/2023 11:29   CT CHEST ABDOMEN PELVIS  W CONTRAST Result Date: 12/18/2023 CLINICAL DATA:  One day history of generalized weakness and refusal to walk EXAM: CT CHEST, ABDOMEN, AND PELVIS WITH CONTRAST TECHNIQUE: Multidetector CT imaging of the chest, abdomen and pelvis was performed following the standard protocol during bolus administration of intravenous contrast. RADIATION DOSE REDUCTION: This exam was performed according to the departmental dose-optimization program which includes automated exposure control, adjustment of the mA and/or kV according to patient size and/or use of iterative reconstruction technique. CONTRAST:  75mL OMNIPAQUE IOHEXOL 350 MG/ML SOLN COMPARISON:  Same day chest radiograph, CT abdomen and pelvis dated 05/16/2017, CT chest dated 10/31/2014 FINDINGS: CT CHEST FINDINGS Cardiovascular: Normal heart size. No significant pericardial fluid/thickening. Great vessels are normal in course and caliber. No central pulmonary emboli. Coronary artery calcifications. Mediastinum/Nodes: Imaged thyroid  gland without nodules meeting criteria for imaging follow-up by size. Normal esophagus. No pathologically enlarged axillary, supraclavicular, mediastinal, or hilar lymph nodes. Lungs/Pleura: The central airways are patent. Small volume secretions within the lower trachea. Left greater than right mild peribronchial wall thickening, most notable in the left lower lobe. Dependent ground-glass opacity within the left upper lobe. Bilateral lower lobe subpleural ground-glass opacity, likely atelectasis. No pneumothorax. No pleural effusion. Musculoskeletal: No acute or abnormal lytic or blastic osseous lesions. Multilevel degenerative changes of the thoracic spine. CT ABDOMEN PELVIS FINDINGS Hepatobiliary: No focal hepatic lesions. No intra or extrahepatic biliary ductal dilation. Normal gallbladder. Pancreas: No focal lesions or main ductal dilation. Spleen: Normal in size without focal abnormality. Adrenals/Urinary Tract: No adrenal nodules. Right  renal cortical scarring. No suspicious renal masses. Mild left hydroureteronephrosis to the level of the ureterovesical junction without definite obstructing stone. No right hydronephrosis. Punctate nonobstructing bilateral renal stones. Underdistended urinary bladder with irregular circumferential mural thickening, midline superior diverticulum, and mild pericystic stranding. Bladder contains a large oval bladder stone. Stomach/Bowel: Normal appearance of the stomach. No evidence of bowel wall thickening, distention, or inflammatory changes. Colonic diverticulosis without acute diverticulitis. Appendix is not discretely seen. Vascular/Lymphatic: Aortic atherosclerosis. No enlarged abdominal or pelvic lymph nodes. Reproductive: Mild enlargement of the prostate gland. Other: No free fluid, fluid collection, or free air. Musculoskeletal: No acute or abnormal lytic or blastic osseous findings. Multilevel degenerative changes of the lumbar spine. Small fat-containing left inguinal hernia. 1.6 cm mildly thick-walled subcutaneous cystic focus within the right posterior paramidline region overlying the level of T12-L1 (3:57). IMPRESSION: 1. Mild left hydroureteronephrosis to the level of the ureterovesical junction without definite obstructing stone, which may reflect ascending urinary tract infection. 2. Underdistended urinary bladder with irregular circumferential mural thickening, midline superior diverticulum, and mild pericystic stranding, which may represent cystitis on a background of chronic outlet obstruction. 3. Left greater than right mild peribronchial wall thickening, most notable in the left lower lobe, which may be seen in the setting of bronchitis. 4. Dependent ground-glass opacity within the left upper lobe, which may represent aspiration. 5. A 1.6 cm subcutaneous cystic focus within the right posterior paramidline region overlying the level of T12-L1 may represent an epidermal inclusion cyst. 6. Aortic  Atherosclerosis (ICD10-I70.0). Coronary artery calcifications. Assessment for potential risk factor modification, dietary therapy or pharmacologic therapy may be warranted, if clinically indicated. Electronically Signed   By: Limin  Xu M.D.   On: 12/18/2023 09:56   CT Angio Head Neck W WO CM Result Date: 12/18/2023 CLINICAL DATA:  Stroke suspected EXAM: CT ANGIOGRAPHY HEAD AND NECK WITH AND WITHOUT CONTRAST TECHNIQUE: Multidetector CT imaging of the head and neck was performed using the standard protocol during bolus administration of intravenous contrast. Multiplanar CT image reconstructions and MIPs were obtained to evaluate the vascular anatomy. Carotid stenosis measurements (when applicable) are obtained utilizing NASCET criteria, using the distal internal carotid diameter as the denominator. RADIATION DOSE REDUCTION: This exam was performed according to the departmental dose-optimization program which includes automated exposure control, adjustment of the mA and/or kV according to patient size and/or use of iterative reconstruction technique. CONTRAST:  75mL OMNIPAQUE IOHEXOL 350 MG/ML SOLN COMPARISON:  Head CT from earlier today FINDINGS: CT HEAD FINDINGS Brain: Patchy chronic infarcts along the bilateral occipital parietal and left parietal cortex. No acute hemorrhage, hydrocephalus, mass, or collection. Vascular: No hyperdense vessel Skull: Normal. Negative for fracture or focal lesion. Sinuses/Orbits: No acute finding. Review of the MIP images confirms the above findings CTA NECK FINDINGS Aortic arch: Extensive atheromatous plaque where partially covered. Notable plaque at the great vessel ostia. Right carotid system: Mixed density plaque at the brachiocephalic origin causes 70% stenosis. Mild atheromatous plaque in the carotid circulation without flow reducing stenosis or ulceration. Left carotid system: Mild generalized atheromatous wall thickening of the common and proximal internal carotid arteries.  No focal or flow reducing stenosis. Vertebral arteries: No flow reducing stenosis in the subclavian arteries. Atheromatous irregularity of the vertebral arteries without visible flow reducing stenosis. The right vertebral origin is obscured by streak artifact from intravenous contrast. Skeleton: Areas of sclerosis primarily along degenerative spurring and at the edentulous mandible. No focal or aggressive lesion seen. Other neck: No acute finding Upper chest: No acute finding Review of the MIP images confirms the above findings CTA HEAD FINDINGS Anterior circulation: Atheromatous calcification of the carotid siphons with approximally 50% left paraclinoid ICA narrowing. No major branch occlusion, beading, or aneurysm Posterior circulation: The right vertebral artery ends in the right PICA. Robust flow in the left vertebral and basilar arteries. Generalized atheromatous irregularity and attenuation of the posterior cerebral arteries without reversible proximal stenosis or major branch occlusion. Venous sinuses: Unremarkable for arterial timing Anatomic variants: None significant Review of the MIP images confirms the above findings IMPRESSION: No emergent arterial finding. Atherosclerosis in the neck, most notably 70% atheromatous narrowing at the brachiocephalic origin. Extensive intracranial atherosclerosis with 50% left paraclinoid ICA narrowing Electronically Signed   By: Ronnette Coke M.D.   On: 12/18/2023 09:23   DG Chest Port 1 View Result Date: 12/18/2023 CLINICAL DATA:  Chest pain EXAM: PORTABLE CHEST 1 VIEW COMPARISON:  10/31/2014 FINDINGS: Normal heart size and mediastinal contours.  Coronary stenting. There is no edema, consolidation, effusion, or pneumothorax. Bilateral skin folds. Artifact from EKG leads. IMPRESSION: No evidence of acute disease. Electronically Signed   By: Ronnette Coke M.D.   On: 12/18/2023 05:15   CT Head Wo Contrast Result Date: 12/18/2023 CLINICAL DATA:  Mental status change  with unknown cause EXAM: CT HEAD WITHOUT CONTRAST TECHNIQUE: Contiguous axial images were obtained from the base of the skull through the vertex without intravenous contrast. RADIATION DOSE REDUCTION: This exam was performed according to the departmental dose-optimization program which includes automated exposure control, adjustment of the mA and/or kV according to patient size and/or use of iterative reconstruction technique. COMPARISON:  02/05/2019 FINDINGS: Brain: Moderate area of low-density involving  left parietal cortex with areas approaching CSF density and volume loss, chronic findings. Even more chronic and low-density appearing infarcts in the bilateral occipital parietal cortex. Generalized brain atrophy that has progressed from 2020. Small chronic infarct in the right cerebellum and likely at the vermis. No acute hemorrhage, hydrocephalus, or mass. Vascular: No hyperdense vessel or unexpected calcification. Skull: Normal. Negative for fracture or focal lesion. Sinuses/Orbits: No acute finding. IMPRESSION: No acute finding. Chronic bilateral occipital parietal infarcts since 2020. Generalized brain atrophy that is progressed from 2020. Electronically Signed   By: Ronnette Coke M.D.   On: 12/18/2023 05:04               LOS: 1 day   Luke Chen  [facility]   Pager on [email or web address]. If 7PM-7AM, please contact night-coverage at [email or web address]     12/19/2023, 4:02 PM

## 2023-12-19 NOTE — Evaluation (Signed)
 Physical Therapy Evaluation Patient Details Name: Luke Chen MRN: [id number] DOB: [birth year] Today's Date: 12/19/2023  History of Present Illness  Pt is an 83 y.o. 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 Workup for potential sepsis, mild DKA, AKI, UTI.   Clinical Impression  Pt wakes to voice and touch, and after being awake and repositioned, oriented to self/birthday and family in room. Per pt's wife pt is normally oriented x3, independent in ADLs, ambulates without AD, no falls in the last 6 months.   He was able to perform bed mobility with light minA, verbal/tactile cues to stay on task. Sit <> stand several times, initially without RW, CGA but noted to be unsteady. He took a few steps in room without AD, but unsteady, minA needed. with RW improved steadiness but constant assist for RW management and to navigate environment, ambulated ~51ft total. spO2 on room air 95% or greater, RN notified (pt doffing [place] at start of session, does not use O2 at home per wife).  Overall the patient demonstrated deficits (see "PT Problem List") that impede the patient's functional abilities, safety, and mobility and would benefit from skilled PT intervention.           If plan is discharge home, recommend the following: A little help with walking and/or transfers;A little help with bathing/dressing/bathroom;Assistance with cooking/housework;Supervision due to cognitive status;Assist for transportation;Help with stairs or ramp for entrance;Direct supervision/assist for medications management   Can travel by private vehicle   Yes    Equipment Recommendations Other (comment) (TBD)  Recommendations for Other Services       Functional Status Assessment Patient has had a recent decline in  their functional status and demonstrates the ability to make significant improvements in function in a reasonable and predictable amount of time.     Precautions / Restrictions Precautions Precautions: Fall Recall of Precautions/Restrictions: Impaired Restrictions Weight Bearing Restrictions Per Provider Order: No      Mobility  Bed Mobility Overal bed mobility: Needs Assistance Bed Mobility: Supine to Sit     Supine to sit: Contact guard, Min assist, HOB elevated     General bed mobility comments: light minA to assist with weight shift    Transfers Overall transfer level: Needs assistance Equipment used: Rolling walker (2 wheels), None Transfers: Sit to/from Stand Sit to Stand: Contact guard assist           General transfer comment: effortful and needed momentum, but able to complete with CGA    Ambulation/Gait Ambulation/Gait assistance: Min assist Gait Distance (Feet): 25 Feet Assistive device: Rolling walker (2 wheels), None         General Gait Details: able to take a few steps in room without AD, but unsteady, minA needed. with RW improved steadiness but constant assist for RW management and to navigate environment  Stairs            Wheelchair Mobility     Tilt Bed    Modified Rankin (Stroke Patients Only)       Balance Overall balance assessment: Needs assistance Sitting-balance support: Feet supported Sitting balance-Leahy Scale: Fair     Standing balance support: Bilateral upper extremity supported, During functional activity Standing balance-Leahy Scale: Fair  Pertinent Vitals/Pain Pain Assessment Pain Assessment: Faces Faces Pain Scale: No hurt    Home Living Family/patient expects to be discharged to:: Private residence Living Arrangements: Spouse/significant other Available Help at Discharge: Family Type of Home: House Home Access: Stairs to enter Entrance Stairs-Rails: Can  reach both;Left;Right Entrance Stairs-Number of Steps: 2   Home Layout: One level Home Equipment: Rollator (4 wheels);Cane - single point      Prior Function Prior Level of Function : Independent/Modified Independent                     Extremity/Trunk Assessment   Upper Extremity Assessment Upper Extremity Assessment: Generalized weakness    Lower Extremity Assessment Lower Extremity Assessment: Generalized weakness (able to lift BLE against gravity)       Communication   Communication Factors Affecting Communication: Hearing impaired    Cognition Arousal: Alert Behavior During Therapy: WFL for tasks assessed/performed                           PT - Cognition Comments: pt oriented to self, and family in room once more awake. wife at baseline said he is normally oriented x3 Following commands: Intact       Cueing Cueing Techniques: Verbal cues, Gestural cues, Tactile cues     General Comments      Exercises     Assessment/Plan    PT Assessment Patient needs continued PT services  PT Problem List Decreased strength;Decreased range of motion;Decreased activity tolerance;Decreased mobility;Decreased balance;Decreased knowledge of use of DME;Decreased cognition;Decreased safety awareness;Decreased knowledge of precautions       PT Treatment Interventions DME instruction;Balance training;Gait training;Neuromuscular re-education;Stair training;Functional mobility training;Patient/family education;Therapeutic activities;Therapeutic exercise    PT Goals (Current goals can be found in the Care Plan section)  Acute Rehab PT Goals Patient Stated Goal: to return to PLOF PT Goal Formulation: With family Time For Goal Achievement: 01/02/24 Potential to Achieve Goals: Good    Frequency Min 3X/week     Co-evaluation               AM-PAC PT "6 Clicks" Mobility  Outcome Measure Help needed turning from your back to your side while in a flat  bed without using bedrails?: A Little Help needed moving from lying on your back to sitting on the side of a flat bed without using bedrails?: A Little Help needed moving to and from a bed to a chair (including a wheelchair)?: A Little Help needed standing up from a chair using your arms (e.g., wheelchair or bedside chair)?: A Little Help needed to walk in hospital room?: A Little Help needed climbing 3-5 steps with a railing? : A Little 6 Click Score: 18    End of Session Equipment Utilized During Treatment: Gait belt Activity Tolerance: Patient tolerated treatment well Patient left: with call bell/phone within reach;in chair;with chair alarm set Nurse Communication: Mobility status PT Visit Diagnosis: Other abnormalities of gait and mobility (R26.89);Difficulty in walking, not elsewhere classified (R26.2);Muscle weakness (generalized) (M62.81)    Time: 1610-9604 PT Time Calculation (min) (ACUTE ONLY): 27 min   Charges:   PT Evaluation $PT Eval Low Complexity: 1 Low PT Treatments $Therapeutic Activity: 23-37 mins PT General Charges $$ ACUTE PT VISIT: 1 Visit         Darien Eden PT, DPT 10:38 AM,12/19/23

## 2023-12-20 DIAGNOSIS — N179 Acute kidney failure, unspecified: Secondary | ICD-10-CM | POA: Diagnosis not present

## 2023-12-20 LAB — BLOOD GAS, VENOUS
Bicarbonate: 19.2 mmol/L — ABNORMAL LOW (ref 20.0–28.0)
O2 Saturation: 47.7 mmol/L — ABNORMAL HIGH (ref 0.0–2.0)
Patient temperature: 37
Patient temperature: 47.7 %
pCO2, Ven: 39 mmHg — ABNORMAL LOW (ref 44–60)
pH, Ven: 7.3 (ref 7.25–7.43)
pO2, Ven: 31 mmol/L — CL (ref 32–45)

## 2023-12-20 LAB — URINE CULTURE: Culture: >=100000 — AB

## 2023-12-20 LAB — GLUCOSE, CAPILLARY
Glucose-Capillary: 234 mg/dL — ABNORMAL HIGH (ref 70–99)
Glucose-Capillary: 252 mg/dL — ABNORMAL HIGH (ref 70–99)
Glucose-Capillary: 251 mg/dL — ABNORMAL HIGH (ref 70–99)
Glucose-Capillary: 228 mg/dL — ABNORMAL HIGH (ref 70–99)

## 2023-12-20 MED ORDER — ACETAMINOPHEN 325 MG PO TABS: 650.0000 mg | ORAL_TABLET | Freq: Four times a day (QID) | ORAL | Status: DC | PRN

## 2023-12-20 MED ORDER — INSULIN GLARGINE-YFGN 100 UNIT/ML ~~LOC~~ SOLN
10.0000 [IU] | Freq: Every evening | SUBCUTANEOUS | Status: DC
  Administered 2023-12-20 – 2023-12-22 (×3): 10 [IU] via SUBCUTANEOUS
  Filled 2023-12-20 (×4): qty 0.1

## 2023-12-20 MED ORDER — LISINOPRIL 5 MG PO TABS
5.0000 mg | ORAL_TABLET | Freq: Every day | ORAL | Status: DC
  Administered 2023-12-20 – 2023-12-23 (×3): 5 mg via ORAL
  Filled 2023-12-20 (×4): qty 1

## 2023-12-20 MED ORDER — AMOXICILLIN 500 MG PO CAPS
500.0000 mg | ORAL_CAPSULE | Freq: Three times a day (TID) | ORAL | Status: DC
  Administered 2023-12-21 – 2023-12-23 (×6): 500 mg via ORAL
  Filled 2023-12-20 (×9): qty 1

## 2023-12-20 NOTE — Plan of Care (Signed)

## 2023-12-20 NOTE — TOC Progression Note (Signed)
 Transition of Care [facility]) - Progression Note    Patient Details  Name: Luke Chen MRN: [id number] Date of Birth: 1940-11-22  Transition of Care [facility]) CM/SW Contact  Elsie Halo, RN Phone Number: 12/20/2023, 4:01 PM  Clinical Narrative:    [facility] visited patient in his room and he was asleep upon awakening the patient is a poor historian. Per the medical record he is from home with wife. TOC called patient's wife [phone]  and didn't get an answer.  SNF w/u started. Awaiting bed offers, will offer choice when offers received.  TOC will continue to follow.        Expected Discharge Plan and Services                                               Social Determinants of Health (SDOH) Interventions SDOH Screenings   Food Insecurity: No Food Insecurity (08/26/2021)  Housing: Unknown (11/28/2023)   Received from [facility]  Transportation Needs: No Transportation Needs (08/26/2021)  Alcohol Screen: Low Risk  (08/26/2021)  Depression (PHQ2-9): Low Risk  (08/26/2021)  Financial Resource Strain: Low Risk  (08/26/2021)  Physical Activity: Inactive (08/26/2021)  Social Connections: Moderately Isolated (08/26/2021)  Stress: No Stress Concern Present (08/26/2021)  Tobacco Use: Low Risk  (12/18/2023)    Readmission Risk Interventions     No data to display

## 2023-12-20 NOTE — Inpatient Diabetes Management (Signed)
 Inpatient Diabetes Program Recommendations  AACE/ADA: New Consensus Statement on Inpatient Glycemic Control (2015)  Target Ranges:  Prepandial:   less than 140 mg/dL      Peak postprandial:   less than 180 mg/dL (1-2 hours)      Critically ill patients:  140 - 180 mg/dL    Latest Reference Range & Units 12/19/23 08:04 12/19/23 11:54 12/19/23 17:17 12/19/23 20:04  Glucose-Capillary 70 - 99 mg/dL 784 (H)  1 unit Novolog  231 (H)  3 units Novolog  223 (H)  3 units Novolog  247 (H)  2 units Novolog   (H): Data is abnormally high  Latest Reference Range & Units 12/20/23 07:58  Glucose-Capillary 70 - 99 mg/dL 696 (H)  3 units Novolog   (H): Data is abnormally high   Home Meds: Amaryl  2 mg daily            Metformin  1000 mg BID   Current Orders: Novolog Sensitive Correction Scale/ SSI (0-9 units) TID AC + HS     MD- Note CBG 234 this AM.  CBGs >200 yest afternoon.  Please consider starting Semglee 10 units daily (0.1 units/kg)    --Will follow patient during hospitalization--  Langston Pippins RN, MSN, CDCES Diabetes Coordinator Inpatient Glycemic Control Team Team Pager: [phone] (8a-5p)

## 2023-12-20 NOTE — NC FL2 (Signed)
 [place]  MEDICAID FL2 LEVEL OF CARE FORM     IDENTIFICATION  Patient Name: Luke Chen Birthdate: 10/14/1940 Sex: male Admission Date (Current Location): 12/18/2023  [facility] and [place] Number:  Chiropodist and Address:  [facility], [address] [place], [place] [address]      Provider Number: [id number]  Attending Physician Name and Address:  Sheril Dines, MD  Relative Name and Phone Number:  Ireoluwa Soisson [phone]    Current Level of Care: SNF Recommended Level of Care: Skilled Nursing Facility Prior Approval Number:    Date Approved/Denied:   PASRR Number:    Discharge Plan: SNF    Current Diagnoses: Patient Active Problem List   Diagnosis Date Noted   AMS (altered mental status) 12/18/2023   Impaired ambulation 12/18/2023   AKI (acute kidney injury) (HCC) 12/18/2023   UTI (urinary tract infection) 12/18/2023   Sepsis (HCC) 12/18/2023   Hematuria 01/02/2022   Hospital discharge follow-up 11/22/2020   Rectal exam 05/31/2020   Diabetes 1.5, 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/2020   Essential hypertension 03/23/2020   Late onset Alzheimer's disease without behavioral disturbance (HCC) 03/23/2020   COVID-19 virus infection 03/23/2020   History of 2019 novel coronavirus disease (COVID-19) 03/23/2020   Troponin level elevated 02/05/2019    Orientation RESPIRATION BLADDER Height & Weight     Self, Time, Situation    External catheter Weight: 100.2 kg Height:  5\' 9"  (175.3 cm)  BEHAVIORAL SYMPTOMS/MOOD NEUROLOGICAL BOWEL NUTRITION STATUS      Continent Diet (Carb Modified)  AMBULATORY STATUS COMMUNICATION OF NEEDS Skin   Limited Assist                           Personal Care Assistance Level of Assistance  Bathing, Feeding, Dressing Bathing Assistance: Limited assistance Feeding assistance: Limited assistance Dressing Assistance: Limited assistance     Functional Limitations Info             SPECIAL CARE  FACTORS FREQUENCY  PT (By licensed PT), OT (By licensed OT)     PT Frequency: 5 x week OT Frequency: 5 x week            Contractures Contractures Info: Present    Additional Factors Info  Code Status, Allergies Code Status Info: FULL Allergies Info: Gramineae Pollens and Other (medication that gives him a runny nose, but wife doesn't know what it is)           Current Medications (12/20/2023):  This is the current hospital active medication list Current Facility-Administered Medications  Medication Dose Route Frequency Provider Last Rate Last Admin   [START ON 12/21/2023] amoxicillin (AMOXIL) capsule 500 mg  500 mg Oral Q8H Sheril Dines, MD       apixaban Herby Lolling) tablet 5 mg  5 mg Oral BID Zhang, Ping T, MD   5 mg at 12/20/23 0809   atorvastatin  (LIPITOR) tablet 40 mg  40 mg Oral Daily Zhang, Ping T, MD   40 mg at 12/20/23 0809   clopidogrel (PLAVIX) tablet 75 mg  75 mg Oral Daily Antoniette Batty T, MD   75 mg at 12/20/23 0809   dextrose 50 % solution 0-50 mL  0-50 mL Intravenous PRN Buell Carmin, MD       donepezil  (ARICEPT ) tablet 10 mg  10 mg Oral Q1400 Antoniette Batty T, MD   10 mg at 12/20/23 1356   feeding supplement (ENSURE ENLIVE / ENSURE PLUS)  liquid 237 mL  237 mL Oral BID BM Antoniette Batty T, MD   237 mL at 12/20/23 1358   haloperidol lactate (HALDOL) injection 2 mg  2 mg Intramuscular Q6H PRN Sheril Dines, MD   2 mg at 12/20/23 0015   insulin aspart (novoLOG) injection 0-5 Units  0-5 Units Subcutaneous QHS Antoniette Batty T, MD   2 Units at 12/19/23 2119   insulin aspart (novoLOG) injection 0-9 Units  0-9 Units Subcutaneous TID WC Antoniette Batty T, MD   5 Units at 12/20/23 1237   insulin glargine-yfgn (SEMGLEE) injection 10 Units  10 Units Subcutaneous QPM Sheril Dines, MD       lisinopril  (ZESTRIL ) tablet 5 mg  5 mg Oral Daily Ayiku, Bernard, MD   5 mg at 12/20/23 1237   metoprolol succinate (TOPROL-XL) 24 hr tablet 12.5 mg  12.5 mg Oral Daily Zhang, Ping T, MD   12.5 mg at  12/20/23 0809   ondansetron  (ZOFRAN ) tablet 4 mg  4 mg Oral Q6H PRN [place], MD       Or   ondansetron  (ZOFRAN ) injection 4 mg  4 mg Intravenous Q6H PRN [place], MD         Discharge Medications: Please see discharge summary for a list of discharge medications.  Relevant Imaging Results:  Relevant Lab Results:   Additional Information [id number]  Elsie Halo, RN

## 2023-12-20 NOTE — Progress Notes (Signed)
 Occupational Therapy Treatment Patient Details Name: Luke Chen MRN: [id number] DOB: October 07, 1940 Today's Date: 12/20/2023   History of present illness Pt is an 83 y.o. 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 Workup for potential sepsis, mild DKA, AKI, UTI.   OT comments  Pt is supine in bed on arrival. Increased cueing to arouse d/t being given sedative early AM. No pain reported this session. Pt required Max A to get OOB this date d/t lethargy. Pt required increased time to become alert, but required increased assist this date for STS from EOB multiple trials-Mod A, verb cues to for hand placement. Soiled pull-up required full change with Max A to doff/don new pull up via seated lateral lean/stand from EOB. Able to sit at EOB with CGA progressing to SUP this date to drink full protein shake prior to returning to supine with Min A and left in L sidelying position with wife present and all needs in place. Pt will cont to require skilled acute OT services to maximize his safety and IND to return to PLOF.       If plan is discharge home, recommend the following:  Help with stairs or ramp for entrance;A lot of help with walking and/or transfers;A lot of help with bathing/dressing/bathroom   Equipment Recommendations  Other (comment) (defer to next venue)    Recommendations for Other Services      Precautions / Restrictions Precautions Precautions: Fall Recall of Precautions/Restrictions: Impaired Restrictions Weight Bearing Restrictions Per Provider Order: No       Mobility Bed Mobility Overal bed mobility: Needs Assistance Bed Mobility: Supine to Sit, Sit to Supine     Supine to sit: Mod assist, Max assist Sit to supine: Min assist   General bed mobility comments:  increased assist this date d/t receiving sedative early AM    Transfers Overall transfer level: Needs assistance Equipment used: Rolling walker (2 wheels) Transfers: Sit to/from Stand Sit to Stand: Mod assist           General transfer comment: x3 STS from EOB to RW     Balance Overall balance assessment: Needs assistance Sitting-balance support: Feet supported Sitting balance-Leahy Scale: Fair     Standing balance support: Bilateral upper extremity supported, During functional activity Standing balance-Leahy Scale: Poor Standing balance comment: min/mod and RW                           ADL either performed or assessed with clinical judgement   ADL Overall ADL's : Needs assistance/impaired                     Lower Body Dressing: Sitting/lateral leans;Maximal assistance Lower Body Dressing Details (indicate cue type and reason): doff/don new pull up after small BM and urine incont in pull up               General ADL Comments: unable to amb this date; increased assist needed to stand    Extremity/Trunk Assessment              Vision       Perception     Praxis     Communication Communication Factors Affecting Communication: Hearing impaired   Cognition Arousal: Lethargic, Alert   Cognition: History of  cognitive impairments                               Following commands: Impaired Following commands impaired: Follows one step commands inconsistently, Follows one step commands with increased time      Cueing   Cueing Techniques: Verbal cues, Gestural cues, Tactile cues  Exercises      Shoulder Instructions       General Comments      Pertinent Vitals/ Pain       Pain Assessment Pain Assessment: No/denies pain  Home Living                                          Prior Functioning/Environment              Frequency  Min 3X/week        Progress Toward Goals  OT  Goals(current goals can now be found in the care plan section)  Progress towards OT goals: Progressing toward goals  Acute Rehab OT Goals Patient Stated Goal: improve function OT Goal Formulation: With patient/family Time For Goal Achievement: 01/02/24 Potential to Achieve Goals: Fair  Plan      Co-evaluation                 AM-PAC OT "6 Clicks" Daily Activity     Outcome Measure   Help from another person eating meals?: None Help from another person taking care of personal grooming?: A Little Help from another person toileting, which includes using toliet, bedpan, or urinal?: A Lot Help from another person bathing (including washing, rinsing, drying)?: A Lot Help from another person to put on and taking off regular upper body clothing?: A Little Help from another person to put on and taking off regular lower body clothing?: A Lot 6 Click Score: 16    End of Session Equipment Utilized During Treatment: Gait belt;Rolling walker (2 wheels)  OT Visit Diagnosis: Other abnormalities of gait and mobility (R26.89);Unsteadiness on feet (R26.81);Muscle weakness (generalized) (M62.81)   Activity Tolerance Patient tolerated treatment well;Patient limited by lethargy   Patient Left with call bell/phone within reach;with family/visitor present;in bed;with bed alarm set   Nurse Communication Mobility status        Time: 6962-9528 OT Time Calculation (min): 23 min  Charges: OT General Charges $OT Visit: 1 Visit OT Treatments $Self Care/Home Management : 8-22 mins $Therapeutic Activity: 8-22 mins  Normajean Nash, OTR/L  12/20/23, 4:27 PM   Jamicia Haaland E Donte Kary 12/20/2023, 4:25 PM

## 2023-12-20 NOTE — Progress Notes (Addendum)
 Progress Note    Luke Chen  ZOX:[id number] DOB: 07-06-1941  DOA: 12/18/2023 PCP: Wilnette Haste., Theron Flavin, MD      Brief Narrative:    Medical records reviewed and are as summarized below:  Luke Chen is a 83 y.o. 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       Assessment/Plan:   Principal Problem:   AKI (acute kidney injury) (HCC) Active Problems:   AMS (altered mental status)   Impaired ambulation   UTI (urinary tract infection)   Sepsis (HCC)    Body mass index is 32.62 kg/m.  (Obesity)  Sepsis secondary to acute complicated UTI, nephrolithiasis: Urine culture showed Proteus mirabilis.  Discontinue IV ceftriaxone  after today's dose (third dose).  Start amoxicillin tomorrow to complete 7 days of treatment.   AKI: Improved.  Creatinine down from 1.74-1.16. Severe lactic acidosis: Improved (from >9-2.4)   Acute metabolic encephalopathy, underlying dementia: MRI negative for acute stroke. Continue supportive care.  IM Haldol as needed for agitation.   Type II DM with hyperglycemia, mild DKA on admission: Briefly required insulin drip.  Start Lantus 16 units daily because of persistent hyperglycemia.  Continue NovoLog. Metformin  and glipizide  on hold Hemoglobin A1c 10.1   Chronic HFrEF: Compensated.  Continue Toprol-XL.  Restart home lisinopril .  Aldactone on hold. History of LV thrombus: Continue Eliquis CAD with coronary stent to LCx in July 2022: Continue Plavix History of stroke   Debility: PT and OT recommended discharge to SNF.  Follow-up with TOC to assist with disposition.   Diet Order             Diet Carb Modified Fluid consistency: Thin  Diet effective now                             Consultants: None  Procedures: None    Medications:    [START ON 12/21/2023] amoxicillin  500 mg Oral Q8H   apixaban  5 mg Oral BID   atorvastatin   40 mg Oral Daily   clopidogrel  75 mg Oral Daily   donepezil   10 mg Oral Q1400   feeding supplement  237 mL Oral BID BM   insulin aspart  0-5 Units Subcutaneous QHS   insulin aspart  0-9 Units Subcutaneous TID WC   insulin glargine-yfgn  10 Units Subcutaneous Daily   lisinopril   5 mg Oral Daily   metoprolol succinate  12.5 mg Oral Daily   Continuous Infusions:     Anti-infectives (From admission, onward)    Start     Dose/Rate Route Frequency Ordered Stop   12/21/23 0600  amoxicillin (AMOXIL) capsule 500 mg        500 mg Oral Every 8 hours 12/20/23 0758 12/25/23 0559   12/19/23 1000  cefTRIAXone  (ROCEPHIN ) 1 g in sodium chloride  0.9 % 100 mL IVPB        1 g 200 mL/hr over 30 Minutes Intravenous Every 24 hours 12/18/23 1102 12/20/23 1034   12/18/23 0615  metroNIDAZOLE (FLAGYL) IVPB 500 mg        500 mg 100 mL/hr over 60 Minutes Intravenous  Once 12/18/23 0607 12/18/23 0740   12/18/23 0600  vancomycin (VANCOCIN) IVPB 1000 mg/200 mL premix  Placed in "Followed by" Linked Group   1,000 mg 200 mL/hr over 60 Minutes Intravenous  Once 12/18/23 0548 12/18/23 0740   12/18/23 0600  vancomycin (VANCOREADY) IVPB 1500 mg/300 mL       Placed in "Followed by" Linked Group   1,500 mg 150 mL/hr over 120 Minutes Intravenous  Once 12/18/23 0548 12/18/23 1205   12/18/23 0545  ceFEPIme (MAXIPIME) 2 g in sodium chloride  0.9 % 100 mL IVPB        2 g 200 mL/hr over 30 Minutes Intravenous  Once 12/18/23 0538 12/18/23 0629   12/18/23 0545  metroNIDAZOLE (FLAGYL) tablet 2,000 mg  Status:  Discontinued        2,000 mg Oral  Once 12/18/23 5409 12/18/23 0607              Family Communication/Anticipated D/C date and plan/Code Status   DVT prophylaxis:  apixaban (ELIQUIS) tablet 5 mg     Code Status: Full  Code  Family Communication: Called his wife's phone [phone]) today at 3:30 PM but there was no response. Disposition Plan: Plan to discharge to SNF   Status is: Inpatient Remains inpatient appropriate because: Acute UTI       Subjective:   Interval events noted.  He cannot provide any history because of confusion and underlying dementia  Objective:    Vitals:   12/19/23 1620 12/19/23 2003 12/20/23 0447 12/20/23 0756  BP: 116/64 112/68 (!) 119/55 122/64  Pulse: 87 85 64 83  Resp: 16 16 18 20   Temp: (!) 97.2 F (36.2 C) 98.2 F (36.8 C) 97.8 F (36.6 C) 97.6 F (36.4 C)  TempSrc:  Oral Oral   SpO2: 100% 98% 99% 98%  Weight:      Height:       No data found.   Intake/Output Summary (Last 24 hours) at 12/20/2023 1530 Last data filed at 12/20/2023 1300 Gross per 24 hour  Intake 480 ml  Output --  Net 480 ml   Filed Weights   12/18/23 0545  Weight: 100.2 kg    Exam:  GEN: NAD SKIN: Warm and dry EYES: No pallor or icterus ENT: MMM CV: RRR PULM: CTA B ABD: soft, ND, NT, +BS CNS: AAO x 1 (person), non focal EXT: No edema or tenderness       Data Reviewed:   I have personally reviewed following labs and imaging studies:  Labs: Labs show the following:   Basic Metabolic Panel: Recent Labs  Lab 12/18/23 1322 12/18/23 1557 12/18/23 2102 12/19/23 0038 12/19/23 0454  NA 139 142 142 145 141  K 3.6 3.7 3.8 4.2 3.9  CL 110 111 111 115* 111  CO2 21* 21* 22 18* 23  GLUCOSE 173* 158* 129* 124* 133*  BUN 26* 27* 22 21 18   CREATININE 1.41* 1.19 1.20 1.08 1.16  CALCIUM  7.9* 8.3* 8.3* 8.2* 8.1*   GFR Estimated Creatinine Clearance: 59.3 mL/min (by C-G formula based on SCr of 1.16 mg/dL). Liver Function Tests: Recent Labs  Lab 12/18/23 0424  AST 35  ALT 17  ALKPHOS 62  BILITOT 0.6  PROT 7.5  ALBUMIN 3.8   Recent Labs  Lab 12/18/23 0424  LIPASE 45   No results for input(s): "AMMONIA" in the last 168 hours. Coagulation  profile Recent Labs  Lab 12/18/23 0424  INR 1.4*    CBC: Recent Labs  Lab 12/18/23 0424 12/19/23 0454  WBC 10.2 6.6  NEUTROABS 8.0*  --   HGB 13.2 11.3*  HCT  42.5 35.3*  MCV 95.7 93.9  PLT 161 114*   Cardiac Enzymes: Recent Labs  Lab 12/18/23 0424  CKTOTAL 142   BNP (last 3 results) No results for input(s): "PROBNP" in the last 8760 hours. CBG: Recent Labs  Lab 12/19/23 1154 12/19/23 1717 12/19/23 2004 12/20/23 0758 12/20/23 1205  GLUCAP 231* 223* 247* 234* 252*   D-Dimer: No results for input(s): "DDIMER" in the last 72 hours. Hgb A1c: Recent Labs    12/18/23 1322  HGBA1C 10.1*   Lipid Profile: No results for input(s): "CHOL", "HDL", "LDLCALC", "TRIG", "CHOLHDL", "LDLDIRECT" in the last 72 hours. Thyroid  function studies: No results for input(s): "TSH", "T4TOTAL", "T3FREE", "THYROIDAB" in the last 72 hours.  Invalid input(s): "FREET3" Anemia work up: No results for input(s): "VITAMINB12", "FOLATE", "FERRITIN", "TIBC", "IRON", "RETICCTPCT" in the last 72 hours. Sepsis Labs: Recent Labs  Lab 12/18/23 0424 12/18/23 0700 12/18/23 1322 12/18/23 1557 12/19/23 0454  WBC 10.2  --   --   --  6.6  LATICACIDVEN >9.0* 6.5* 2.7* 2.4*  --     Microbiology Recent Results (from the past 240 hours)  Resp panel by RT-PCR (RSV, Flu A&B, Covid) Anterior Nasal Swab     Status: None   Collection Time: 12/18/23  4:24 AM   Specimen: Anterior Nasal Swab  Result Value Ref Range Status   SARS Coronavirus 2 by RT PCR NEGATIVE NEGATIVE Final    Comment: (NOTE) SARS-CoV-2 target nucleic acids are NOT DETECTED.  The SARS-CoV-2 RNA is generally detectable in upper respiratory specimens during the acute phase of infection. The lowest concentration of SARS-CoV-2 viral copies this assay can detect is 138 copies/mL. A negative result does not preclude SARS-Cov-2 infection and should not be used as the sole basis for treatment or other patient management decisions. A  negative result may occur with  improper specimen collection/handling, submission of specimen other than nasopharyngeal swab, presence of viral mutation(s) within the areas targeted by this assay, and inadequate number of viral copies(<138 copies/mL). A negative result must be combined with clinical observations, patient history, and epidemiological information. The expected result is Negative.  Fact Sheet for Patients:  BloggerCourse.com  Fact Sheet for Healthcare Providers:  SeriousBroker.it  This test is no t yet approved or cleared by the [place]  FDA and  has been authorized for detection and/or diagnosis of SARS-CoV-2 by FDA under an Emergency Use Authorization (EUA). This EUA will remain  in effect (meaning this test can be used) for the duration of the COVID-19 declaration under Section 564(b)(1) of the Act, 21 U.S.C.section 360bbb-3(b)(1), unless the authorization is terminated  or revoked sooner.       Influenza A by PCR NEGATIVE NEGATIVE Final   Influenza B by PCR NEGATIVE NEGATIVE Final    Comment: (NOTE) The Xpert Xpress SARS-CoV-2/FLU/RSV plus assay is intended as an aid in the diagnosis of influenza from Nasopharyngeal swab specimens and should not be used as a sole basis for treatment. Nasal washings and aspirates are unacceptable for Xpert Xpress SARS-CoV-2/FLU/RSV testing.  Fact Sheet for Patients: BloggerCourse.com  Fact Sheet for Healthcare Providers: SeriousBroker.it  This test is not yet approved or cleared by the [place]  FDA and has been authorized for detection and/or diagnosis of SARS-CoV-2 by FDA under an Emergency Use Authorization (EUA). This EUA will remain in effect (meaning this test can be used) for the duration of the COVID-19 declaration under Section 564(b)(1) of the Act, 21 U.S.C. section 360bbb-3(b)(1), unless the authorization  is terminated or  revoked.     Resp Syncytial Virus by PCR NEGATIVE NEGATIVE Final    Comment: (NOTE) Fact Sheet for Patients: BloggerCourse.com  Fact Sheet for Healthcare Providers: SeriousBroker.it  This test is not yet approved or cleared by the [place]  FDA and has been authorized for detection and/or diagnosis of SARS-CoV-2 by FDA under an Emergency Use Authorization (EUA). This EUA will remain in effect (meaning this test can be used) for the duration of the COVID-19 declaration under Section 564(b)(1) of the Act, 21 U.S.C. section 360bbb-3(b)(1), unless the authorization is terminated or revoked.  Performed at [facility], [address] [place], [place] [address]   Blood Culture (routine x 2)     Status: None (Preliminary result)   Collection Time: 12/18/23  6:31 AM   Specimen: BLOOD RIGHT ARM  Result Value Ref Range Status   Specimen Description BLOOD RIGHT ARM  Final   Special Requests   Final    BOTTLES DRAWN AEROBIC AND ANAEROBIC Blood Culture results may not be optimal due to an inadequate volume of blood received in culture bottles   Culture   Final    NO GROWTH 2 DAYS Performed at [facility], [address] [place], [place] [address]    Report Status PENDING  Incomplete  Blood Culture (routine x 2)     Status: None (Preliminary result)   Collection Time: 12/18/23  6:31 AM   Specimen: BLOOD LEFT ARM  Result Value Ref Range Status   Specimen Description BLOOD LEFT ARM  Final   Special Requests   Final    BOTTLES DRAWN AEROBIC AND ANAEROBIC Blood Culture results may not be optimal due to an inadequate volume of blood received in culture bottles   Culture   Final    NO GROWTH 2 DAYS Performed at [facility], [address], [place], [place] [address]    Report Status PENDING  Incomplete  Urine Culture     Status: Abnormal   Collection Time: 12/18/23  6:45 AM   Specimen:  Urine, Random  Result Value Ref Range Status   Specimen Description   Final    URINE, RANDOM Performed at [facility], [address] [place], [place] [address]    Special Requests   Final    NONE Reflexed from [id number] Performed at [facility], [address] [place], [place] [address]    Culture >=100,000 COLONIES/mL PROTEUS MIRABILIS (A)  Final   Report Status 12/20/2023 FINAL  Final   Organism ID, Bacteria PROTEUS MIRABILIS (A)  Final      Susceptibility   Proteus mirabilis - MIC*    AMPICILLIN <=2 SENSITIVE Sensitive     CEFAZOLIN <=4 SENSITIVE Sensitive     CEFEPIME <=0.12 SENSITIVE Sensitive     CEFTRIAXONE  <=0.25 SENSITIVE Sensitive     CIPROFLOXACIN <=0.25 SENSITIVE Sensitive     GENTAMICIN <=1 SENSITIVE Sensitive     IMIPENEM 4 SENSITIVE Sensitive     NITROFURANTOIN 128 RESISTANT Resistant     TRIMETH/SULFA <=20 SENSITIVE Sensitive     AMPICILLIN/SULBACTAM <=2 SENSITIVE Sensitive     PIP/TAZO <=4 SENSITIVE Sensitive ug/mL    * >=100,000 COLONIES/mL PROTEUS MIRABILIS    Procedures and diagnostic studies:  DG Chest 1 View Result Date: 12/19/2023 CLINICAL DATA:  CHF. EXAM: CHEST  1 VIEW COMPARISON:  12/18/2023 FINDINGS: Heart size is normal. Aortic atherosclerosis. Lung volumes are decreased. Mild platelike atelectasis in the right base. No pleural fluid or interstitial edema. No airspace  consolidation. Visualized osseous structures appear intact. IMPRESSION: Low lung volumes and right base atelectasis. No pleural fluid or edema. Electronically Signed   By: Kimberley Penman M.D.   On: 12/19/2023 07:14               LOS: 2 days   Celese Banner  [facility]   Pager on [email or web address]. If 7PM-7AM, please contact night-coverage at [email or web address]     12/20/2023, 3:30 PM

## 2023-12-20 NOTE — Plan of Care (Signed)
  Problem: Education: Goal: Ability to describe self-care measures that may prevent or decrease complications (Diabetes Survival Skills Education) will improve Outcome: Progressing Goal: Individualized Educational Video(s) Outcome: Progressing   Problem: Coping: Goal: Ability to adjust to condition or change in health will improve Outcome: Progressing   Problem: Fluid Volume: Goal: Ability to maintain a balanced intake and output will improve Outcome: Progressing   Problem: Health Behavior/Discharge Planning: Goal: Ability to identify and utilize available resources and services will improve Outcome: Progressing Goal: Ability to manage health-related needs will improve Outcome: Progressing   Problem: Nutritional: Goal: Maintenance of adequate nutrition will improve Outcome: Progressing Goal: Progress toward achieving an optimal weight will improve Outcome: Progressing   Problem: Skin Integrity: Goal: Risk for impaired skin integrity will decrease Outcome: Progressing

## 2023-12-21 DIAGNOSIS — N179 Acute kidney failure, unspecified: Secondary | ICD-10-CM | POA: Diagnosis not present

## 2023-12-21 LAB — BASIC METABOLIC PANEL WITH GFR
Anion gap: 10 (ref 5–15)
BUN: 20 mg/dL (ref 8–23)
CO2: 24 mmol/L (ref 22–32)
Calcium: 8.4 mg/dL — ABNORMAL LOW (ref 8.9–10.3)
Chloride: 107 mmol/L (ref 98–111)
Creatinine, Ser: 0.88 mg/dL (ref 0.61–1.24)
GFR, Estimated: >60 mL/min (ref >60)
Glucose, Bld: 231 mg/dL — ABNORMAL HIGH (ref 70–99)
Potassium: 3.6 mmol/L (ref 3.5–5.1)
Sodium: 141 mmol/L (ref 135–145)

## 2023-12-21 LAB — GLUCOSE, CAPILLARY
Glucose-Capillary: 199 mg/dL — ABNORMAL HIGH (ref 70–99)
Glucose-Capillary: 241 mg/dL — ABNORMAL HIGH (ref 70–99)
Glucose-Capillary: 289 mg/dL — ABNORMAL HIGH (ref 70–99)
Glucose-Capillary: 270 mg/dL — ABNORMAL HIGH (ref 70–99)

## 2023-12-21 LAB — LACTIC ACID, PLASMA: Lactic Acid, Venous: 1.5 mmol/L (ref 0.5–1.9)

## 2023-12-21 MED ORDER — SPIRONOLACTONE 25 MG PO TABS
25.0000 mg | ORAL_TABLET | Freq: Every day | ORAL | Status: DC
  Administered 2023-12-21 – 2023-12-23 (×2): 25 mg via ORAL
  Filled 2023-12-21 (×3): qty 1

## 2023-12-21 MED ORDER — HALOPERIDOL 0.5 MG PO TABS
0.5000 mg | ORAL_TABLET | Freq: Two times a day (BID) | ORAL | Status: DC
  Administered 2023-12-21 – 2023-12-22 (×2): 0.5 mg via ORAL
  Filled 2023-12-21 (×4): qty 1

## 2023-12-21 NOTE — Progress Notes (Signed)
 Progress Note    Luke Chen  ZOX:[id number] DOB: Jan 31, 1941  DOA: 12/18/2023 PCP: Wilnette Haste., Theron Flavin, MD      Brief Narrative:    Medical records reviewed and are as summarized below:  Luke Chen is a 83 y.o. 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       Assessment/Plan:   Principal Problem:   AKI (acute kidney injury) (HCC) Active Problems:   AMS (altered mental status)   Impaired ambulation   UTI (urinary tract infection)   Sepsis (HCC)    Body mass index is 32.62 kg/m.  (Obesity)  Sepsis secondary to acute complicated UTI, nephrolithiasis: Urine culture showed Proteus mirabilis.  Completed 3 days of IV ceftriaxone  on 12/20/2023.  Continue amoxicillin  to complete 7 days of treatment.     AKI: Improved.  Creatinine down from 1.74-1.16. Severe lactic acidosis: Resolved (from >9-2.4-1.5)   Acute metabolic encephalopathy, underlying dementia: MRI negative for acute stroke. Continue supportive care.  IM Haldol  as needed for agitation.   Type II DM with hyperglycemia, ?mild DKA on admission: Briefly required insulin  drip.  Continue Lantus  10 units every evening and NovoLog  as needed. Metformin  and glipizide  on hold Hemoglobin A1c 10.1   Chronic HFrEF: Compensated.  Continue Toprol -XL and lisinopril .  Restart Aldactone. History of LV thrombus: Continue Eliquis  CAD with coronary stent to LCx in July 2022: Continue Plavix  History of stroke   Debility: PT and OT recommended discharge to SNF.  Follow-up with TOC to assist with disposition.   Diet Order             Diet Carb Modified Fluid consistency: Thin  Diet effective now                             Consultants: None  Procedures: None    Medications:    amoxicillin   500 mg Oral Q8H   apixaban   5 mg Oral BID   atorvastatin   40 mg Oral Daily   clopidogrel   75 mg Oral Daily   donepezil   10 mg Oral Q1400   feeding supplement  237 mL Oral BID BM   insulin  aspart  0-5 Units Subcutaneous QHS   insulin  aspart  0-9 Units Subcutaneous TID WC   insulin  glargine-yfgn  10 Units Subcutaneous QPM   lisinopril   5 mg Oral Daily   metoprolol  succinate  12.5 mg Oral Daily   Continuous Infusions:     Anti-infectives (From admission, onward)    Start     Dose/Rate Route Frequency Ordered Stop   12/21/23 0600  amoxicillin  (AMOXIL ) capsule 500 mg        500 mg Oral Every 8 hours 12/20/23 0758 12/25/23 0759   12/19/23 1000  cefTRIAXone  (ROCEPHIN ) 1 g in sodium chloride  0.9 % 100 mL IVPB        1 g 200 mL/hr over 30 Minutes Intravenous Every 24 hours 12/18/23 1102 12/21/23 0801   12/18/23 0615  metroNIDAZOLE  (FLAGYL ) IVPB 500 mg        500 mg 100 mL/hr over 60 Minutes Intravenous  Once 12/18/23 0607 12/18/23 0740   12/18/23 0600  vancomycin  (VANCOCIN ) IVPB 1000 mg/200 mL premix       Placed in "Followed  by" Linked Group   1,000 mg 200 mL/hr over 60 Minutes Intravenous  Once 12/18/23 0548 12/18/23 0740   12/18/23 0600  vancomycin  (VANCOREADY) IVPB 1500 mg/300 mL       Placed in "Followed by" Linked Group   1,500 mg 150 mL/hr over 120 Minutes Intravenous  Once 12/18/23 0548 12/18/23 1205   12/18/23 0545  ceFEPIme  (MAXIPIME ) 2 g in sodium chloride  0.9 % 100 mL IVPB        2 g 200 mL/hr over 30 Minutes Intravenous  Once 12/18/23 0538 12/18/23 0629   12/18/23 0545  metroNIDAZOLE  (FLAGYL ) tablet 2,000 mg  Status:  Discontinued        2,000 mg Oral  Once 12/18/23 0538 12/18/23 0607              Family Communication/Anticipated D/C date and plan/Code Status   DVT prophylaxis:  apixaban  (ELIQUIS ) tablet 5 mg     Code Status: Full Code  Family  Communication: None  Disposition Plan: Plan to discharge to SNF   Status is: Inpatient Remains inpatient appropriate because: Awaiting placement to SNF       Subjective:   Interval events noted.  He has no complaints.  Limited history because of dementia.  Objective:    Vitals:   12/21/23 0315 12/21/23 0318 12/21/23 0723 12/21/23 1546  BP: 108/64 (!) 108/47 (!) 130/59 137/75  Pulse: (!) 39 66 72 69  Resp: 16 16 16 17   Temp: 97.8 F (36.6 C) 97.9 F (36.6 C) 97.6 F (36.4 C) 97.8 F (36.6 C)  TempSrc:    Oral  SpO2: 96% 96% 97% 99%  Weight:      Height:       No data found.   Intake/Output Summary (Last 24 hours) at 12/21/2023 1605 Last data filed at 12/21/2023 0900 Gross per 24 hour  Intake 220 ml  Output --  Net 220 ml   Filed Weights   12/18/23 0545  Weight: 100.2 kg    Exam:  GEN: NAD SKIN: Warm and dry EYES: Anicteric ENT: MMM CV: RRR PULM: CTA B ABD: soft, ND, NT, +BS CNS: AAO x 1 (person), non focal EXT: No edema or tenderness PSYCH: Calm and cooperative.  Poor insight and judgment   Pressure Injury 12/21/23 Buttocks Right;Left Stage 1 -  Intact skin with non-blanchable redness of a localized area usually over a bony prominence. (Active)  12/21/23 0730  Location: Buttocks  Location Orientation: Right;Left  Staging: Stage 1 -  Intact skin with non-blanchable redness of a localized area usually over a bony prominence.  Wound Description (Comments):   Present on Admission:   Dressing Type Foam - Lift dressing to assess site every shift 12/21/23 0900     Data Reviewed:   I have personally reviewed following labs and imaging studies:  Labs: Labs show the following:   Basic Metabolic Panel: Recent Labs  Lab 12/18/23 1557 12/18/23 2102 12/19/23 0038 12/19/23 0454 12/21/23 0443  NA 142 142 145 141 141  K 3.7 3.8 4.2 3.9 3.6  CL 111 111 115* 111 107  CO2 21* 22 18* 23 24  GLUCOSE 158* 129* 124* 133* 231*  BUN 27* 22 21 18 20    CREATININE 1.19 1.20 1.08 1.16 0.88  CALCIUM  8.3* 8.3* 8.2* 8.1* 8.4*   GFR Estimated Creatinine Clearance: 78.1 mL/min (by C-G formula based on SCr of 0.88 mg/dL). Liver Function Tests: Recent Labs  Lab 12/18/23 0424  AST 35  ALT 17  ALKPHOS 62  BILITOT 0.6  PROT 7.5  ALBUMIN 3.8   Recent Labs  Lab 12/18/23 0424  LIPASE 45   No results for input(s): "AMMONIA" in the last 168 hours. Coagulation profile Recent Labs  Lab 12/18/23 0424  INR 1.4*    CBC: Recent Labs  Lab 12/18/23 0424 12/19/23 0454  WBC 10.2 6.6  NEUTROABS 8.0*  --   HGB 13.2 11.3*  HCT 42.5 35.3*  MCV 95.7 93.9  PLT 161 114*   Cardiac Enzymes: Recent Labs  Lab 12/18/23 0424  CKTOTAL 142   BNP (last 3 results) No results for input(s): "PROBNP" in the last 8760 hours. CBG: Recent Labs  Lab 12/20/23 1205 12/20/23 1612 12/20/23 1956 12/21/23 0720 12/21/23 1143  GLUCAP 252* 251* 228* 199* 241*   D-Dimer: No results for input(s): "DDIMER" in the last 72 hours. Hgb A1c: No results for input(s): "HGBA1C" in the last 72 hours.  Lipid Profile: No results for input(s): "CHOL", "HDL", "LDLCALC", "TRIG", "CHOLHDL", "LDLDIRECT" in the last 72 hours. Thyroid  function studies: No results for input(s): "TSH", "T4TOTAL", "T3FREE", "THYROIDAB" in the last 72 hours.  Invalid input(s): "FREET3" Anemia work up: No results for input(s): "VITAMINB12", "FOLATE", "FERRITIN", "TIBC", "IRON", "RETICCTPCT" in the last 72 hours. Sepsis Labs: Recent Labs  Lab 12/18/23 0424 12/18/23 0700 12/18/23 1322 12/18/23 1557 12/19/23 0454 12/21/23 0443  WBC 10.2  --   --   --  6.6  --   LATICACIDVEN >9.0* 6.5* 2.7* 2.4*  --  1.5    Microbiology Recent Results (from the past 240 hours)  Resp panel by RT-PCR (RSV, Flu A&B, Covid) Anterior Nasal Swab     Status: None   Collection Time: 12/18/23  4:24 AM   Specimen: Anterior Nasal Swab  Result Value Ref Range Status   SARS Coronavirus 2 by RT PCR NEGATIVE  NEGATIVE Final    Comment: (NOTE) SARS-CoV-2 target nucleic acids are NOT DETECTED.  The SARS-CoV-2 RNA is generally detectable in upper respiratory specimens during the acute phase of infection. The lowest concentration of SARS-CoV-2 viral copies this assay can detect is 138 copies/mL. A negative result does not preclude SARS-Cov-2 infection and should not be used as the sole basis for treatment or other patient management decisions. A negative result may occur with  improper specimen collection/handling, submission of specimen other than nasopharyngeal swab, presence of viral mutation(s) within the areas targeted by this assay, and inadequate number of viral copies(<138 copies/mL). A negative result must be combined with clinical observations, patient history, and epidemiological information. The expected result is Negative.  Fact Sheet for Patients:  BloggerCourse.com  Fact Sheet for Healthcare Providers:  SeriousBroker.it  This test is no t yet approved or cleared by the [place]  FDA and  has been authorized for detection and/or diagnosis of SARS-CoV-2 by FDA under an Emergency Use Authorization (EUA). This EUA will remain  in effect (meaning this test can be used) for the duration of the COVID-19 declaration under Section 564(b)(1) of the Act, 21 U.S.C.section 360bbb-3(b)(1), unless the authorization is terminated  or revoked sooner.       Influenza A by PCR NEGATIVE NEGATIVE Final   Influenza B by PCR NEGATIVE NEGATIVE Final    Comment: (NOTE) The Xpert Xpress SARS-CoV-2/FLU/RSV plus assay is intended as an aid in the diagnosis of influenza from Nasopharyngeal swab specimens and should not be used as a sole basis for treatment. Nasal washings and aspirates are unacceptable for Xpert Xpress SARS-CoV-2/FLU/RSV testing.  Fact Sheet for Patients: BloggerCourse.com  Fact Sheet for Healthcare  Providers: SeriousBroker.it  This test is not yet approved or cleared by the [place]  FDA and has been authorized for detection and/or diagnosis of SARS-CoV-2 by FDA under an Emergency Use Authorization (EUA). This EUA will remain in effect (meaning this test can be used) for the duration of the COVID-19 declaration under Section 564(b)(1) of the Act, 21 U.S.C. section 360bbb-3(b)(1), unless the authorization is terminated or revoked.     Resp Syncytial Virus by PCR NEGATIVE NEGATIVE Final    Comment: (NOTE) Fact Sheet for Patients: BloggerCourse.com  Fact Sheet for Healthcare Providers: SeriousBroker.it  This test is not yet approved or cleared by the [place]  FDA and has been authorized for detection and/or diagnosis of SARS-CoV-2 by FDA under an Emergency Use Authorization (EUA). This EUA will remain in effect (meaning this test can be used) for the duration of the COVID-19 declaration under Section 564(b)(1) of the Act, 21 U.S.C. section 360bbb-3(b)(1), unless the authorization is terminated or revoked.  Performed at [facility], [address], [place], [place] [address]   Blood Culture (routine x 2)     Status: None (Preliminary result)   Collection Time: 12/18/23  6:31 AM   Specimen: BLOOD RIGHT ARM  Result Value Ref Range Status   Specimen Description BLOOD RIGHT ARM  Final   Special Requests   Final    BOTTLES DRAWN AEROBIC AND ANAEROBIC Blood Culture results may not be optimal due to an inadequate volume of blood received in culture bottles   Culture   Final    NO GROWTH 3 DAYS Performed at [facility], [address], [place], [place] [address]    Report Status PENDING  Incomplete  Blood Culture (routine x 2)     Status: None (Preliminary result)   Collection Time: 12/18/23  6:31 AM   Specimen: BLOOD LEFT ARM  Result Value Ref Range Status   Specimen  Description BLOOD LEFT ARM  Final   Special Requests   Final    BOTTLES DRAWN AEROBIC AND ANAEROBIC Blood Culture results may not be optimal due to an inadequate volume of blood received in culture bottles   Culture   Final    NO GROWTH 3 DAYS Performed at [facility], [address] [place], [place] [address]    Report Status PENDING  Incomplete  Urine Culture     Status: Abnormal   Collection Time: 12/18/23  6:45 AM   Specimen: Urine, Random  Result Value Ref Range Status   Specimen Description   Final    URINE, RANDOM Performed at [facility], [address], [place], [place] [address]    Special Requests   Final    NONE Reflexed from [id number] Performed at [facility], [address] [place], [place] [address]    Culture >=100,000 COLONIES/mL PROTEUS MIRABILIS (A)  Final   Report Status 12/20/2023 FINAL  Final   Organism ID, Bacteria PROTEUS MIRABILIS (A)  Final      Susceptibility   Proteus mirabilis - MIC*    AMPICILLIN <=2 SENSITIVE Sensitive     CEFAZOLIN <=4 SENSITIVE Sensitive     CEFEPIME  <=0.12 SENSITIVE Sensitive     CEFTRIAXONE  <=0.25 SENSITIVE Sensitive     CIPROFLOXACIN <=0.25 SENSITIVE Sensitive     GENTAMICIN <=1 SENSITIVE Sensitive     IMIPENEM 4 SENSITIVE Sensitive     NITROFURANTOIN 128 RESISTANT Resistant     TRIMETH/SULFA <=20 SENSITIVE Sensitive  AMPICILLIN/SULBACTAM <=2 SENSITIVE Sensitive     PIP/TAZO <=4 SENSITIVE Sensitive ug/mL    * >=100,000 COLONIES/mL PROTEUS MIRABILIS    Procedures and diagnostic studies:  No results found.              LOS: 3 days   Shenekia Riess  [facility]   Pager on [email or web address]. If 7PM-7AM, please contact night-coverage at [email or web address]     12/21/2023, 4:05 PM

## 2023-12-21 NOTE — Progress Notes (Signed)
 Physical Therapy Treatment Patient Details Name: Luke Chen MRN: [id number] DOB: [birth year] Today's Date: 12/21/2023   History of Present Illness Pt is an 83 y.o. 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 Workup for potential sepsis, mild DKA, AKI, UTI.    PT Comments  Patient alert, oriented  to self only, denied pain. He was able to perform bed mobility from elevated HOB, supervision and use of rails, extra time. Sit <> Stand from EOB CGA with RW and then minA for steadying, and minA needed from standard recliner. Pt also with decreased safety awareness and coordination of RW with poor carryover of education. He ambulated ~14ft with RW and CGA-minA as well. Up in chair with needs in reach. The patient would benefit from further skilled PT intervention to continue to progress towards goals.     If plan is discharge home, recommend the following: A little help with walking and/or transfers;A little help with bathing/dressing/bathroom;Assistance with cooking/housework;Supervision due to cognitive status;Assist for transportation;Help with stairs or ramp for entrance;Direct supervision/assist for medications management   Can travel by private vehicle     Yes  Equipment Recommendations  Other (comment) (TBD)    Recommendations for Other Services       Precautions / Restrictions Precautions Precautions: Fall Recall of Precautions/Restrictions: Impaired Restrictions Weight Bearing Restrictions Per Provider Order: No     Mobility  Bed Mobility Overal bed mobility: Needs Assistance Bed Mobility: Supine to Sit     Supine to sit: HOB elevated, Used rails Sit to supine: Contact guard assist        Transfers Overall transfer level: Needs assistance Equipment used:  Rolling walker (2 wheels) Transfers: Sit to/from Stand Sit to Stand: Min assist, Contact guard assist           General transfer comment: from standard chair minA, EOB CGA with extra time, and then steadying assist once in standing    Ambulation/Gait Ambulation/Gait assistance: Min assist, Contact guard assist Gait Distance (Feet): 70 Feet           General Gait Details: primarily minA for ambulation for RW management, navigating obstacles, but improved ability to manuever RW with less help and gait velocity compared to last PT session   Stairs             Wheelchair Mobility     Tilt Bed    Modified Rankin (Stroke Patients Only)       Balance Overall balance assessment: Needs assistance Sitting-balance support: Feet supported Sitting balance-Leahy Scale: Fair     Standing balance support: Bilateral upper extremity supported, During functional activity Standing balance-Leahy Scale: Poor                              Communication Communication Factors Affecting Communication: Hearing impaired  Cognition Arousal: Alert Behavior During Therapy: Flat affect                           PT - Cognition Comments: pt oriented to self only Following commands: Intact Following commands impaired: Only follows one step commands consistently    Cueing    Exercises      General Comments        Pertinent Vitals/Pain Pain  Assessment Pain Assessment: No/denies pain    Home Living                          Prior Function            PT Goals (current goals can now be found in the care plan section) Progress towards PT goals: Progressing toward goals    Frequency    Min 3X/week      PT Plan      Co-evaluation              AM-PAC PT "6 Clicks" Mobility   Outcome Measure  Help needed turning from your back to your side while in a flat bed without using bedrails?: A Little Help needed moving from lying on  your back to sitting on the side of a flat bed without using bedrails?: A Little Help needed moving to and from a bed to a chair (including a wheelchair)?: A Little Help needed standing up from a chair using your arms (e.g., wheelchair or bedside chair)?: A Little Help needed to walk in hospital room?: A Little Help needed climbing 3-5 steps with a railing? : A Little 6 Click Score: 18    End of Session Equipment Utilized During Treatment: Gait belt Activity Tolerance: Patient tolerated treatment well Patient left: with call bell/phone within reach;in chair;with chair alarm set Nurse Communication: Mobility status PT Visit Diagnosis: Other abnormalities of gait and mobility (R26.89);Difficulty in walking, not elsewhere classified (R26.2);Muscle weakness (generalized) (M62.81)     Time: 6578-4696 PT Time Calculation (min) (ACUTE ONLY): 13 min  Charges:    $Therapeutic Activity: 8-22 mins PT General Charges $$ ACUTE PT VISIT: 1 Visit                     Darien Eden PT, DPT 11:49 AM,12/21/23

## 2023-12-21 NOTE — Plan of Care (Signed)

## 2023-12-21 NOTE — Inpatient Diabetes Management (Signed)
 Inpatient Diabetes Program Recommendations  AACE/ADA: New Consensus Statement on Inpatient Glycemic Control (2015)  Target Ranges:  Prepandial:   less than 140 mg/dL      Peak postprandial:   less than 180 mg/dL (1-2 hours)      Critically ill patients:  140 - 180 mg/dL    Latest Reference Range & Units 12/20/23 07:58 12/20/23 12:05 12/20/23 16:12 12/20/23 19:56  Glucose-Capillary 70 - 99 mg/dL 161 (H)  3 units Novolog   252 (H)  5 units Novolog   251 (H)  5 units Novolog    10 units Semglee  @1826  228 (H)  2 units Novolog    (H): Data is abnormally high  Latest Reference Range & Units 12/21/23 07:20 12/21/23 11:43  Glucose-Capillary 70 - 99 mg/dL 096 (H)  2 units Novolog   241 (H)  (H): Data is abnormally high   Home Meds: Amaryl  2 mg daily                       Metformin  1000 mg BID     Current Orders: Novolog  Sensitive Correction Scale/ SSI (0-9 units) TID AC + HS     Semglee  10 units QPM         MD- Please consider:  1. Increase the Semglee  to 12 units QPM  2. Start Novolog  3 units TID with meals for meal coverage HOLD if pt NPO HOLD if pt eats <50% meals    --Will follow patient during hospitalization--  Langston Pippins RN, MSN, CDCES Diabetes Coordinator Inpatient Glycemic Control Team Team Pager: [phone] (8a-5p)

## 2023-12-22 DIAGNOSIS — N179 Acute kidney failure, unspecified: Secondary | ICD-10-CM | POA: Diagnosis not present

## 2023-12-22 LAB — GLUCOSE, CAPILLARY
Glucose-Capillary: 134 mg/dL — ABNORMAL HIGH (ref 70–99)
Glucose-Capillary: 290 mg/dL — ABNORMAL HIGH (ref 70–99)
Glucose-Capillary: 275 mg/dL — ABNORMAL HIGH (ref 70–99)
Glucose-Capillary: 314 mg/dL — ABNORMAL HIGH (ref 70–99)
Glucose-Capillary: 282 mg/dL — ABNORMAL HIGH (ref 70–99)

## 2023-12-22 NOTE — Progress Notes (Signed)
 Progress Note    Luke Chen  MVH:[id number] DOB: February 27, 1941  DOA: 12/18/2023 PCP: Wilnette Haste., Theron Flavin, MD      Brief Narrative:    Medical records reviewed and are as summarized below:  Luke Chen is a 83 y.o. 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       Assessment/Plan:   Principal Problem:   AKI (acute kidney injury) (HCC) Active Problems:   AMS (altered mental status)   Impaired ambulation   UTI (urinary tract infection)   Sepsis (HCC)    Body mass index is 32.62 kg/m.  (Obesity)  Sepsis secondary to acute complicated UTI, nephrolithiasis: Urine culture showed Proteus mirabilis.  Completed 3 days of IV ceftriaxone  on 12/20/2023.  Continue amoxicillin  through 12/24/2023 to complete 7 days of treatment.     AKI: Improved.  Creatinine down from 1.74-1.16. Severe lactic acidosis: Resolved (from >9-2.4-1.5)   Acute metabolic encephalopathy, underlying dementia: MRI negative for acute stroke. Continue supportive care.  IM Haldol  as needed for agitation.   Type II DM with hyperglycemia, ?mild DKA on admission: Briefly required insulin  drip.  Continue Lantus  10 units every evening and NovoLog  as needed. Metformin  and glipizide  on hold Hemoglobin A1c 10.1   Chronic HFrEF: Compensated.  Continue Toprol -XL, Aldactone and lisinopril  if BP allows.  BP has been soft at times. History of LV thrombus: Continue Eliquis  CAD with coronary stent to LCx in July 2022: Continue Plavix  History of stroke   Debility: PT and OT recommended discharge to SNF.  Follow-up with TOC to assist with disposition.   Diet Order             Diet Carb Modified Fluid consistency: Thin  Diet effective now                             Consultants: None  Procedures: None    Medications:    amoxicillin   500 mg Oral Q8H   apixaban   5 mg Oral BID   atorvastatin   40 mg Oral Daily   clopidogrel   75 mg Oral Daily   donepezil   10 mg Oral Q1400   feeding supplement  237 mL Oral BID BM   haloperidol   0.5 mg Oral BID   insulin  aspart  0-5 Units Subcutaneous QHS   insulin  aspart  0-9 Units Subcutaneous TID WC   insulin  glargine-yfgn  10 Units Subcutaneous QPM   lisinopril   5 mg Oral Daily   metoprolol  succinate  12.5 mg Oral Daily   spironolactone  25 mg Oral Daily   Continuous Infusions:     Anti-infectives (From admission, onward)    Start     Dose/Rate Route Frequency Ordered Stop   12/21/23 0600  amoxicillin  (AMOXIL ) capsule 500 mg        500 mg Oral Every 8 hours 12/20/23 0758 12/25/23 0759   12/19/23 1000  cefTRIAXone  (ROCEPHIN ) 1 g in sodium chloride  0.9 % 100 mL IVPB        1 g 200 mL/hr over 30 Minutes Intravenous Every 24 hours 12/18/23 1102 12/21/23 0801   12/18/23 0615  metroNIDAZOLE  (FLAGYL ) IVPB 500 mg        500 mg 100 mL/hr over 60 Minutes Intravenous  Once 12/18/23 0607 12/18/23 0740   12/18/23 0600  vancomycin  (VANCOCIN ) IVPB 1000 mg/200 mL premix       Placed in "Followed by" Linked Group   1,000 mg 200 mL/hr over 60 Minutes Intravenous  Once 12/18/23 0548 12/18/23 0740   12/18/23 0600  vancomycin  (VANCOREADY) IVPB 1500 mg/300 mL       Placed in "Followed by" Linked Group   1,500 mg 150 mL/hr over 120 Minutes Intravenous  Once 12/18/23 0548 12/18/23 1205   12/18/23 0545  ceFEPIme  (MAXIPIME ) 2 g in sodium chloride  0.9 % 100 mL IVPB        2 g 200 mL/hr over 30 Minutes Intravenous  Once 12/18/23 0538 12/18/23 0629   12/18/23 0545  metroNIDAZOLE  (FLAGYL ) tablet 2,000 mg  Status:  Discontinued        2,000 mg Oral  Once 12/18/23 0538 12/18/23 0607              Family Communication/Anticipated D/C date and plan/Code Status   DVT prophylaxis:  apixaban   (ELIQUIS ) tablet 5 mg     Code Status: Full Code  Family Communication: None  Disposition Plan: Plan to discharge to SNF   Status is: Inpatient Remains inpatient appropriate because: Awaiting placement to SNF       Subjective:   No complaints.  No acute events overnight.  He is unable to provide any history because of dementia.  Objective:    Vitals:   12/21/23 2047 12/22/23 0600 12/22/23 0738 12/22/23 0956  BP: (!) 113/57 (!) 189/72 100/72 99/61  Pulse: (!) 59 74 65 (!) 58  Resp: 14 16 16    Temp: (!) 97.4 F (36.3 C) 98.3 F (36.8 C) 98.1 F (36.7 C)   TempSrc: Oral Oral Oral   SpO2: 100% 100% 93%   Weight:      Height:       No data found.   Intake/Output Summary (Last 24 hours) at 12/22/2023 1530 Last data filed at 12/22/2023 1428 Gross per 24 hour  Intake 660 ml  Output 1000 ml  Net -340 ml   Filed Weights   12/18/23 0545  Weight: 100.2 kg    Exam:  GEN: NAD SKIN: Warm and dry EYES: No pallor or icterus ENT: MMM CV: RRR PULM: CTA B ABD: soft, ND, NT, +BS CNS: AAO x 1 (person), non focal EXT: No edema or tenderness     Data Reviewed:   I have personally reviewed following labs and imaging studies:  Labs: Labs show the following:   Basic Metabolic Panel: Recent Labs  Lab 12/18/23 1557 12/18/23 2102 12/19/23 0038 12/19/23 0454 12/21/23 0443  NA 142 142 145 141 141  K 3.7 3.8 4.2 3.9 3.6  CL 111 111 115* 111 107  CO2 21* 22 18* 23 24  GLUCOSE 158* 129* 124* 133* 231*  BUN 27* 22 21 18 20   CREATININE 1.19 1.20 1.08 1.16 0.88  CALCIUM  8.3* 8.3* 8.2* 8.1* 8.4*   GFR Estimated Creatinine Clearance: 78.1 mL/min (by C-G formula based on SCr of 0.88 mg/dL). Liver Function Tests: Recent Labs  Lab 12/18/23 0424  AST 35  ALT 17  ALKPHOS 62  BILITOT 0.6  PROT 7.5  ALBUMIN 3.8   Recent Labs  Lab 12/18/23 0424  LIPASE 45   No results for input(s): "AMMONIA" in the last 168 hours. Coagulation profile Recent Labs  Lab  12/18/23 0424  INR 1.4*    CBC: Recent Labs  Lab 12/18/23 0424 12/19/23 0454  WBC 10.2 6.6  NEUTROABS 8.0*  --   HGB 13.2 11.3*  HCT 42.5 35.3*  MCV 95.7 93.9  PLT 161 114*   Cardiac Enzymes: Recent Labs  Lab 12/18/23 0424  CKTOTAL 142   BNP (last 3 results) No results for input(s): "PROBNP" in the last 8760 hours. CBG: Recent Labs  Lab 12/21/23 1143 12/21/23 1728 12/21/23 2049 12/22/23 0741 12/22/23 1137  GLUCAP 241* 289* 270* 134* 290*   D-Dimer: No results for input(s): "DDIMER" in the last 72 hours. Hgb A1c: No results for input(s): "HGBA1C" in the last 72 hours.  Lipid Profile: No results for input(s): "CHOL", "HDL", "LDLCALC", "TRIG", "CHOLHDL", "LDLDIRECT" in the last 72 hours. Thyroid  function studies: No results for input(s): "TSH", "T4TOTAL", "T3FREE", "THYROIDAB" in the last 72 hours.  Invalid input(s): "FREET3" Anemia work up: No results for input(s): "VITAMINB12", "FOLATE", "FERRITIN", "TIBC", "IRON", "RETICCTPCT" in the last 72 hours. Sepsis Labs: Recent Labs  Lab 12/18/23 0424 12/18/23 0700 12/18/23 1322 12/18/23 1557 12/19/23 0454 12/21/23 0443  WBC 10.2  --   --   --  6.6  --   LATICACIDVEN >9.0* 6.5* 2.7* 2.4*  --  1.5    Microbiology Recent Results (from the past 240 hours)  Resp panel by RT-PCR (RSV, Flu A&B, Covid) Anterior Nasal Swab     Status: None   Collection Time: 12/18/23  4:24 AM   Specimen: Anterior Nasal Swab  Result Value Ref Range Status   SARS Coronavirus 2 by RT PCR NEGATIVE NEGATIVE Final    Comment: (NOTE) SARS-CoV-2 target nucleic acids are NOT DETECTED.  The SARS-CoV-2 RNA is generally detectable in upper respiratory specimens during the acute phase of infection. The lowest concentration of SARS-CoV-2 viral copies this assay can detect is 138 copies/mL. A negative result does not preclude SARS-Cov-2 infection and should not be used as the sole basis for treatment or other patient management decisions. A  negative result may occur with  improper specimen collection/handling, submission of specimen other than nasopharyngeal swab, presence of viral mutation(s) within the areas targeted by this assay, and inadequate number of viral copies(<138 copies/mL). A negative result must be combined with clinical observations, patient history, and epidemiological information. The expected result is Negative.  Fact Sheet for Patients:  BloggerCourse.com  Fact Sheet for Healthcare Providers:  SeriousBroker.it  This test is no t yet approved or cleared by the [place]  FDA and  has been authorized for detection and/or diagnosis of SARS-CoV-2 by FDA under an Emergency Use Authorization (EUA). This EUA will remain  in effect (meaning this test can be used) for the duration of the COVID-19 declaration under Section 564(b)(1) of the Act, 21 U.S.C.section 360bbb-3(b)(1), unless the authorization is terminated  or revoked sooner.       Influenza A by PCR NEGATIVE NEGATIVE Final   Influenza B by PCR NEGATIVE NEGATIVE Final    Comment: (NOTE) The Xpert Xpress SARS-CoV-2/FLU/RSV plus assay is intended as an aid in the diagnosis of influenza from Nasopharyngeal swab specimens and should not be used as a sole basis for treatment. Nasal washings and aspirates are unacceptable for Xpert Xpress SARS-CoV-2/FLU/RSV testing.  Fact Sheet for Patients: BloggerCourse.com  Fact Sheet for Healthcare Providers: SeriousBroker.it  This test is not yet approved or cleared by the [place]  FDA and has been authorized for detection and/or diagnosis of SARS-CoV-2 by FDA under an Emergency Use Authorization (EUA). This EUA will remain in effect (meaning this test can be used) for the duration of  the COVID-19 declaration under Section 564(b)(1) of the Act, 21 U.S.C. section 360bbb-3(b)(1), unless the authorization  is terminated or revoked.     Resp Syncytial Virus by PCR NEGATIVE NEGATIVE Final    Comment: (NOTE) Fact Sheet for Patients: BloggerCourse.com  Fact Sheet for Healthcare Providers: SeriousBroker.it  This test is not yet approved or cleared by the [place]  FDA and has been authorized for detection and/or diagnosis of SARS-CoV-2 by FDA under an Emergency Use Authorization (EUA). This EUA will remain in effect (meaning this test can be used) for the duration of the COVID-19 declaration under Section 564(b)(1) of the Act, 21 U.S.C. section 360bbb-3(b)(1), unless the authorization is terminated or revoked.  Performed at [facility], [address] [place], [place] [address]   Blood Culture (routine x 2)     Status: None (Preliminary result)   Collection Time: 12/18/23  6:31 AM   Specimen: BLOOD RIGHT ARM  Result Value Ref Range Status   Specimen Description BLOOD RIGHT ARM  Final   Special Requests   Final    BOTTLES DRAWN AEROBIC AND ANAEROBIC Blood Culture results may not be optimal due to an inadequate volume of blood received in culture bottles   Culture   Final    NO GROWTH 4 DAYS Performed at [facility], [address], [place], [place] [address]    Report Status PENDING  Incomplete  Blood Culture (routine x 2)     Status: None (Preliminary result)   Collection Time: 12/18/23  6:31 AM   Specimen: BLOOD LEFT ARM  Result Value Ref Range Status   Specimen Description BLOOD LEFT ARM  Final   Special Requests   Final    BOTTLES DRAWN AEROBIC AND ANAEROBIC Blood Culture results may not be optimal due to an inadequate volume of blood received in culture bottles   Culture   Final    NO GROWTH 4 DAYS Performed at [facility], [address] [place], [place] [address]    Report Status PENDING  Incomplete  Urine Culture     Status: Abnormal   Collection Time: 12/18/23  6:45 AM   Specimen:  Urine, Random  Result Value Ref Range Status   Specimen Description   Final    URINE, RANDOM Performed at [facility], [address], [place], [place] [address]    Special Requests   Final    NONE Reflexed from [id number] Performed at [facility], [address] [place], [place] [address]    Culture >=100,000 COLONIES/mL PROTEUS MIRABILIS (A)  Final   Report Status 12/20/2023 FINAL  Final   Organism ID, Bacteria PROTEUS MIRABILIS (A)  Final      Susceptibility   Proteus mirabilis - MIC*    AMPICILLIN <=2 SENSITIVE Sensitive     CEFAZOLIN <=4 SENSITIVE Sensitive     CEFEPIME  <=0.12 SENSITIVE Sensitive     CEFTRIAXONE  <=0.25 SENSITIVE Sensitive     CIPROFLOXACIN <=0.25 SENSITIVE Sensitive     GENTAMICIN <=1 SENSITIVE Sensitive     IMIPENEM 4 SENSITIVE Sensitive     NITROFURANTOIN 128 RESISTANT Resistant     TRIMETH/SULFA <=20 SENSITIVE Sensitive     AMPICILLIN/SULBACTAM <=2 SENSITIVE Sensitive     PIP/TAZO <=4 SENSITIVE Sensitive ug/mL    * >=100,000 COLONIES/mL PROTEUS MIRABILIS    Procedures and diagnostic studies:  No results found.              LOS: 4 days   Sanya Kobrin  [facility]   Pager on [email or web address]. If 7PM-7AM, please contact night-coverage at [email or web address]     12/22/2023, 3:30 PM

## 2023-12-22 NOTE — Care Management Important Message (Signed)
 Important Message  Patient Details  Name: Luke Chen MRN: [id number] Date of Birth: Oct 27, 1940   Important Message Given:  Yes - Medicare IM     Conna Terada W, CMA 12/22/2023, 10:50 AM

## 2023-12-22 NOTE — Progress Notes (Signed)
 Mobility Specialist - Progress Note   12/22/23 0907  Mobility  Activity Ambulated with assistance in hallway;Stood at bedside;Dangled on edge of bed  Level of Assistance Standby assist, set-up cues, supervision of patient - no hands on  Assistive Device Front wheel walker  Distance Ambulated (ft) 70 ft  Activity Response Tolerated well  Mobility Referral Yes  Mobility visit 1 Mobility  Mobility Specialist Start Time (ACUTE ONLY) [id number]  Mobility Specialist Stop Time (ACUTE ONLY) 0901  Mobility Specialist Time Calculation (min) (ACUTE ONLY) 22 min   Pt semi-supine in bed on RA upon arrival. Pt completes bed mobility and dons shirt MinA. Pt STS and ambulates in hallway CGA with no LOB but VC for safe RW usage. Pt returns to recliner with needs in reach and chair alarm activated.   Wash Hack  Mobility Specialist  12/22/23 9:09 AM

## 2023-12-22 NOTE — Plan of Care (Signed)
  Problem: Education: Goal: Ability to describe self-care measures that may prevent or decrease complications (Diabetes Survival Skills Education) will improve Outcome: Progressing Goal: Individualized Educational Video(s) Outcome: Progressing   Problem: Coping: Goal: Ability to adjust to condition or change in health will improve Outcome: Progressing   Problem: Fluid Volume: Goal: Ability to maintain a balanced intake and output will improve Outcome: Progressing   Problem: Health Behavior/Discharge Planning: Goal: Ability to identify and utilize available resources and services will improve Outcome: Progressing   Problem: Metabolic: Goal: Ability to maintain appropriate glucose levels will improve Outcome: Progressing   Problem: Nutritional: Goal: Maintenance of adequate nutrition will improve Outcome: Progressing Goal: Progress toward achieving an optimal weight will improve Outcome: Progressing   Problem: Skin Integrity: Goal: Risk for impaired skin integrity will decrease Outcome: Progressing   Problem: Education: Goal: Knowledge of General Education information will improve Description: Including pain rating scale, medication(s)/side effects and non-pharmacologic comfort measures Outcome: Progressing   Problem: Health Behavior/Discharge Planning: Goal: Ability to manage health-related needs will improve Outcome: Progressing   Problem: Clinical Measurements: Goal: Ability to maintain clinical measurements within normal limits will improve Outcome: Progressing

## 2023-12-23 DIAGNOSIS — N179 Acute kidney failure, unspecified: Secondary | ICD-10-CM | POA: Diagnosis not present

## 2023-12-23 LAB — CULTURE, BLOOD (ROUTINE X 2)
Culture: NO GROWTH
Culture: NO GROWTH

## 2023-12-23 LAB — GLUCOSE, CAPILLARY
Glucose-Capillary: 149 mg/dL — ABNORMAL HIGH (ref 70–99)
Glucose-Capillary: 266 mg/dL — ABNORMAL HIGH (ref 70–99)
Glucose-Capillary: 276 mg/dL — ABNORMAL HIGH (ref 70–99)

## 2023-12-23 MED ORDER — AMOXICILLIN 500 MG PO CAPS: 500.0000 mg | ORAL_CAPSULE | Freq: Three times a day (TID) | ORAL | 0 refills | Status: AC

## 2023-12-23 NOTE — Progress Notes (Signed)
 Mobility Specialist - Progress Note   12/23/23 0933  Mobility  Activity Ambulated with assistance in hallway;Stood at bedside;Dangled on edge of bed  Level of Assistance Contact guard assist, steadying assist  Assistive Device Front wheel walker  Distance Ambulated (ft) 120 ft  Activity Response Tolerated well  Mobility Referral Yes  Mobility visit 1 Mobility  Mobility Specialist Start Time (ACUTE ONLY) [id number]  Mobility Specialist Stop Time (ACUTE ONLY) 0856  Mobility Specialist Time Calculation (min) (ACUTE ONLY) 24 min   Pt supine asleep in bed on RA upon arrival. Pt easily awakened and agreeable to OOB mobility. Pt completes bed mobility, dons gown, STS, and ambulates in hallway CGA with no LOB noted. Pt does need VC throughout for safe RW usage. Pt left in recliner with needs in reach and chair alarm activated.   Wash Hack  Mobility Specialist  12/23/23 9:35 AM

## 2023-12-23 NOTE — Progress Notes (Signed)
 This RN assisted pt to restroom. Pt required frequent redirection and had significant difficulty with safely getting on and off toilet. MD notified and requested PT to see pt again to ensure plan for safe discharge.   PT arrived to eval pt. Per PT, family and pt refused additional assessment. MD Ayiku notified.

## 2023-12-23 NOTE — Progress Notes (Signed)
 Approximately 1420---Pt discharged to home. AVS Discharge instructions provided to pt's wife, Mia Adam, with all questions answered at this time. All PIVs removed, sites WDL. All pt belongings taken with pt. Pt transported home with wife via personal vehicle.

## 2023-12-23 NOTE — Progress Notes (Signed)
 Mobility Specialist - Progress Note   12/23/23 1122  Mobility  Activity Ambulated with assistance to bathroom  Level of Assistance Minimal assist, patient does 75% or more  Assistive Device Front wheel walker  Distance Ambulated (ft) 10 ft  Activity Response Tolerated well  Mobility Referral Yes  Mobility visit 1 Mobility  Mobility Specialist Start Time (ACUTE ONLY) 1036  Mobility Specialist Stop Time (ACUTE ONLY) 1102  Mobility Specialist Time Calculation (min) (ACUTE ONLY) 26 min   Pt OOB in bathroom upon arrival. Pt STS MinA +2 and ambulates back to bed. Pt needs VC throughout for safe RW usage. Pt left in bed with needs in reach, bed alarm activated, and family present.   Wash Hack  Mobility Specialist  12/23/23 11:25 AM

## 2023-12-23 NOTE — Plan of Care (Signed)
  Problem: Education: Goal: Ability to describe self-care measures that may prevent or decrease complications (Diabetes Survival Skills Education) will improve Outcome: Adequate for Discharge Goal: Individualized Educational Video(s) Outcome: Adequate for Discharge   Problem: Coping: Goal: Ability to adjust to condition or change in health will improve Outcome: Adequate for Discharge   Problem: Fluid Volume: Goal: Ability to maintain a balanced intake and output will improve Outcome: Adequate for Discharge   Problem: Health Behavior/Discharge Planning: Goal: Ability to identify and utilize available resources and services will improve Outcome: Adequate for Discharge Goal: Ability to manage health-related needs will improve Outcome: Adequate for Discharge   Problem: Metabolic: Goal: Ability to maintain appropriate glucose levels will improve Outcome: Adequate for Discharge   Problem: Nutritional: Goal: Maintenance of adequate nutrition will improve Outcome: Adequate for Discharge Goal: Progress toward achieving an optimal weight will improve Outcome: Adequate for Discharge   Problem: Skin Integrity: Goal: Risk for impaired skin integrity will decrease Outcome: Adequate for Discharge   Problem: Tissue Perfusion: Goal: Adequacy of tissue perfusion will improve Outcome: Adequate for Discharge   Problem: Education: Goal: Knowledge of General Education information will improve Description: Including pain rating scale, medication(s)/side effects and non-pharmacologic comfort measures Outcome: Adequate for Discharge   Problem: Health Behavior/Discharge Planning: Goal: Ability to manage health-related needs will improve Outcome: Adequate for Discharge   Problem: Clinical Measurements: Goal: Ability to maintain clinical measurements within normal limits will improve Outcome: Adequate for Discharge Goal: Will remain free from infection Outcome: Adequate for Discharge Goal:  Diagnostic test results will improve Outcome: Adequate for Discharge Goal: Respiratory complications will improve Outcome: Adequate for Discharge Goal: Cardiovascular complication will be avoided Outcome: Adequate for Discharge   Problem: Activity: Goal: Risk for activity intolerance will decrease Outcome: Adequate for Discharge   Problem: Nutrition: Goal: Adequate nutrition will be maintained Outcome: Adequate for Discharge   Problem: Coping: Goal: Level of anxiety will decrease Outcome: Adequate for Discharge   Problem: Elimination: Goal: Will not experience complications related to bowel motility Outcome: Adequate for Discharge Goal: Will not experience complications related to urinary retention Outcome: Adequate for Discharge   Problem: Pain Managment: Goal: General experience of comfort will improve and/or be controlled Outcome: Adequate for Discharge   Problem: Safety: Goal: Ability to remain free from injury will improve Outcome: Adequate for Discharge   Problem: Skin Integrity: Goal: Risk for impaired skin integrity will decrease Outcome: Adequate for Discharge   Problem: Acute Rehab PT Goals(only PT should resolve) Goal: Pt Will Go Supine/Side To Sit Outcome: Adequate for Discharge Goal: Patient Will Transfer Sit To/From Stand Outcome: Adequate for Discharge Goal: Pt Will Ambulate Outcome: Adequate for Discharge Goal: Pt Will Go Up/Down Stairs Outcome: Adequate for Discharge   Problem: Acute Rehab OT Goals (only OT should resolve) Goal: Pt. Will Perform Lower Body Bathing Outcome: Adequate for Discharge Goal: Pt. Will Perform Lower Body Dressing Outcome: Adequate for Discharge Goal: Pt. Will Transfer To Toilet Outcome: Adequate for Discharge

## 2023-12-23 NOTE — Discharge Summary (Signed)
 Physician Discharge Summary   Patient: Luke Chen MRN: [id number] DOB: 1941/04/22  Admit date:     12/18/2023  Discharge date: 12/23/23  Discharge Physician: Sheril Dines   PCP: Wilnette Haste., Theron Flavin, MD   Recommendations at discharge:   Follow-up with PCP in 1 week  Discharge Diagnoses: Principal Problem:   AKI (acute kidney injury) (HCC) Active Problems:   AMS (altered mental status)   Impaired ambulation   UTI (urinary tract infection)   Sepsis (HCC)  Resolved Problems:   * No resolved hospital problems. Luke Chen [employer] Course:  Luke Chen is a 83 y.o. 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    Assessment and Plan:   Sepsis secondary to acute complicated UTI, nephrolithiasis: Urine culture showed Proteus mirabilis.  Completed 3 days of IV ceftriaxone  on 12/20/2023.  Continue amoxicillin  through 12/24/2023 to complete 7 days of treatment.       AKI: Improved.  Creatinine down from 1.74-1.16. Severe lactic acidosis: Resolved (from >9-2.4-1.5)     Acute metabolic encephalopathy, underlying dementia: MRI negative for acute stroke. Continue supportive care and fall precautions at home     Type II DM with hyperglycemia, ?mild DKA on admission: Briefly required insulin  drip.   Continue metformin  and glipizide  at discharge. Close follow-up with PCP recommended for ongoing management of diabetes mellitus    Chronic HFrEF: Compensated.  Continue Toprol -XL, Aldactone and lisinopril  if BP allows.  BP has been soft at times. History of LV thrombus: Continue Eliquis  CAD with coronary stent to LCx in July 2022: Continue Plavix  History of stroke     Debility: PT and OT recommended discharge to SNF.  However, his wife said  that she prefers that patient be discharged home.  She said patient does better at home.  She understands that patient is at risk for falls but she is convinced that she can take care of him at home.     Patient is medically stable for discharge.  Discharge plan discussed with his wife at the bedside.  Alexandra, RN, was also at the bedside during this discussion.      Consultants: None Procedures performed: None Disposition: Home health Diet recommendation:  Discharge Diet Orders (From admission, onward)     Start     Ordered   12/23/23 0000  Diet - low sodium heart healthy        12/23/23 1033           Cardiac and Carb modified diet DISCHARGE MEDICATION: Allergies as of 12/23/2023       Reactions   Gramineae Pollens Other (See Comments)   12/14/21 Patient's wife states "don't know if it's his medications or allergies but he gets a runny nose", Annette Killings MD made aware   Other Other (See Comments)   12/14/21 Patient's wife states "don't know if it's his medications or allergies but he gets a runny nose", Annette Killings MD made aware        Medication List     STOP taking these medications    aspirin  EC 81 MG tablet   lovastatin  20 MG tablet Commonly known as: MEVACOR    spironolactone 25 MG tablet Commonly known as: ALDACTONE       TAKE these medications    amoxicillin  500 MG capsule  Commonly known as: AMOXIL  Take 1 capsule (500 mg total) by mouth every 8 (eight) hours for 2 days.   ascorbic acid 1000 MG tablet Commonly known as: VITAMIN C Take 1,000 mg by mouth daily at 2 PM.   atorvastatin  40 MG tablet Commonly known as: LIPITOR Take 40 mg by mouth daily.   clopidogrel  75 MG tablet Commonly known as: PLAVIX  Take 75 mg by mouth daily.   cyanocobalamin 1000 MCG tablet Commonly known as: VITAMIN B12 Take 1,000 mcg by mouth once a week.   donepezil  10 MG tablet Commonly known as: ARICEPT  TAKE 1 TABLET BY MOUTH EVERY DAY What changed: Another  medication with the same name was removed. Continue taking this medication, and follow the directions you see here.   Eliquis  5 MG Tabs tablet Generic drug: apixaban  Take 5 mg by mouth 2 (two) times daily.   glimepiride  2 MG tablet Commonly known as: AMARYL  Take 1 tablet (2 mg total) by mouth daily at 6 (six) AM.   lisinopril  5 MG tablet Commonly known as: ZESTRIL  Take 1 tablet (5 mg total) by mouth daily.   metFORMIN  1000 MG tablet Commonly known as: GLUCOPHAGE  TAKE 1 TABLET BY MOUTH TWICE A DAY   metoprolol  succinate 25 MG 24 hr tablet Commonly known as: TOPROL -XL Take 0.5 tablets by mouth daily.               Discharge Care Instructions  (From admission, onward)           Start     Ordered   12/23/23 0000  Discharge wound care:       Comments: Apply Mepilex border to affected area   12/23/23 1033            Follow-up Information     P.A., Theron Flavin, MD Follow up.   Specialty: Cardiology Why: Hospital follow up Contact information: [address] [place] [address] [phone]                Discharge Exam: Filed Weights   12/18/23 0545  Weight: 100.2 kg   GEN: NAD SKIN: Warm and dry EYES: No pallor or icterus ENT: MMM CV: RRR PULM: CTA B ABD: soft, ND, NT, +BS CNS: AAO x 1 (person), non focal EXT: No edema or tenderness   Condition at discharge: stable  The results of significant diagnostics from this hospitalization (including imaging, microbiology, ancillary and laboratory) are listed below for reference.   Imaging Studies: DG Chest 1 View Result Date: 12/19/2023 CLINICAL DATA:  CHF. EXAM: CHEST  1 VIEW COMPARISON:  12/18/2023 FINDINGS: Heart size is normal. Aortic atherosclerosis. Lung volumes are decreased. Mild platelike atelectasis in the right base. No pleural fluid or interstitial edema. No airspace consolidation. Visualized osseous structures appear intact. IMPRESSION: Low lung volumes and right base  atelectasis. No pleural fluid or edema. Electronically Signed   By: Kimberley Penman M.D.   On: 12/19/2023 07:14   MR BRAIN WO CONTRAST Result Date: 12/18/2023 CLINICAL DATA:  Neuro deficit, acute, stroke suspected. Dementia. Altered mental status. Unable to walk. Generalized weakness. EXAM: MRI HEAD WITHOUT CONTRAST TECHNIQUE: Multiplanar, multiecho pulse sequences of the brain and surrounding structures were obtained without intravenous contrast. COMPARISON:  Head CT earlier same day FINDINGS: Brain: Diffusion imaging does not show any acute or subacute infarction or other cause of restricted diffusion. Small-vessel ischemic changes are seen affecting the pons. Few old small vessel cerebellar infarctions. Cerebral hemispheres show generalized atrophy. There are old bilateral occipital  cortical and subcortical infarctions. Old bilateral parietal cortical and subcortical infarctions, larger on the left than the right. Chronic small-vessel ischemic changes of the left thalamus and cerebral hemispheric white matter. No mass, recent hemorrhage, hydrocephalus or extra-axial collection. Hemosiderin deposition in the region of the old occipital strokes. Vascular: Major vessels at the base of the brain show flow. Skull and upper cervical spine: Negative Sinuses/Orbits: Clear/normal Other: None IMPRESSION: No acute finding. Generalized atrophy. Chronic small-vessel ischemic changes of the pons, left thalamus and cerebral hemispheric white matter. Old bilateral occipital and parietal cortical and subcortical infarctions. Electronically Signed   By: Bettylou Brunner M.D.   On: 12/18/2023 11:29   CT CHEST ABDOMEN PELVIS W CONTRAST Result Date: 12/18/2023 CLINICAL DATA:  One day history of generalized weakness and refusal to walk EXAM: CT CHEST, ABDOMEN, AND PELVIS WITH CONTRAST TECHNIQUE: Multidetector CT imaging of the chest, abdomen and pelvis was performed following the standard protocol during bolus administration of  intravenous contrast. RADIATION DOSE REDUCTION: This exam was performed according to the departmental dose-optimization program which includes automated exposure control, adjustment of the mA and/or kV according to patient size and/or use of iterative reconstruction technique. CONTRAST:  75mL OMNIPAQUE  IOHEXOL  350 MG/ML SOLN COMPARISON:  Same day chest radiograph, CT abdomen and pelvis dated 05/16/2017, CT chest dated 10/31/2014 FINDINGS: CT CHEST FINDINGS Cardiovascular: Normal heart size. No significant pericardial fluid/thickening. Great vessels are normal in course and caliber. No central pulmonary emboli. Coronary artery calcifications. Mediastinum/Nodes: Imaged thyroid  gland without nodules meeting criteria for imaging follow-up by size. Normal esophagus. No pathologically enlarged axillary, supraclavicular, mediastinal, or hilar lymph nodes. Lungs/Pleura: The central airways are patent. Small volume secretions within the lower trachea. Left greater than right mild peribronchial wall thickening, most notable in the left lower lobe. Dependent ground-glass opacity within the left upper lobe. Bilateral lower lobe subpleural ground-glass opacity, likely atelectasis. No pneumothorax. No pleural effusion. Musculoskeletal: No acute or abnormal lytic or blastic osseous lesions. Multilevel degenerative changes of the thoracic spine. CT ABDOMEN PELVIS FINDINGS Hepatobiliary: No focal hepatic lesions. No intra or extrahepatic biliary ductal dilation. Normal gallbladder. Pancreas: No focal lesions or main ductal dilation. Spleen: Normal in size without focal abnormality. Adrenals/Urinary Tract: No adrenal nodules. Right renal cortical scarring. No suspicious renal masses. Mild left hydroureteronephrosis to the level of the ureterovesical junction without definite obstructing stone. No right hydronephrosis. Punctate nonobstructing bilateral renal stones. Underdistended urinary bladder with irregular circumferential mural  thickening, midline superior diverticulum, and mild pericystic stranding. Bladder contains a large oval bladder stone. Stomach/Bowel: Normal appearance of the stomach. No evidence of bowel wall thickening, distention, or inflammatory changes. Colonic diverticulosis without acute diverticulitis. Appendix is not discretely seen. Vascular/Lymphatic: Aortic atherosclerosis. No enlarged abdominal or pelvic lymph nodes. Reproductive: Mild enlargement of the prostate gland. Other: No free fluid, fluid collection, or free air. Musculoskeletal: No acute or abnormal lytic or blastic osseous findings. Multilevel degenerative changes of the lumbar spine. Small fat-containing left inguinal hernia. 1.6 cm mildly thick-walled subcutaneous cystic focus within the right posterior paramidline region overlying the level of T12-L1 (3:57). IMPRESSION: 1. Mild left hydroureteronephrosis to the level of the ureterovesical junction without definite obstructing stone, which may reflect ascending urinary tract infection. 2. Underdistended urinary bladder with irregular circumferential mural thickening, midline superior diverticulum, and mild pericystic stranding, which may represent cystitis on a background of chronic outlet obstruction. 3. Left greater than right mild peribronchial wall thickening, most notable in the left lower lobe, which may be seen in the  setting of bronchitis. 4. Dependent ground-glass opacity within the left upper lobe, which may represent aspiration. 5. A 1.6 cm subcutaneous cystic focus within the right posterior paramidline region overlying the level of T12-L1 may represent an epidermal inclusion cyst. 6. Aortic Atherosclerosis (ICD10-I70.0). Coronary artery calcifications. Assessment for potential risk factor modification, dietary therapy or pharmacologic therapy may be warranted, if clinically indicated. Electronically Signed   By: Limin  Xu M.D.   On: 12/18/2023 09:56   CT Angio Head Neck W WO CM Result Date:  12/18/2023 CLINICAL DATA:  Stroke suspected EXAM: CT ANGIOGRAPHY HEAD AND NECK WITH AND WITHOUT CONTRAST TECHNIQUE: Multidetector CT imaging of the head and neck was performed using the standard protocol during bolus administration of intravenous contrast. Multiplanar CT image reconstructions and MIPs were obtained to evaluate the vascular anatomy. Carotid stenosis measurements (when applicable) are obtained utilizing NASCET criteria, using the distal internal carotid diameter as the denominator. RADIATION DOSE REDUCTION: This exam was performed according to the departmental dose-optimization program which includes automated exposure control, adjustment of the mA and/or kV according to patient size and/or use of iterative reconstruction technique. CONTRAST:  75mL OMNIPAQUE  IOHEXOL  350 MG/ML SOLN COMPARISON:  Head CT from earlier today FINDINGS: CT HEAD FINDINGS Brain: Patchy chronic infarcts along the bilateral occipital parietal and left parietal cortex. No acute hemorrhage, hydrocephalus, mass, or collection. Vascular: No hyperdense vessel Skull: Normal. Negative for fracture or focal lesion. Sinuses/Orbits: No acute finding. Review of the MIP images confirms the above findings CTA NECK FINDINGS Aortic arch: Extensive atheromatous plaque where partially covered. Notable plaque at the great vessel ostia. Right carotid system: Mixed density plaque at the brachiocephalic origin causes 70% stenosis. Mild atheromatous plaque in the carotid circulation without flow reducing stenosis or ulceration. Left carotid system: Mild generalized atheromatous wall thickening of the common and proximal internal carotid arteries. No focal or flow reducing stenosis. Vertebral arteries: No flow reducing stenosis in the subclavian arteries. Atheromatous irregularity of the vertebral arteries without visible flow reducing stenosis. The right vertebral origin is obscured by streak artifact from intravenous contrast. Skeleton: Areas of  sclerosis primarily along degenerative spurring and at the edentulous mandible. No focal or aggressive lesion seen. Other neck: No acute finding Upper chest: No acute finding Review of the MIP images confirms the above findings CTA HEAD FINDINGS Anterior circulation: Atheromatous calcification of the carotid siphons with approximally 50% left paraclinoid ICA narrowing. No major branch occlusion, beading, or aneurysm Posterior circulation: The right vertebral artery ends in the right PICA. Robust flow in the left vertebral and basilar arteries. Generalized atheromatous irregularity and attenuation of the posterior cerebral arteries without reversible proximal stenosis or major branch occlusion. Venous sinuses: Unremarkable for arterial timing Anatomic variants: None significant Review of the MIP images confirms the above findings IMPRESSION: No emergent arterial finding. Atherosclerosis in the neck, most notably 70% atheromatous narrowing at the brachiocephalic origin. Extensive intracranial atherosclerosis with 50% left paraclinoid ICA narrowing Electronically Signed   By: Ronnette Coke M.D.   On: 12/18/2023 09:23   DG Chest Port 1 View Result Date: 12/18/2023 CLINICAL DATA:  Chest pain EXAM: PORTABLE CHEST 1 VIEW COMPARISON:  10/31/2014 FINDINGS: Normal heart size and mediastinal contours.  Coronary stenting. There is no edema, consolidation, effusion, or pneumothorax. Bilateral skin folds. Artifact from EKG leads. IMPRESSION: No evidence of acute disease. Electronically Signed   By: Ronnette Coke M.D.   On: 12/18/2023 05:15   CT Head Wo Contrast Result Date: 12/18/2023 CLINICAL DATA:  Mental status  change with unknown cause EXAM: CT HEAD WITHOUT CONTRAST TECHNIQUE: Contiguous axial images were obtained from the base of the skull through the vertex without intravenous contrast. RADIATION DOSE REDUCTION: This exam was performed according to the departmental dose-optimization program which includes automated  exposure control, adjustment of the mA and/or kV according to patient size and/or use of iterative reconstruction technique. COMPARISON:  02/05/2019 FINDINGS: Brain: Moderate area of low-density involving left parietal cortex with areas approaching CSF density and volume loss, chronic findings. Even more chronic and low-density appearing infarcts in the bilateral occipital parietal cortex. Generalized brain atrophy that has progressed from 2020. Small chronic infarct in the right cerebellum and likely at the vermis. No acute hemorrhage, hydrocephalus, or mass. Vascular: No hyperdense vessel or unexpected calcification. Skull: Normal. Negative for fracture or focal lesion. Sinuses/Orbits: No acute finding. IMPRESSION: No acute finding. Chronic bilateral occipital parietal infarcts since 2020. Generalized brain atrophy that is progressed from 2020. Electronically Signed   By: Ronnette Coke M.D.   On: 12/18/2023 05:04    Microbiology: Results for orders placed or performed during the hospital encounter of 12/18/23  Resp panel by RT-PCR (RSV, Flu A&B, Covid) Anterior Nasal Swab     Status: None   Collection Time: 12/18/23  4:24 AM   Specimen: Anterior Nasal Swab  Result Value Ref Range Status   SARS Coronavirus 2 by RT PCR NEGATIVE NEGATIVE Final    Comment: (NOTE) SARS-CoV-2 target nucleic acids are NOT DETECTED.  The SARS-CoV-2 RNA is generally detectable in upper respiratory specimens during the acute phase of infection. The lowest concentration of SARS-CoV-2 viral copies this assay can detect is 138 copies/mL. A negative result does not preclude SARS-Cov-2 infection and should not be used as the sole basis for treatment or other patient management decisions. A negative result may occur with  improper specimen collection/handling, submission of specimen other than nasopharyngeal swab, presence of viral mutation(s) within the areas targeted by this assay, and inadequate number of  viral copies(<138 copies/mL). A negative result must be combined with clinical observations, patient history, and epidemiological information. The expected result is Negative.  Fact Sheet for Patients:  BloggerCourse.com  Fact Sheet for Healthcare Providers:  SeriousBroker.it  This test is no t yet approved or cleared by the [place]  FDA and  has been authorized for detection and/or diagnosis of SARS-CoV-2 by FDA under an Emergency Use Authorization (EUA). This EUA will remain  in effect (meaning this test can be used) for the duration of the COVID-19 declaration under Section 564(b)(1) of the Act, 21 U.S.C.section 360bbb-3(b)(1), unless the authorization is terminated  or revoked sooner.       Influenza A by PCR NEGATIVE NEGATIVE Final   Influenza B by PCR NEGATIVE NEGATIVE Final    Comment: (NOTE) The Xpert Xpress SARS-CoV-2/FLU/RSV plus assay is intended as an aid in the diagnosis of influenza from Nasopharyngeal swab specimens and should not be used as a sole basis for treatment. Nasal washings and aspirates are unacceptable for Xpert Xpress SARS-CoV-2/FLU/RSV testing.  Fact Sheet for Patients: BloggerCourse.com  Fact Sheet for Healthcare Providers: SeriousBroker.it  This test is not yet approved or cleared by the [place]  FDA and has been authorized for detection and/or diagnosis of SARS-CoV-2 by FDA under an Emergency Use Authorization (EUA). This EUA will remain in effect (meaning this test can be used) for the duration of the COVID-19 declaration under Section 564(b)(1) of the Act, 21 U.S.C. section 360bbb-3(b)(1), unless the authorization is terminated or revoked.  Resp Syncytial Virus by PCR NEGATIVE NEGATIVE Final    Comment: (NOTE) Fact Sheet for Patients: BloggerCourse.com  Fact Sheet for Healthcare  Providers: SeriousBroker.it  This test is not yet approved or cleared by the [place]  FDA and has been authorized for detection and/or diagnosis of SARS-CoV-2 by FDA under an Emergency Use Authorization (EUA). This EUA will remain in effect (meaning this test can be used) for the duration of the COVID-19 declaration under Section 564(b)(1) of the Act, 21 U.S.C. section 360bbb-3(b)(1), unless the authorization is terminated or revoked.  Performed at [facility], [address] [place], [place] [address]   Blood Culture (routine x 2)     Status: None   Collection Time: 12/18/23  6:31 AM   Specimen: BLOOD RIGHT ARM  Result Value Ref Range Status   Specimen Description BLOOD RIGHT ARM  Final   Special Requests   Final    BOTTLES DRAWN AEROBIC AND ANAEROBIC Blood Culture results may not be optimal due to an inadequate volume of blood received in culture bottles   Culture   Final    NO GROWTH 5 DAYS Performed at [facility], [address] [place], [place] [address]    Report Status 12/23/2023 FINAL  Final  Blood Culture (routine x 2)     Status: None   Collection Time: 12/18/23  6:31 AM   Specimen: BLOOD LEFT ARM  Result Value Ref Range Status   Specimen Description BLOOD LEFT ARM  Final   Special Requests   Final    BOTTLES DRAWN AEROBIC AND ANAEROBIC Blood Culture results may not be optimal due to an inadequate volume of blood received in culture bottles   Culture   Final    NO GROWTH 5 DAYS Performed at [facility], [address] [place], [place] [address]    Report Status 12/23/2023 FINAL  Final  Urine Culture     Status: Abnormal   Collection Time: 12/18/23  6:45 AM   Specimen: Urine, Random  Result Value Ref Range Status   Specimen Description   Final    URINE, RANDOM Performed at [facility], [address] [place], [place] [address]    Special Requests   Final    NONE Reflexed from  [id number] Performed at [facility], [address] [place], [place] [address]    Culture >=100,000 COLONIES/mL PROTEUS MIRABILIS (A)  Final   Report Status 12/20/2023 FINAL  Final   Organism ID, Bacteria PROTEUS MIRABILIS (A)  Final      Susceptibility   Proteus mirabilis - MIC*    AMPICILLIN <=2 SENSITIVE Sensitive     CEFAZOLIN <=4 SENSITIVE Sensitive     CEFEPIME  <=0.12 SENSITIVE Sensitive     CEFTRIAXONE  <=0.25 SENSITIVE Sensitive     CIPROFLOXACIN <=0.25 SENSITIVE Sensitive     GENTAMICIN <=1 SENSITIVE Sensitive     IMIPENEM 4 SENSITIVE Sensitive     NITROFURANTOIN 128 RESISTANT Resistant     TRIMETH/SULFA <=20 SENSITIVE Sensitive     AMPICILLIN/SULBACTAM <=2 SENSITIVE Sensitive     PIP/TAZO <=4 SENSITIVE Sensitive ug/mL    * >=100,000 COLONIES/mL PROTEUS MIRABILIS    Labs: CBC: Recent Labs  Lab 12/18/23 0424 12/19/23 0454  WBC 10.2 6.6  NEUTROABS 8.0*  --   HGB 13.2 11.3*  HCT 42.5 35.3*  MCV 95.7 93.9  PLT 161 114*   Basic Metabolic Panel: Recent Labs  Lab 12/18/23 1557 12/18/23 2102 12/19/23 0038 12/19/23 0454 12/21/23 0443  NA 142 142 145 141 141  K 3.7 3.8 4.2 3.9 3.6  CL 111 111 115* 111 107  CO2 21* 22 18* 23 24  GLUCOSE 158* 129* 124* 133* 231*  BUN 27* 22 21 18 20   CREATININE 1.19 1.20 1.08 1.16 0.88  CALCIUM  8.3* 8.3* 8.2* 8.1* 8.4*   Liver Function Tests: Recent Labs  Lab 12/18/23 0424  AST 35  ALT 17  ALKPHOS 62  BILITOT 0.6  PROT 7.5  ALBUMIN 3.8   CBG: Recent Labs  Lab 12/22/23 1700 12/22/23 1842 12/22/23 2205 12/23/23 0516 12/23/23 0920  GLUCAP 275* 314* 282* 149* 266*    Discharge time spent: greater than 30 minutes.  Signed: Sheril Dines, MD [facility] 12/23/2023

## 2023-12-23 NOTE — TOC Transition Note (Signed)
 Transition of Care [facility]) - Discharge Note   Patient Details  Name: Luke Chen MRN: [id number] Date of Birth: 22-Dec-1940  Transition of Care [facility]) CM/SW Contact:  Marino Sias, RN Phone Number: 12/23/2023, 12:15 PM   Clinical Narrative: Patient to discharge home today with spose, [name] who declines SNF and receptive to HHPT, no preference, arranged with [facility].      Final next level of care: Home w Home Health Services Barriers to Discharge: Barriers Resolved   Patient Goals and CMS Choice Patient states their goals for this hospitalization and ongoing recovery are:: To return home CMS Medicare.gov Compare Post Acute Care list provided to:: Patient Represenative (must comment) (Wife, [name]) Choice offered to / list presented to : Spouse      Discharge Placement                  Name of family member notified: Wife [name] Patient and family notified of of transfer: 12/23/23  Discharge Plan and Services Additional resources added to the After Visit Summary for                  DME Arranged: N/A DME Agency: NA       HH Arranged: PT HH Agency: [facility] Date [facility] Agency Contacted: 12/23/23   Representative spoke with at [facility] Agency: Bartholomew Light via text  Social Drivers of Health (SDOH) Interventions SDOH Screenings   Food Insecurity: No Food Insecurity (08/26/2021)  Housing: Unknown (11/28/2023)   Received from [facility]  Transportation Needs: No Transportation Needs (08/26/2021)  Alcohol Screen: Low Risk  (08/26/2021)  Depression (PHQ2-9): Low Risk  (08/26/2021)  Financial Resource Strain: Low Risk  (08/26/2021)  Physical Activity: Inactive (08/26/2021)  Social Connections: Moderately Isolated (08/26/2021)  Stress: No Stress Concern Present (08/26/2021)  Tobacco Use: Low Risk  (12/18/2023)     Readmission Risk Interventions     No data to display

## 2024-01-17 ENCOUNTER — Ambulatory Visit: Admitting: Urology

## 2024-01-28 NOTE — Progress Notes (Unsigned)
 Letter sent.

## 2024-07-04 ENCOUNTER — Inpatient Hospital Stay
Admission: EM | Admit: 2024-07-04 | Discharge: 2024-07-17 | DRG: 637 | Disposition: A | Discharge status: Skilled Nursing Facility

## 2024-07-04 ENCOUNTER — Other Ambulatory Visit: Payer: Self-pay

## 2024-07-04 ENCOUNTER — Emergency Department

## 2024-07-04 DIAGNOSIS — N179 Acute kidney failure, unspecified: Secondary | ICD-10-CM | POA: Diagnosis present

## 2024-07-04 DIAGNOSIS — Z9889 Other specified postprocedural states: Secondary | ICD-10-CM

## 2024-07-04 DIAGNOSIS — R54 Age-related physical debility: Secondary | ICD-10-CM | POA: Insufficient documentation

## 2024-07-04 DIAGNOSIS — J90 Pleural effusion, not elsewhere classified: Secondary | ICD-10-CM

## 2024-07-04 DIAGNOSIS — E1310 Other specified diabetes mellitus with ketoacidosis without coma: Principal | ICD-10-CM

## 2024-07-04 DIAGNOSIS — F0390 Unspecified dementia without behavioral disturbance: Secondary | ICD-10-CM | POA: Insufficient documentation

## 2024-07-04 DIAGNOSIS — E1110 Type 2 diabetes mellitus with ketoacidosis without coma: Secondary | ICD-10-CM

## 2024-07-04 DIAGNOSIS — F0280 Dementia in other diseases classified elsewhere without behavioral disturbance: Secondary | ICD-10-CM | POA: Diagnosis present

## 2024-07-04 DIAGNOSIS — R739 Hyperglycemia, unspecified: Secondary | ICD-10-CM | POA: Diagnosis present

## 2024-07-04 DIAGNOSIS — R7989 Other specified abnormal findings of blood chemistry: Secondary | ICD-10-CM | POA: Diagnosis present

## 2024-07-04 DIAGNOSIS — I5022 Chronic systolic (congestive) heart failure: Secondary | ICD-10-CM

## 2024-07-04 DIAGNOSIS — Z7901 Long term (current) use of anticoagulants: Secondary | ICD-10-CM

## 2024-07-04 DIAGNOSIS — D509 Iron deficiency anemia, unspecified: Secondary | ICD-10-CM

## 2024-07-04 DIAGNOSIS — R531 Weakness: Secondary | ICD-10-CM

## 2024-07-04 DIAGNOSIS — I10 Essential (primary) hypertension: Secondary | ICD-10-CM | POA: Diagnosis present

## 2024-07-04 LAB — BLOOD GAS, VENOUS
Acid-base deficit: 4 mmol/L — ABNORMAL HIGH (ref 0.0–2.0)
Bicarbonate: 20.9 mmol/L (ref 20.0–28.0)
O2 Saturation: 51.8 %
Patient temperature: 37
pCO2, Ven: 37 mmHg — ABNORMAL LOW (ref 44–60)
pH, Ven: 7.36 (ref 7.25–7.43)
pO2, Ven: 34 mmHg (ref 32–45)

## 2024-07-04 LAB — CK: Total CK: 148 U/L (ref 49–397)

## 2024-07-04 LAB — TROPONIN T, HIGH SENSITIVITY
Troponin T High Sensitivity: 143 ng/L (ref 0–19)
Troponin T High Sensitivity: 128 ng/L (ref 0–19)

## 2024-07-04 LAB — CBG MONITORING, ED: Glucose-Capillary: 266 mg/dL — ABNORMAL HIGH (ref 70–99)

## 2024-07-04 LAB — BASIC METABOLIC PANEL WITH GFR
Anion gap: 19 — ABNORMAL HIGH (ref 5–15)
BUN: 37 mg/dL — ABNORMAL HIGH (ref 8–23)
CO2: 18 mmol/L — ABNORMAL LOW (ref 22–32)
Calcium: 9.1 mg/dL (ref 8.9–10.3)
Chloride: 101 mmol/L (ref 98–111)
Creatinine, Ser: 1.36 mg/dL — ABNORMAL HIGH (ref 0.61–1.24)
GFR, Estimated: 52 mL/min — ABNORMAL LOW (ref >60)
Glucose, Bld: 294 mg/dL — ABNORMAL HIGH (ref 70–99)
Potassium: 4.4 mmol/L (ref 3.5–5.1)
Sodium: 138 mmol/L (ref 135–145)

## 2024-07-04 LAB — CBC
HCT: 33 % — ABNORMAL LOW (ref 39.0–52.0)
Hemoglobin: 10.7 g/dL — ABNORMAL LOW (ref 13.0–17.0)
MCH: 28.7 pg (ref 26.0–34.0)
MCHC: 32.4 g/dL (ref 30.0–36.0)
MCV: 88.5 fL (ref 80.0–100.0)
Platelets: 230 K/uL (ref 150–400)
RBC: 3.73 MIL/uL — ABNORMAL LOW (ref 4.22–5.81)
RDW: 18.4 % — ABNORMAL HIGH (ref 11.5–15.5)
WBC: 12.1 K/uL — ABNORMAL HIGH (ref 4.0–10.5)
nRBC: 0 % (ref 0.0–0.2)

## 2024-07-04 LAB — BETA-HYDROXYBUTYRIC ACID: Beta-Hydroxybutyric Acid: 0.39 mmol/L — ABNORMAL HIGH (ref 0.05–0.27)

## 2024-07-04 MED ORDER — LACTATED RINGERS IV BOLUS
1000.0000 mL | Freq: Once | INTRAVENOUS | Status: AC
  Administered 2024-07-04: 1000 mL via INTRAVENOUS

## 2024-07-04 MED ORDER — LACTATED RINGERS IV BOLUS: 1000.0000 mL | Freq: Once | INTRAVENOUS | Status: DC

## 2024-07-04 MED ORDER — DEXTROSE IN LACTATED RINGERS 5 % IV SOLN: INTRAVENOUS | Status: AC

## 2024-07-04 MED ORDER — LACTATED RINGERS IV SOLN: INTRAVENOUS | Status: AC

## 2024-07-04 MED ORDER — INSULIN REGULAR(HUMAN) IN NACL 100-0.9 UT/100ML-% IV SOLN
INTRAVENOUS | Status: DC
  Administered 2024-07-04: 6.5 [IU]/h via INTRAVENOUS
  Filled 2024-07-04: qty 100

## 2024-07-04 MED ORDER — LACTATED RINGERS IV BOLUS
500.0000 mL | Freq: Once | INTRAVENOUS | Status: AC
  Administered 2024-07-04: 500 mL via INTRAVENOUS

## 2024-07-04 MED ORDER — DEXTROSE 50 % IV SOLN
0.0000 mL | INTRAVENOUS | Status: DC | PRN
  Filled 2024-07-04: qty 50

## 2024-07-04 MED ORDER — POTASSIUM CHLORIDE 10 MEQ/100ML IV SOLN
10.0000 meq | INTRAVENOUS | Status: AC
  Administered 2024-07-04 – 2024-07-05 (×2): 10 meq via INTRAVENOUS
  Filled 2024-07-04 (×2): qty 100

## 2024-07-04 NOTE — ED Notes (Signed)
 Fall risk bundle is currently in place.

## 2024-07-04 NOTE — ED Provider Notes (Signed)
 [facility] Provider Note    Event Date/Time   First MD Initiated Contact with Patient 07/04/24 2050     (approximate)   History   No chief complaint on file.   HPI  Luke Chen is a 83 y.o. 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  According to EMS patient was called from wife and said that he was having weakness, laid down on the ground and was complaining of pain.  Glucose was elevated in the 400s.  Patient states that he is having some pain but unable to give any further history.  He is unable to state where he is hurting.  Denies any shortness of breath.     Physical Exam   Triage Vital Signs: ED Triage Vitals  Encounter Vitals Group     BP --      Girls Systolic BP Percentile --      Girls Diastolic BP Percentile --      Boys Systolic BP Percentile --      Boys Diastolic BP Percentile --      Pulse --      Resp --      Temp --      Temp src --      SpO2 --      Weight 07/04/24 2053 220 lb 7.4 oz (100 kg)     Height --      Head Circumference --      Peak Flow --      Pain Score 07/04/24 2052 0     Pain Loc --      Pain Education --      Exclude from Growth Chart --     Most recent vital signs: Vitals:   07/04/24 2057 07/04/24 2156  BP: 102/63 99/74  Pulse:  65  Resp: 19 14  Temp: 97.7 F (36.5 C)   SpO2: 96% 95%    Physical Exam Constitutional:      Appearance: He is well-developed.  HENT:     Head: Atraumatic.  Eyes:     Extraocular Movements: Extraocular movements intact.     Conjunctiva/sclera: Conjunctivae normal.     Pupils: Pupils are equal, round, and reactive to light.  Cardiovascular:     Rate and Rhythm: Regular rhythm.  Pulmonary:     Effort: No respiratory distress.  Abdominal:     Tenderness: There is no abdominal  tenderness.  Musculoskeletal:     Cervical back: Normal range of motion. No tenderness.     Right lower leg: No edema.     Comments: Midline cervical spine tenderness.  No pain to the pelvis.  No pain to bilateral lower extremities  Skin:    General: Skin is warm.     Capillary Refill: Capillary refill takes less than 2 seconds.  Neurological:     Mental Status: He is alert. Mental status is at baseline.     IMPRESSION / MDM / ASSESSMENT AND PLAN / ED COURSE  I reviewed the triage vital signs and the nursing notes.  Differential diagnosis including hyperglycemia, hyperosmolar syndrome, DKA, intracranial hemorrhage, urinary tract infection, pneumonia, metabolic encephalopathy  EKG  I, Clotilda Punter, the attending physician, personally viewed and interpreted this ECG. EKG with significant wandering baseline.  Significant artifact.  Nonspecific changes, no significant change when compared to prior EKG.  Plan for repeat EKG  Repeat EKG with signs of nonspecific conduction delay.  No change when compared to prior.    No tachycardic or bradycardic dysrhythmias while on cardiac telemetry.  RADIOLOGY I independently reviewed imaging, my interpretation of imaging: CT scan of the head -no findings of acute intracranial process.  Chest x-ray no signs of pneumonia or cardiomegaly  LABS (all labs ordered are listed, but only abnormal results are displayed) Labs interpreted as -    Labs Reviewed  CBC - Abnormal; Notable for the following components:      Result Value   WBC 12.1 (*)    RBC 3.73 (*)    Hemoglobin 10.7 (*)    HCT 33.0 (*)    RDW 18.4 (*)    All other components within normal limits  BASIC METABOLIC PANEL WITH GFR - Abnormal; Notable for the following components:   CO2 18 (*)    Glucose, Bld 294 (*)    BUN 37 (*)    Creatinine, Ser 1.36 (*)    GFR, Estimated 52 (*)    Anion gap 19 (*)    All other components within normal limits  BLOOD GAS, VENOUS - Abnormal;  Notable for the following components:   pCO2, Ven 37 (*)    Acid-base deficit 4.0 (*)    All other components within normal limits  BETA-HYDROXYBUTYRIC ACID - Abnormal; Notable for the following components:   Beta-Hydroxybutyric Acid 0.39 (*)    All other components within normal limits  TROPONIN T, HIGH SENSITIVITY - Abnormal; Notable for the following components:   Troponin T High Sensitivity 143 (*)    All other components within normal limits  CK  URINALYSIS, W/ REFLEX TO CULTURE (INFECTION SUSPECTED)  TROPONIN T, HIGH SENSITIVITY     MDM  Glucose in the 400s.  Given 1 L of IV fluids.  EKG with nonspecific changes.  Repeat EKG with no significant change when compared to prior.   Troponin mildly elevated at 100s.  On chart review does have a history of elevated troponin in the 100s in the past.  No chest pain or shortness of breath at this time.  Hyperglycemia with CO2 of 18, elevated beta hydroxybutyrate's and elevated anion gap of 19.  Will get an In-N-Out for urine catheter.  No signs of rhabdomyolysis.  Started on insulin  with potassium added to fluids for concern for possible DKA.  Consulted hospitalist for admission.     PROCEDURES:  Critical Care performed: yes  .Critical Care  Performed by: Suzanne Kirsch, MD Authorized by: Suzanne Kirsch, MD   Critical care provider statement:    Critical care time (minutes):  30   Critical care time was exclusive of:  Separately billable procedures and treating other patients   Critical care was necessary to treat or prevent imminent or life-threatening deterioration of the following conditions:  Metabolic crisis   Critical care was time spent personally by me on the following activities:  Development of treatment plan with patient or surrogate, discussions with consultants, evaluation of patient's response to treatment, examination of patient, ordering and review of laboratory studies, ordering and review of radiographic studies,  ordering and performing treatments and interventions, pulse oximetry, re-evaluation of patient's condition and review of old charts   Care discussed with: admitting provider     Patient's presentation is most consistent with acute presentation with potential threat to life or bodily function.   MEDICATIONS ORDERED IN ED: Medications  insulin  regular, human (MYXREDLIN ) 100 units/ 100  mL infusion (has no administration in time range)  lactated ringers  infusion (has no administration in time range)  dextrose  5 % in lactated ringers  infusion (has no administration in time range)  dextrose  50 % solution 0-50 mL (has no administration in time range)  potassium chloride  10 mEq in 100 mL IVPB (has no administration in time range)  lactated ringers  bolus 500 mL (0 mLs Intravenous Stopped 07/04/24 2152)  lactated ringers  bolus 1,000 mL (1,000 mLs Intravenous New Bag/Given 07/04/24 2252)    FINAL CLINICAL IMPRESSION(S) / ED DIAGNOSES   Final diagnoses:  Diabetic ketoacidosis without coma associated with other specified diabetes mellitus (HCC)  Elevated troponin  Weakness     Rx / DC Orders   ED Discharge Orders     None        Note:  This document was prepared using Dragon voice recognition software and may include unintentional dictation errors.   Suzanne Kirsch, MD 07/04/24 [phone]

## 2024-07-04 NOTE — ED Triage Notes (Signed)
 Pt to ED by [facility] from home. Pt's wife called for weakness.  Pt laid down on the floor at home d/t feeling weak. Pt bedbound at baseline, hx of dementia.  Baseline sleeping 14 hours a day.  BGL 417, pt has not had his night time medications.  119/76, 100% on room air, HR 80.

## 2024-07-04 NOTE — ED Notes (Signed)
 Dr. Suzanne made aware of pt's critical trop of 147.

## 2024-07-05 ENCOUNTER — Other Ambulatory Visit: Payer: Self-pay

## 2024-07-05 DIAGNOSIS — R739 Hyperglycemia, unspecified: Secondary | ICD-10-CM | POA: Diagnosis present

## 2024-07-05 DIAGNOSIS — N179 Acute kidney failure, unspecified: Secondary | ICD-10-CM | POA: Diagnosis not present

## 2024-07-05 DIAGNOSIS — Z7901 Long term (current) use of anticoagulants: Secondary | ICD-10-CM

## 2024-07-05 DIAGNOSIS — E1110 Type 2 diabetes mellitus with ketoacidosis without coma: Secondary | ICD-10-CM | POA: Diagnosis not present

## 2024-07-05 DIAGNOSIS — R531 Weakness: Secondary | ICD-10-CM | POA: Diagnosis present

## 2024-07-05 DIAGNOSIS — E1310 Other specified diabetes mellitus with ketoacidosis without coma: Secondary | ICD-10-CM

## 2024-07-05 DIAGNOSIS — Z515 Encounter for palliative care: Secondary | ICD-10-CM | POA: Diagnosis not present

## 2024-07-05 DIAGNOSIS — R54 Age-related physical debility: Secondary | ICD-10-CM | POA: Insufficient documentation

## 2024-07-05 DIAGNOSIS — D509 Iron deficiency anemia, unspecified: Secondary | ICD-10-CM

## 2024-07-05 DIAGNOSIS — I5022 Chronic systolic (congestive) heart failure: Secondary | ICD-10-CM

## 2024-07-05 DIAGNOSIS — F0390 Unspecified dementia without behavioral disturbance: Secondary | ICD-10-CM | POA: Insufficient documentation

## 2024-07-05 LAB — CBC
HCT: 31.3 % — ABNORMAL LOW (ref 39.0–52.0)
Hemoglobin: 10.1 g/dL — ABNORMAL LOW (ref 13.0–17.0)
MCH: 28.6 pg (ref 26.0–34.0)
MCHC: 32.3 g/dL (ref 30.0–36.0)
MCV: 88.7 fL (ref 80.0–100.0)
Platelets: 210 K/uL (ref 150–400)
RBC: 3.53 MIL/uL — ABNORMAL LOW (ref 4.22–5.81)
RDW: 18.6 % — ABNORMAL HIGH (ref 11.5–15.5)
WBC: 10.5 K/uL (ref 4.0–10.5)
nRBC: 0.2 % (ref 0.0–0.2)

## 2024-07-05 LAB — BASIC METABOLIC PANEL WITH GFR
Anion gap: 14 (ref 5–15)
Anion gap: 17 — ABNORMAL HIGH (ref 5–15)
Anion gap: 15 (ref 5–15)
BUN: 40 mg/dL — ABNORMAL HIGH (ref 8–23)
BUN: 37 mg/dL — ABNORMAL HIGH (ref 8–23)
BUN: 39 mg/dL — ABNORMAL HIGH (ref 8–23)
CO2: 20 mmol/L — ABNORMAL LOW (ref 22–32)
CO2: 16 mmol/L — ABNORMAL LOW (ref 22–32)
CO2: 18 mmol/L — ABNORMAL LOW (ref 22–32)
Calcium: 8.6 mg/dL — ABNORMAL LOW (ref 8.9–10.3)
Calcium: 8.6 mg/dL — ABNORMAL LOW (ref 8.9–10.3)
Calcium: 9.2 mg/dL (ref 8.9–10.3)
Chloride: 106 mmol/L (ref 98–111)
Chloride: 106 mmol/L (ref 98–111)
Chloride: 106 mmol/L (ref 98–111)
Creatinine, Ser: 1.22 mg/dL (ref 0.61–1.24)
Creatinine, Ser: 1.21 mg/dL (ref 0.61–1.24)
Creatinine, Ser: 1.21 mg/dL (ref 0.61–1.24)
GFR, Estimated: 60 mL/min — ABNORMAL LOW (ref >60)
GFR, Estimated: >60 mL/min (ref >60)
GFR, Estimated: >60 mL/min (ref >60)
Glucose, Bld: 188 mg/dL — ABNORMAL HIGH (ref 70–99)
Glucose, Bld: 105 mg/dL — ABNORMAL HIGH (ref 70–99)
Glucose, Bld: 202 mg/dL — ABNORMAL HIGH (ref 70–99)
Potassium: 4.1 mmol/L (ref 3.5–5.1)
Potassium: 4.2 mmol/L (ref 3.5–5.1)
Potassium: 4.3 mmol/L (ref 3.5–5.1)
Sodium: 139 mmol/L (ref 135–145)
Sodium: 140 mmol/L (ref 135–145)
Sodium: 139 mmol/L (ref 135–145)

## 2024-07-05 LAB — GLUCOSE, CAPILLARY
Glucose-Capillary: 199 mg/dL — ABNORMAL HIGH (ref 70–99)
Glucose-Capillary: 146 mg/dL — ABNORMAL HIGH (ref 70–99)
Glucose-Capillary: 112 mg/dL — ABNORMAL HIGH (ref 70–99)
Glucose-Capillary: 83 mg/dL (ref 70–99)
Glucose-Capillary: 69 mg/dL — ABNORMAL LOW (ref 70–99)
Glucose-Capillary: 115 mg/dL — ABNORMAL HIGH (ref 70–99)
Glucose-Capillary: 193 mg/dL — ABNORMAL HIGH (ref 70–99)
Glucose-Capillary: 226 mg/dL — ABNORMAL HIGH (ref 70–99)
Glucose-Capillary: 173 mg/dL — ABNORMAL HIGH (ref 70–99)
Glucose-Capillary: 161 mg/dL — ABNORMAL HIGH (ref 70–99)

## 2024-07-05 LAB — URINALYSIS, W/ REFLEX TO CULTURE (INFECTION SUSPECTED)
Bilirubin Urine: NEGATIVE
Glucose, UA: >=500 mg/dL — AB
Ketones, ur: NEGATIVE mg/dL
Nitrite: NEGATIVE
Protein, ur: 100 mg/dL — AB
Specific Gravity, Urine: 1.02 (ref 1.005–1.030)
pH: 5 (ref 5.0–8.0)

## 2024-07-05 LAB — MRSA NEXT GEN BY PCR, NASAL: MRSA by PCR Next Gen: NOT DETECTED

## 2024-07-05 LAB — CBG MONITORING, ED: Glucose-Capillary: 231 mg/dL — ABNORMAL HIGH (ref 70–99)

## 2024-07-05 MED ORDER — ORAL CARE MOUTH RINSE: 15.0000 mL | OROMUCOSAL | Status: DC | PRN

## 2024-07-05 MED ORDER — APIXABAN 5 MG PO TABS
5.0000 mg | ORAL_TABLET | Freq: Two times a day (BID) | ORAL | Status: DC
  Administered 2024-07-05 – 2024-07-12 (×16): 5 mg via ORAL
  Filled 2024-07-05 (×17): qty 1

## 2024-07-05 MED ORDER — DONEPEZIL HCL 5 MG PO TABS
10.0000 mg | ORAL_TABLET | Freq: Every day | ORAL | Status: DC
  Administered 2024-07-05 – 2024-07-17 (×13): 10 mg via ORAL
  Filled 2024-07-05 (×13): qty 2

## 2024-07-05 MED ORDER — ACETAMINOPHEN 650 MG RE SUPP: 650.0000 mg | Freq: Four times a day (QID) | RECTAL | Status: DC | PRN

## 2024-07-05 MED ORDER — SODIUM CHLORIDE 0.9 % IV SOLN
1.0000 g | INTRAVENOUS | Status: DC
  Administered 2024-07-05 – 2024-07-09 (×5): 1 g via INTRAVENOUS
  Filled 2024-07-05 (×5): qty 10

## 2024-07-05 MED ORDER — DEXTROSE 50 % IV SOLN
12.5000 g | INTRAVENOUS | Status: AC
  Administered 2024-07-05: 12.5 g via INTRAVENOUS

## 2024-07-05 MED ORDER — ACETAMINOPHEN 325 MG PO TABS
650.0000 mg | ORAL_TABLET | Freq: Four times a day (QID) | ORAL | Status: DC | PRN
  Administered 2024-07-08 – 2024-07-09 (×3): 650 mg via ORAL
  Filled 2024-07-05 (×3): qty 2

## 2024-07-05 MED ORDER — ATORVASTATIN CALCIUM 20 MG PO TABS
40.0000 mg | ORAL_TABLET | Freq: Every day | ORAL | Status: DC
  Administered 2024-07-05 – 2024-07-17 (×13): 40 mg via ORAL
  Filled 2024-07-05 (×13): qty 2

## 2024-07-05 MED ORDER — ONDANSETRON HCL 4 MG/2ML IJ SOLN: 4.0000 mg | Freq: Four times a day (QID) | INTRAMUSCULAR | Status: DC | PRN

## 2024-07-05 MED ORDER — HALOPERIDOL LACTATE 5 MG/ML IJ SOLN
1.0000 mg | Freq: Four times a day (QID) | INTRAMUSCULAR | Status: DC | PRN
  Administered 2024-07-05: 1 mg via INTRAVENOUS
  Filled 2024-07-05: qty 1

## 2024-07-05 MED ORDER — INSULIN ASPART 100 UNIT/ML IJ SOLN
0.0000 [IU] | INTRAMUSCULAR | Status: DC
  Administered 2024-07-05 (×2): 2 [IU] via SUBCUTANEOUS
  Administered 2024-07-05: 3 [IU] via SUBCUTANEOUS
  Administered 2024-07-05: 1 [IU] via SUBCUTANEOUS
  Administered 2024-07-05 – 2024-07-06 (×3): 2 [IU] via SUBCUTANEOUS
  Administered 2024-07-06: 5 [IU] via SUBCUTANEOUS
  Administered 2024-07-06 – 2024-07-07 (×2): 3 [IU] via SUBCUTANEOUS
  Administered 2024-07-07: 2 [IU] via SUBCUTANEOUS
  Administered 2024-07-07: 9 [IU] via SUBCUTANEOUS
  Administered 2024-07-07: 2 [IU] via SUBCUTANEOUS
  Administered 2024-07-08 (×2): 5 [IU] via SUBCUTANEOUS
  Administered 2024-07-08 (×3): 2 [IU] via SUBCUTANEOUS
  Administered 2024-07-08: 7 [IU] via SUBCUTANEOUS
  Administered 2024-07-09 (×2): 1 [IU] via SUBCUTANEOUS
  Administered 2024-07-09: 2 [IU] via SUBCUTANEOUS
  Administered 2024-07-10: 5 [IU] via SUBCUTANEOUS
  Administered 2024-07-10: 3 [IU] via SUBCUTANEOUS
  Administered 2024-07-10: 1 [IU] via SUBCUTANEOUS
  Filled 2024-07-05: qty 3
  Filled 2024-07-05: qty 2
  Filled 2024-07-05: qty 1
  Filled 2024-07-05: qty 7
  Filled 2024-07-05 (×2): qty 3
  Filled 2024-07-05 (×2): qty 2
  Filled 2024-07-05: qty 3
  Filled 2024-07-05 (×2): qty 1
  Filled 2024-07-05 (×3): qty 2
  Filled 2024-07-05 (×2): qty 5
  Filled 2024-07-05: qty 2
  Filled 2024-07-05: qty 5
  Filled 2024-07-05: qty 2
  Filled 2024-07-05: qty 1
  Filled 2024-07-05: qty 2
  Filled 2024-07-05: qty 5
  Filled 2024-07-05 (×2): qty 2
  Filled 2024-07-05: qty 5
  Filled 2024-07-05: qty 9

## 2024-07-05 MED ORDER — CHLORHEXIDINE GLUCONATE CLOTH 2 % EX PADS: 6.0000 | MEDICATED_PAD | Freq: Every day | CUTANEOUS | Status: DC

## 2024-07-05 MED ORDER — SODIUM CHLORIDE 0.9 % IV SOLN: INTRAVENOUS | Status: AC

## 2024-07-05 MED ORDER — ONDANSETRON HCL 4 MG PO TABS
4.0000 mg | ORAL_TABLET | Freq: Four times a day (QID) | ORAL | Status: DC | PRN
  Administered 2024-07-10: 4 mg via ORAL
  Filled 2024-07-05: qty 1

## 2024-07-05 NOTE — Assessment & Plan Note (Signed)
 On iron transfusion, last transfusion 11/17 Hemoglobin fairly stable

## 2024-07-05 NOTE — Assessment & Plan Note (Signed)
 Holding antihypertensives due to soft blood pressure.

## 2024-07-05 NOTE — Assessment & Plan Note (Signed)
 History of LV thrombus Continue apixaban 

## 2024-07-05 NOTE — Assessment & Plan Note (Addendum)
 Clinically dry at this time Holding GDMT meds tonight due to soft blood pressure Monitor for fluid overload with IV fluids to treat dehydration

## 2024-07-05 NOTE — Plan of Care (Signed)
 Discussed with patient and wife plan of care for the shift, pain management and admission questions with some teach back displayed.  Problem: Education: Goal: Knowledge of General Education information will improve Description: Including pain rating scale, medication(s)/side effects and non-pharmacologic comfort measures Outcome: Progressing   Problem: Health Behavior/Discharge Planning: Goal: Ability to manage health-related needs will improve Outcome: Not Progressing

## 2024-07-05 NOTE — H&P (Signed)
 History and Physical    Patient: Luke Chen FMW:[id number] DOB: [birth year] DOA: 07/04/2024 DOS: the patient was seen and examined on 07/05/2024 PCP: P.A., Sheralyn Flock, MD  Patient coming from: Home  Chief Complaint: weakness   HPI: Luke Chen is a 83 y.o. 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  He was brought in by EMS from home due to concerns for weakness.  Wife at the bedside who gives a history, states that he had been declining for some time, sleeping for most of the day.  States the iron infusions have not been helping him as expected.  Due to increased concern she called EMS to bring him in.  Triage note states blood glucose of 417. In the ED, very frail-appearing male.  Initial vitals unremarkable except for somewhat soft blood pressure with systolic 95-1 02.8 1 Labs notable for WBC 12.1, hemoglobin 10.7, down from 11.2 on 10/8 Blood glucose 294 with anion gap 19 and bicarb 18.  VBG unremarkable and beta hydroxybutyric acid 0.39 Creatinine 1.36 up from baseline of 0.9 on 10/7 Troponin 143--128 VBG unremarkable CK1 48 UA pending. EKG showing sinus at 92 with no acute ST-T wave changes Chest x-ray nonacute, CT head nonacute  Patient was started on an insulin  drip due to concern for DKA along with fluids and electrolyte repletion per DKA  Admission requested      Past Medical History:  Diagnosis Date   Dementia (HCC)    Diabetes mellitus without complication (HCC)    Hypertension    History reviewed. No pertinent surgical history. Social History:  reports that he has never smoked. He has never used smokeless tobacco. He reports that he does not drink alcohol and does not use  drugs.  Allergies  Allergen Reactions   Gramineae Pollens Other (See Comments)    12/14/21 Patient's wife states don't know if it's his medications or allergies but he gets a runny nose, Samuel Cahill MD made aware   Other Other (See Comments)    12/14/21 Patient's wife states don't know if it's his medications or allergies but he gets a runny nose, Samuel Cahill MD made aware    History reviewed. No pertinent family history.  Prior to Admission medications   Medication Sig Start Date End Date Taking? Authorizing Provider  ascorbic acid (VITAMIN C) 1000 MG tablet Take 1,000 mg by mouth daily at 2 PM.    [provider]  atorvastatin  (LIPITOR) 40 MG tablet Take 40 mg by mouth daily.    [provider]  clopidogrel  (PLAVIX ) 75 MG tablet Take 75 mg by mouth daily. 11/20/21   [provider]  cyanocobalamin (VITAMIN B12) 1000 MCG tablet Take 1,000 mcg by mouth once a week.    [provider]  donepezil  (ARICEPT ) 10 MG tablet TAKE 1 TABLET BY MOUTH EVERY DAY 06/14/22   Flock Sheralyn, MD  ELIQUIS  5 MG TABS tablet Take 5 mg by mouth 2 (two) times daily. 12/26/21   [provider]  glimepiride  (AMARYL ) 2 MG tablet Take 1 tablet (2 mg total) by mouth daily at 6 (six) AM. 02/01/22   Flock Sheralyn, MD  lisinopril  (ZESTRIL ) 5 MG tablet Take 1 tablet (5 mg total) by mouth daily. 10/15/20   Flock Sheralyn, MD  metFORMIN  (GLUCOPHAGE ) 1000 MG tablet TAKE 1 TABLET BY MOUTH TWICE A DAY 06/08/22   Masoud, Javed, MD  metoprolol  succinate (TOPROL -XL) 25 MG 24 hr tablet Take 0.5 tablets by mouth daily. 12/11/22   [provider]    Physical Exam: Vitals:   07/04/24 2057 07/04/24 2156 07/04/24 2300 07/04/24 2347  BP: 102/63 99/74 95/72    Pulse:  65 94   Resp: 19 14 (!) 21   Temp: 97.7 F (36.5 C)     TempSrc: Axillary     SpO2: 96% 95% 95%   Weight:    54.4 kg   Physical Exam Vitals and nursing note reviewed.  Constitutional:      General: He is not in  acute distress.    Comments: Carollynn elderly male, lying in fetal position on right side, awake, making eye contact.  Not answering questions  HENT:     Head: Normocephalic and atraumatic.  Cardiovascular:     Rate and Rhythm: Normal rate and regular rhythm.     Heart sounds: Normal heart sounds.  Pulmonary:     Effort: Pulmonary effort is normal.     Breath sounds: Normal breath sounds.  Abdominal:     Palpations: Abdomen is soft.     Tenderness: There is no abdominal tenderness.  Neurological:     General: No focal deficit present.     Labs on Admission: I have personally reviewed following labs and imaging studies  CBC: Recent Labs  Lab 07/04/24 2102  WBC 12.1*  HGB 10.7*  HCT 33.0*  MCV 88.5  PLT 230   Basic Metabolic Panel: Recent Labs  Lab 07/04/24 2102 07/05/24 0100  NA 138 139  K 4.4 4.1  CL 101 106  CO2 18* 20*  GLUCOSE 294* 188*  BUN 37* 40*  CREATININE 1.36* 1.22  CALCIUM  9.1 8.6*   GFR: CrCl cannot be calculated (Unknown ideal weight.). Liver Function Tests: No results for input(s): AST, ALT, ALKPHOS, BILITOT, PROT, ALBUMIN in the last 168 hours. No results for input(s): LIPASE, AMYLASE in the last 168 hours. No results for input(s): AMMONIA in the last 168 hours. Coagulation Profile: No results for input(s): INR, PROTIME in the last 168 hours. Cardiac Enzymes: Recent Labs  Lab 07/04/24 2102  CKTOTAL 148   BNP (last 3 results) No results for input(s): PROBNP in the last 8760 hours. HbA1C: No results for input(s): HGBA1C in the last 72 hours. CBG: Recent Labs  Lab 07/04/24 2342 07/05/24 0047  GLUCAP 266* 231*   Lipid Profile: No results for input(s): CHOL, HDL, LDLCALC, TRIG, CHOLHDL, LDLDIRECT in the last 72 hours. Thyroid  Function Tests: No results for input(s): TSH, T4TOTAL, FREET4, T3FREE, THYROIDAB in the last 72 hours. Anemia Panel: No results for input(s): VITAMINB12,  FOLATE, FERRITIN, TIBC, IRON, RETICCTPCT in the last 72 hours. Urine analysis:    Component Value Date/Time   COLORURINE YELLOW (A) 12/18/2023 0645   APPEARANCEUR TURBID (A) 12/18/2023 0645   LABSPEC 1.017 12/18/2023 0645   PHURINE 7.0 12/18/2023 0645   GLUCOSEU >=500 (A) 12/18/2023 0645   HGBUR SMALL (A) 12/18/2023 0645   BILIRUBINUR NEGATIVE 12/18/2023 0645   BILIRUBINUR neg 01/02/2022 1407   KETONESUR 5 (A) 12/18/2023 0645   PROTEINUR 100 (A) 12/18/2023 0645   UROBILINOGEN negative (A) 01/02/2022 1407   NITRITE NEGATIVE 12/18/2023 0645   LEUKOCYTESUR LARGE (A) 12/18/2023 0645    Radiological Exams on Admission: DG Chest Portable 1 View Result Date: 07/04/2024 EXAM: 1 VIEW(S) XRAY OF THE CHEST  07/04/2024 09:34:00 PM COMPARISON: 12/19/2023 CLINICAL HISTORY: Dyspnea, AMS. FINDINGS: LUNGS AND PLEURA: Shallow inspiration. Lungs are clear. Pulmonary vascularity is normal. HEART AND MEDIASTINUM: Heart size is normal. Calcification of the aorta. BONES AND SOFT TISSUES: Degenerative changes in the spine. No acute osseous abnormality. IMPRESSION: 1. No acute cardiopulmonary process. Electronically signed by: Elsie Gravely MD 07/04/2024 09:51 PM EST RP Workstation: [id number]   CT Head Wo Contrast Result Date: 07/04/2024 CLINICAL DATA:  Altered level of consciousness, weakness EXAM: CT HEAD WITHOUT CONTRAST TECHNIQUE: Contiguous axial images were obtained from the base of the skull through the vertex without intravenous contrast. RADIATION DOSE REDUCTION: This exam was performed according to the departmental dose-optimization program which includes automated exposure control, adjustment of the mA and/or kV according to patient size and/or use of iterative reconstruction technique. COMPARISON:  12/18/2023 FINDINGS: Brain: Stable chronic ischemic changes of the bilateral parietooccipital regions, periventricular white matter, and basal ganglia. No evidence of acute infarct or hemorrhage.  Lateral ventricles and midline structures are unremarkable. No acute extra-axial fluid collections. No mass effect. Vascular: No hyperdense vessel or unexpected calcification. Skull: Normal. Negative for fracture or focal lesion. Sinuses/Orbits: No acute finding. Other: None. IMPRESSION: 1. Stable head CT, no acute intracranial process. Electronically Signed   By: Ozell Daring M.D.   On: 07/04/2024 21:37   Data Reviewed for HPI: Relevant notes from primary care and specialist visits, past discharge summaries as available in EHR, including Care Everywhere. Prior diagnostic testing as pertinent to current admission diagnoses Updated medications and problem lists for reconciliation ED course, including vitals, labs, imaging, treatment and response to treatment Triage notes, nursing and pharmacy notes and ED provider's notes Notable results as noted above in HPI      Assessment and Plan: * Generalized weakness Leukocytosis Dehydration/hypotension Suspect multifactorial, including dehydration, possible underlying infection, hyperglycemia, with background of anemia and frailty  Troponin elevated but downtrending so low suspicion for ACS Follow-up urinalysis to evaluate for UTI Fall and aspiration precautions Can consider PT OT eval   Hyperglycemia Transitioning off insulin  infusion that was started in the ED based on repeat BMP Sliding scale insulin  coverage after transition  Elevated troponin CAD s/p stent to LCx and LAD Suspect demand ischemia.  Troponin 143-> 128  AKI (acute kidney injury) Creatinine 1.36 up from baseline of 0.9 on 10/7 Cautious IV hydration, monitoring for fluid overload in view of history of HFrEF Monitor renal function and avoid nephrotoxins  Chronic HFrEF (heart failure with reduced ejection fraction) (HCC) Clinically dry at this time Holding GDMT meds tonight due to soft blood pressure Monitor for fluid overload with IV fluids to treat  dehydration  Chronic anticoagulation History of LV thrombus Continue apixaban   IDA (iron deficiency anemia) On iron transfusion, last transfusion 11/17 Hemoglobin fairly stable  Dementia (HCC) Frailty Delirium precautions Continue Aricept  Fall and aspiration precautions  Essential hypertension Holding antihypertensives due to soft blood pressure    DVT prophylaxis: apixaban   Consults: none  Advance Care Planning:  DNR/DNI  Family Communication: wife at bedside  Disposition Plan: Back to previous home environment  Severity of Illness: The appropriate patient status for this patient is INPATIENT. Inpatient status is judged to be reasonable and necessary in order to provide the required intensity of service to ensure the patient's safety. The patient's presenting symptoms, physical exam findings, and initial radiographic and laboratory data in the context of their chronic comorbidities is felt to place them at high risk for further clinical deterioration. Furthermore, it is not  anticipated that the patient will be medically stable for discharge from the hospital within 2 midnights of admission.   * I certify that at the point of admission it is my clinical judgment that the patient will require inpatient hospital care spanning beyond 2 midnights from the point of admission due to high intensity of service, high risk for further deterioration and high frequency of surveillance required.*  Author: Delayne LULLA Solian, MD 07/05/2024 2:26 AM  For on call review [email or web address].

## 2024-07-05 NOTE — Assessment & Plan Note (Addendum)
 Creatinine 1.36 up from baseline of 0.9 on 10/7 Cautious IV hydration, monitoring for fluid overload in view of history of HFrEF Monitor renal function and avoid nephrotoxins

## 2024-07-05 NOTE — Assessment & Plan Note (Addendum)
 Leukocytosis Dehydration/hypotension Suspect multifactorial, including dehydration, possible underlying infection, hyperglycemia, with background of anemia and frailty  Troponin elevated but downtrending so low suspicion for ACS Follow-up urinalysis to evaluate for UTI Fall and aspiration precautions Can consider PT OT eval

## 2024-07-05 NOTE — Assessment & Plan Note (Addendum)
 Frailty Delirium precautions Continue Aricept  Fall and aspiration precautions

## 2024-07-05 NOTE — Assessment & Plan Note (Signed)
 Transitioning off insulin  infusion that was started in the ED based on repeat BMP Sliding scale insulin  coverage after transition

## 2024-07-05 NOTE — Progress Notes (Signed)
 Patient is restless, pulling at lines and tele leads. Patient gets agitated when redirected. MD aware. PRN Haldol  given.

## 2024-07-05 NOTE — Assessment & Plan Note (Signed)
 CAD s/p stent to LCx and LAD Suspect demand ischemia.  Troponin 143-> 128

## 2024-07-05 NOTE — IPAL (Signed)
  Interdisciplinary Goals of Care Family Meeting   Date carried out: 07/05/2024  Location of the meeting: Bedside  Member's involved: Physician and Family Member or next of kin  Durable Power of Attorney or environmental health practitioner: wife    Discussion: We discussed goals of care for Luke Chen .   I have reviewed medical records including EPIC notes, labs and imaging. Discussed major active diagnoses, plan of care, natural trajectory, prognosis, GOC, EOL wishes, disposition and options including Full code/DNI/DNR and the concept of comfort care if DNR is elected. Questions and concerns were addressed.  Election for DNR/DNI status.   Code status:   Code Status: Limited: Do not attempt resuscitation (DNR) -DNR-LIMITED -Do Not Intubate/DNI    Disposition: Continue current acute care  Time spent for the meeting: 15    Luke LULLA Solian, MD  07/05/2024, 2:32 AM

## 2024-07-05 NOTE — Progress Notes (Signed)
 Progress Note   Patient: Luke Chen FMW:[id number] DOB: 1941/07/02 DOA: 07/04/2024     0 DOS: the patient was seen and examined on 07/05/2024   Brief hospital course:  From HPI Luke Chen is a 83 y.o. 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  He was brought in by EMS from home due to concerns for weakness.  Wife at the bedside who gives a history, states that he had been declining for some time, sleeping for most of the day.  States the iron infusions have not been helping him as expected.  Due to increased concern she called EMS to bring him in.  Triage note states blood glucose of 417. In the ED, very frail-appearing male.  Initial vitals unremarkable except for somewhat soft blood pressure with systolic 95-1 02.8 1 Labs notable for WBC 12.1, hemoglobin 10.7, down from 11.2 on 10/8 Blood glucose 294 with anion gap 19 and bicarb 18.  VBG unremarkable and beta hydroxybutyric acid 0.39 Creatinine 1.36 up from baseline of 0.9 on 10/7 Troponin 143--128 VBG unremarkable CK1 48 UA pending. EKG showing sinus at 92 with no acute ST-T wave changes Chest x-ray nonacute, CT head nonacute   Patient was started on an insulin  drip due to concern for DKA along with fluids and electrolyte repletion per DKA   Admission requested    Assessment and Plan: Generalized weakness Possible urinary tract infection Dehydration/hypotension Suspect multifactorial, including dehydration, possible underlying infection, hyperglycemia, with background of anemia and frailty  Troponin elevated but downtrending so low suspicion for ACS Urinalysis shows possible UTI Patient initiated on ceftriaxone  Fall and aspiration  precautions PT OT consulted     Hyperglycemia Patient has been weaned off insulin  drip Continue as needed insulin    Elevated troponin CAD s/p stent to LCx and LAD Suspect demand ischemia.  Troponin 143-> 128   AKI (acute kidney injury) Creatinine 1.36 up from baseline of 0.9 on 10/7 Cautious IV hydration, monitoring for fluid overload in view of history of HFrEF Monitor renal function and avoid nephrotoxins   Chronic HFrEF (heart failure with reduced ejection fraction) (HCC) Clinically dry at this time Holding GDMT meds tonight due to soft blood pressure Monitor for fluid overload with IV fluids to treat dehydration   Chronic anticoagulation History of LV thrombus Continue apixaban    IDA (iron deficiency anemia) On iron transfusion, last transfusion 11/17 Hemoglobin fairly stable   Dementia (HCC) Frailty Delirium precautions Continue Aricept  Fall and aspiration precautions One-to-one precaution ordered   Essential hypertension Holding antihypertensives due to soft blood pressure       DVT prophylaxis: apixaban    Consults: none   Advance Care Planning:  DNR/DNI   Family Communication: wife at bedside   Disposition Plan: Back to previous home environment  Physical Exam:  Vitals and nursing note reviewed.  Constitutional: Laying in bed in no acute distress HENT:     Head: Normocephalic and atraumatic.  Cardiovascular:     Rate and Rhythm: Normal rate and regular rhythm.     Heart sounds: Normal heart sounds.  Pulmonary:     Effort: Pulmonary effort is normal.     Breath sounds: Normal breath sounds.  Abdominal:     Palpations: Abdomen is soft.     Tenderness: There  is no abdominal tenderness.  Neurological: Sedated and symptoms of dementia    General: No focal deficit present.  Vitals:   07/05/24 1131 07/05/24 1138 07/05/24 1200 07/05/24 1452  BP: (!) 135/106   (!) 148/95  Pulse: (!) 46 98  (!) 137  Resp:   10 16  Temp: 98.8 F (37.1 C)      TempSrc:      SpO2:  100%    Weight:        Data Reviewed:  I have reviewed patient chest x-ray that did not show any acute pathology    Latest Ref Rng & Units 07/05/2024    6:04 AM 07/04/2024    9:02 PM 12/19/2023    4:54 AM  CBC  WBC 4.0 - 10.5 K/uL 10.5  12.1  6.6   Hemoglobin 13.0 - 17.0 g/dL 89.8  89.2  88.6   Hematocrit 39.0 - 52.0 % 31.3  33.0  35.3   Platelets 150 - 400 K/uL 210  230  114        Latest Ref Rng & Units 07/05/2024   11:36 AM 07/05/2024    3:50 AM 07/05/2024    1:00 AM  BMP  Glucose 70 - 99 mg/dL 797  894  811   BUN 8 - 23 mg/dL 39  37  40   Creatinine 0.61 - 1.24 mg/dL 8.78  8.78  8.77   Sodium 135 - 145 mmol/L 139  140  139   Potassium 3.5 - 5.1 mmol/L 4.3  4.2  4.1   Chloride 98 - 111 mmol/L 106  106  106   CO2 22 - 32 mmol/L 18  16  20    Calcium  8.9 - 10.3 mg/dL 9.2  8.6  8.6      Family Communication: No family at bedside  Time spent: 53 minutes  Author: Drue ONEIDA Potter, MD 07/05/2024 4:03 PM  For on call review [email or web address].

## 2024-07-05 NOTE — Progress Notes (Signed)
 Patient admitted to PCU from ICU. Upon arrival, patient placed on continuous cardiac telemetry monitoring. Vital signs obtained and documented. Patient oriented to room, call bell system, and unit routines. Safety measures implemented: bed in lowest position, call bell in reach, and side rails up x3. Patient repositioned for comfort and skin protection. Initial head-to-toes assessment completed. Patient resting comfortably in bed, no acute distress noted at this time.

## 2024-07-05 NOTE — Plan of Care (Addendum)
 This patient remains on AR-2A as of time of writing. The patient is AA+Ox1 only. The patient is receiving supplemental O2 at 2 L / min. This patient's admission profile is completed overnight by this RN. Wounds were noted to the patient's left elbow, right elbow, and left forearm; each are documented per protocol. Wound care orders are entered per BPA. DNR armband is placed on the patient per protocol. Safety sitter remains at bedside and the sitter order is modified to reflect the bedside sitter rather than tele-sitter. The patient's orders for SSI are re-timed by Pharmacy overnight to align with the patient's orders for CBG checks. MEWS triggered erroneously overnight; no escalation required. Oral care protocol initiated overnight was well.    Problem: Education: Goal: Knowledge of General Education information will improve Description: Including pain rating scale, medication(s)/side effects and non-pharmacologic comfort measures Outcome: Progressing   Problem: Health Behavior/Discharge Planning: Goal: Ability to manage health-related needs will improve Outcome: Progressing   Problem: Clinical Measurements: Goal: Ability to maintain clinical measurements within normal limits will improve Outcome: Progressing Goal: Will remain free from infection Outcome: Progressing Goal: Diagnostic test results will improve Outcome: Progressing Goal: Respiratory complications will improve Outcome: Progressing Goal: Cardiovascular complication will be avoided Outcome: Progressing   Problem: Activity: Goal: Risk for activity intolerance will decrease Outcome: Progressing   Problem: Nutrition: Goal: Adequate nutrition will be maintained Outcome: Progressing   Problem: Coping: Goal: Level of anxiety will decrease Outcome: Progressing   Problem: Elimination: Goal: Will not experience complications related to bowel motility Outcome: Progressing Goal: Will not experience complications related to  urinary retention Outcome: Progressing   Problem: Pain Managment: Goal: General experience of comfort will improve and/or be controlled Outcome: Progressing   Problem: Safety: Goal: Ability to remain free from injury will improve Outcome: Progressing   Problem: Skin Integrity: Goal: Risk for impaired skin integrity will decrease Outcome: Progressing   Problem: Education: Goal: Ability to describe self-care measures that may prevent or decrease complications (Diabetes Survival Skills Education) will improve Outcome: Progressing Goal: Individualized Educational Video(s) Outcome: Progressing   Problem: Coping: Goal: Ability to adjust to condition or change in health will improve Outcome: Progressing   Problem: Fluid Volume: Goal: Ability to maintain a balanced intake and output will improve Outcome: Progressing   Problem: Health Behavior/Discharge Planning: Goal: Ability to identify and utilize available resources and services will improve Outcome: Progressing Goal: Ability to manage health-related needs will improve Outcome: Progressing   Problem: Metabolic: Goal: Ability to maintain appropriate glucose levels will improve Outcome: Progressing   Problem: Nutritional: Goal: Maintenance of adequate nutrition will improve Outcome: Progressing Goal: Progress toward achieving an optimal weight will improve Outcome: Progressing   Problem: Skin Integrity: Goal: Risk for impaired skin integrity will decrease Outcome: Progressing   Problem: Tissue Perfusion: Goal: Adequacy of tissue perfusion will improve Outcome: Progressing   Problem: Education: Goal: Ability to demonstrate management of disease process will improve Outcome: Progressing Goal: Ability to verbalize understanding of medication therapies will improve Outcome: Progressing Goal: Individualized Educational Video(s) Outcome: Progressing   Problem: Activity: Goal: Capacity to carry out activities will  improve Outcome: Progressing   Problem: Cardiac: Goal: Ability to achieve and maintain adequate cardiopulmonary perfusion will improve Outcome: Progressing

## 2024-07-05 NOTE — Progress Notes (Signed)
 Informed MD of respiratory rate. Haldol  orders d/c'd.

## 2024-07-05 NOTE — Evaluation (Signed)
 Occupational Therapy Evaluation Patient Details Name: Luke Chen MRN: [id number] DOB: 11/15/1940 Today's Date: 07/05/2024   History of Present Illness   Vin Yonke is a 83 y.o. 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  He was brought in by EMS from home due to concerns for weakness.  Wife at the bedside who gives a history, states that he had been declining for some time, sleeping for most of the day.  States the iron infusions have not been helping him as expected.  Due to increased concern she called EMS to bring him in.  Triage note states blood glucose of 417.     Clinical Impressions Patient received for OT evaluation. See flowsheet below for details of function. Generally, patient requiring MIN A for bed mobility and MIN-MAX A for ADLs. Significant cognitive deficits 2/2 dementia; wife states he is at cognitive baseline today. Did not attempt standing today since unsure if he would remain safe with just +1 assist.  Patient will benefit from continued OT while in acute care.      If plan is discharge home, recommend the following:   A lot of help with walking and/or transfers;A lot of help with bathing/dressing/bathroom;Assistance with cooking/housework;Assistance with feeding;Direct supervision/assist for medications management;Direct supervision/assist for financial management;Assist for transportation;Help with stairs or ramp for entrance;Supervision due to cognitive status     Functional Status Assessment   Patient has had a recent decline in their functional status and demonstrates the ability to make significant improvements in function in a reasonable and predictable amount of time.      Equipment Recommendations   Other (comment) (defer to next level of care)     Recommendations for Other Services         Precautions/Restrictions   Precautions Precautions: Fall Recall of Precautions/Restrictions: Impaired Restrictions Weight Bearing Restrictions Per Provider Order: No     Mobility Bed Mobility Overal bed mobility: Needs Assistance Bed Mobility: Supine to Sit, Sit to Supine     Supine to sit: Min assist Sit to supine: Min assist   General bed mobility comments: Good sitting balance at EOB.    Transfers                   General transfer comment: Did not t/f to standing 2/2 safety concerns and no +2 available.      Balance Overall balance assessment: Needs assistance Sitting-balance support: Feet supported Sitting balance-Leahy Scale: Good Sitting balance - Comments: seated EOB for approx 10 minutes, attempting to eat.                                   ADL either performed or assessed with clinical judgement   ADL Overall ADL's : Needs assistance/impaired Eating/Feeding: Moderate assistance Eating/Feeding Details (indicate cue type and reason): OT provided pt with fork with baked carrot on it; he was able to put it in his mouth, but then used fork to play with food despite visual and tactile cues to try to get pt to use fork appropriately; did the same with straw in iced tea. Pt chewed carrot for several minutes, then took out a large piece and put it on his plate.  Significant cognitive barriers.                           Toileting - Clothing Manipulation Details (indicate cue type and reason): dependent; pt had incontinent bowel movement in the bed; assisted with total assist at bed level to clean pt and replace pad; pt did well with rolling once cued to reach for the rails.       General ADL Comments: Pt with signficant cognitive impairment that is limiting function at this time. Confused about the task at  hand and difficulty with communication.     Vision Patient Visual Report: No change from baseline       Perception         Praxis         Pertinent Vitals/Pain Pain Assessment Pain Assessment: PAINAD Breathing: normal Negative Vocalization: none Facial Expression: smiling or inexpressive Body Language: relaxed Consolability: no need to console PAINAD Score: 0     Extremity/Trunk Assessment Upper Extremity Assessment Upper Extremity Assessment: Overall WFL for tasks assessed   Lower Extremity Assessment Lower Extremity Assessment: Defer to PT evaluation   Cervical / Trunk Assessment Cervical / Trunk Assessment: Normal   Communication Communication Communication: Impaired Factors Affecting Communication: Difficulty expressing self;Hearing impaired   Cognition Arousal: Alert Behavior During Therapy: WFL for tasks assessed/performed Cognition: History of cognitive impairments             OT - Cognition Comments: Wife states that pt usually eats well with a fork; today he was seeming to play with food using fork and straw. Pt unable to follow verbal or visual cues during session. Fluctuating between being antsy and being fatigued. Pt not able to provide his name when asked. Pt trying to remove gown frequently during session.                 Following commands: Impaired Following commands impaired: Follows one step commands inconsistently     Cueing  General Comments   Cueing Techniques: Verbal cues;Gestural cues;Tactile cues;Visual cues  Wife entered room halfway through session; wife providing all history information.   Exercises     Shoulder Instructions      Home Living Family/patient expects to be discharged to:: Private residence Living Arrangements: Spouse/significant other Available Help at Discharge: Family Type of Home: House Home Access: Stairs to enter Secretary/administrator of Steps: 2 Entrance Stairs-Rails: Can reach  both;Left;Right Home Layout: One level     Bathroom Shower/Tub: Chief Strategy Officer: Handicapped height     Home Equipment: Rollator (4 wheels);Cane - single point          Prior Functioning/Environment Prior Level of Function : Needs assist  Cognitive Assist : ADLs (cognitive)   ADLs (Cognitive): Step by step cues Physical Assist : Mobility (physical);ADLs (physical) Mobility (physical):  (wife reports she has to be there; he is a fall risk) ADLs (physical): IADLs;Toileting;Dressing;Bathing;Grooming Mobility Comments: Pt does not use AD. Wife states that he knows how to use rollator, but doesn't like to use it. Wife states he has had one fall in the past 6 months. ADLs Comments: Wife states she has to be there with him during showering for fall prevention, dressing, grooming, toileting. Wife states pt sleeps a lot during the days now. She had noticed a decline in the past few weeks. Wife does all IADLs. Wife states she will only leave him alone while sleeping and only for a maximum of 30  minutes. Started losing weight 6 weeks ago.    OT Problem List: Impaired balance (sitting and/or standing);Decreased coordination;Decreased cognition;Decreased safety awareness;Decreased knowledge of use of DME or AE;Decreased knowledge of precautions   OT Treatment/Interventions: Self-care/ADL training;Therapeutic activities;Patient/family education      OT Goals(Current goals can be found in the care plan section)   Acute Rehab OT Goals Patient Stated Goal: Go home OT Goal Formulation: With family Time For Goal Achievement: 07/19/24 Potential to Achieve Goals: Fair ADL Goals Pt Will Perform Grooming: with min assist;sitting Pt Will Perform Lower Body Dressing: with min assist;sit to/from stand Pt Will Transfer to Toilet: with min assist;bedside commode   OT Frequency:  Min 2X/week    Co-evaluation              AM-PAC OT 6 Clicks Daily Activity     Outcome  Measure Help from another person eating meals?: A Little Help from another person taking care of personal grooming?: A Lot Help from another person toileting, which includes using toliet, bedpan, or urinal?: Total Help from another person bathing (including washing, rinsing, drying)?: A Lot Help from another person to put on and taking off regular upper body clothing?: Total Help from another person to put on and taking off regular lower body clothing?: Total 6 Click Score: 10   End of Session Nurse Communication: Mobility status  Activity Tolerance: Patient tolerated treatment well Patient left: in bed;with bed alarm set;with call bell/phone within reach;with nursing/sitter in room;with family/visitor present (NA and RN in room)  OT Visit Diagnosis: Unsteadiness on feet (R26.81);History of falling (Z91.81);Other symptoms and signs involving cognitive function                Time: 1416-1455 OT Time Calculation (min): 39 min Charges:  OT General Charges $OT Visit: 1 Visit OT Evaluation $OT Eval Moderate Complexity: 1 Mod OT Treatments $Self Care/Home Management : 8-22 mins $Therapeutic Activity: 8-22 mins  Jasmine Arlean Shams, MS, OTR/L   Jasmine Shams 07/05/2024, 4:16 PM

## 2024-07-06 ENCOUNTER — Inpatient Hospital Stay

## 2024-07-06 DIAGNOSIS — R531 Weakness: Secondary | ICD-10-CM | POA: Diagnosis not present

## 2024-07-06 LAB — BASIC METABOLIC PANEL WITH GFR
Anion gap: 11 (ref 5–15)
BUN: 39 mg/dL — ABNORMAL HIGH (ref 8–23)
CO2: 22 mmol/L (ref 22–32)
Calcium: 8.8 mg/dL — ABNORMAL LOW (ref 8.9–10.3)
Chloride: 108 mmol/L (ref 98–111)
Creatinine, Ser: 1.1 mg/dL (ref 0.61–1.24)
GFR, Estimated: >60 mL/min (ref >60)
Glucose, Bld: 60 mg/dL — ABNORMAL LOW (ref 70–99)
Potassium: 3.6 mmol/L (ref 3.5–5.1)
Sodium: 141 mmol/L (ref 135–145)

## 2024-07-06 LAB — GLUCOSE, CAPILLARY
Glucose-Capillary: 161 mg/dL — ABNORMAL HIGH (ref 70–99)
Glucose-Capillary: 82 mg/dL (ref 70–99)
Glucose-Capillary: 250 mg/dL — ABNORMAL HIGH (ref 70–99)
Glucose-Capillary: 179 mg/dL — ABNORMAL HIGH (ref 70–99)
Glucose-Capillary: 166 mg/dL — ABNORMAL HIGH (ref 70–99)
Glucose-Capillary: 277 mg/dL — ABNORMAL HIGH (ref 70–99)

## 2024-07-06 LAB — CBC WITH DIFFERENTIAL/PLATELET
Abs Immature Granulocytes: 0.04 K/uL (ref 0.00–0.07)
Basophils Absolute: 0 K/uL (ref 0.0–0.1)
Basophils Relative: 0 %
Eosinophils Absolute: 0.1 K/uL (ref 0.0–0.5)
Eosinophils Relative: 1 %
HCT: 32.6 % — ABNORMAL LOW (ref 39.0–52.0)
Hemoglobin: 10.5 g/dL — ABNORMAL LOW (ref 13.0–17.0)
Immature Granulocytes: 0 %
Lymphocytes Relative: 22 %
Lymphs Abs: 2.3 K/uL (ref 0.7–4.0)
MCH: 28.9 pg (ref 26.0–34.0)
MCHC: 32.2 g/dL (ref 30.0–36.0)
MCV: 89.8 fL (ref 80.0–100.0)
Monocytes Absolute: 0.8 K/uL (ref 0.1–1.0)
Monocytes Relative: 8 %
Neutro Abs: 7.3 K/uL (ref 1.7–7.7)
Neutrophils Relative %: 69 %
Platelets: 198 K/uL (ref 150–400)
RBC: 3.63 MIL/uL — ABNORMAL LOW (ref 4.22–5.81)
RDW: 19.6 % — ABNORMAL HIGH (ref 11.5–15.5)
WBC: 10.6 K/uL — ABNORMAL HIGH (ref 4.0–10.5)
nRBC: 0 % (ref 0.0–0.2)

## 2024-07-06 LAB — URINE CULTURE

## 2024-07-06 NOTE — Progress Notes (Signed)
 Bilateral elbow skin tears cleaned and foam dressing changed. Patient spouse states that he gets skin tears frequently. New foam dressing applied to sacrum. Red but blanchable. No active pressure injuries at this time.

## 2024-07-06 NOTE — Evaluation (Signed)
 Physical Therapy Evaluation Patient Details Name: Luke Chen MRN: [id number] DOB: 23-Jul-1941 Today's Date: 07/06/2024  History of Present Illness  Luke Chen is an 83yoM who comes to [facility] on 11/21 via [facility], wife called EMS after pt felt weak and lie down on the floor. BG in 400s. At baseline pt is bed bound with dementia. 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  Clinical Impression  On entry, pt in bed, wife at bedside, sitting also at bedside. Pt is fidgeting with his breakfast tray in an a manner that is inappropriate to meal/feed tasks. Pt has a lot of confusion and disorientation this date, questionably worse than baseline deficits which are considerable. Pt comes to EOB with heavy tactile facilitation, at EOB balances well. Pt able to come to standing, then take some steps to counter where he is assisted with hand hygiene tasks- pt very disoriented to activity and items, ultimately spends more time trying to clean the sink. Pt has a few posterior LOB, showing signs of delayed awareness and delayed attempted to right self with hands. Pt begins to have BM on floor, no awareness of this, staff brings [facility] to patient, then pt asked to sit while staff assists. Some interaction with wife during session indicated no memory of OT assessment from previous day, unclear if wife also has some cognitive impairment. Pt has been grossly noncompliant with devices at home, unclear that he would be safe to do any degree of AMB at home given his cognitive deficits and wife's physical limitations. Will continue to follow.       If plan is discharge home, recommend the following: A little help with walking and/or transfers;A little help with bathing/dressing/bathroom;Help with stairs or ramp for entrance;Assist for transportation;Assistance with cooking/housework;Direct supervision/assist for medications management;Direct supervision/assist for financial management;Supervision due  to cognitive status   Can travel by private vehicle   Yes    Equipment Recommendations None recommended by PT  Recommendations for Other Services       Functional Status Assessment Patient has had a recent decline in their functional status and demonstrates the ability to make significant improvements in function in a reasonable and predictable amount of time.     Precautions / Restrictions Precautions Precautions: Fall Recall of Precautions/Restrictions: Impaired Restrictions Weight Bearing Restrictions Per Provider Order: No      Mobility  Bed Mobility Overal bed mobility: Needs Assistance Bed Mobility: Supine to Sit     Supine to sit: Min assist (mostly for tactile cues and initiation)     General bed mobility comments: Good sitting balance at EOB.    Transfers Overall transfer level: Needs assistance Equipment used: None Transfers: Sit to/from Stand Sit to Stand: Contact guard assist           General transfer comment: able to reach for counter and come to standing without assist, does nto lose balance, but demonstrate significant unsteadiness;    Ambulation/Gait             Pre-gait activities: stood at sink for ~2 minutes, assisting pt with hand hygiene; requires very invovled gestural, handoverhand cues to perform tasks. Pt generally confused, requires lots of redirection. Ultimately has BM in floor unaware.    Stairs            Wheelchair Mobility     Tilt Bed    Modified Rankin (Stroke Patients Only)       Balance  Standardized Balance Assessment Standardized Balance Assessment :  (several posterior LOB while standing at counter, some awareness, but delayed righting reactions.)           Pertinent Vitals/Pain Pain Assessment Pain Assessment: No/denies pain    Home Living Family/patient expects to be discharged to:: Private residence Living Arrangements: Spouse/significant  other Available Help at Discharge: Family Type of Home: House Home Access: Stairs to enter Entrance Stairs-Rails: Can reach both;Left;Right Entrance Stairs-Number of Steps: 2   Home Layout: One level Home Equipment: Rollator (4 wheels);Cane - single point      Prior Function Prior Level of Function : Needs assist             Mobility Comments: Pt does not use AD. Wife states that he knows how to use rollator, but doesn't like to use it. Wife states he has had one fall in the past 6 months. ADLs Comments: Wife states she has to be there with him during showering for fall prevention, dressing, grooming, toileting. Wife states pt sleeps a lot during the days now. She had noticed a decline in the past few weeks. Wife does all IADLs. Wife states she will only leave him alone while sleeping and only for a maximum of 30 minutes. Started losing weight 6 weeks ago.     Extremity/Trunk Assessment                Communication        Cognition Arousal: Alert Behavior During Therapy: WFL for tasks assessed/performed   PT - Cognitive impairments: History of cognitive impairments                                 Cueing       General Comments      Exercises     Assessment/Plan    PT Assessment Patient needs continued PT services  PT Problem List Decreased strength;Decreased range of motion;Decreased activity tolerance;Decreased balance;Decreased mobility;Decreased cognition;Decreased safety awareness;Decreased knowledge of precautions       PT Treatment Interventions DME instruction;Gait training;Stair training;Functional mobility training;Therapeutic activities;Therapeutic exercise;Balance training;Patient/family education;Neuromuscular re-education    PT Goals (Current goals can be found in the Care Plan section)  Acute Rehab PT Goals PT Goal Formulation: Patient unable to participate in goal setting    Frequency Min 1X/week     Co-evaluation                AM-PAC PT 6 Clicks Mobility  Outcome Measure Help needed turning from your back to your side while in a flat bed without using bedrails?: A Lot Help needed moving from lying on your back to sitting on the side of a flat bed without using bedrails?: A Lot Help needed moving to and from a bed to a chair (including a wheelchair)?: A Little Help needed standing up from a chair using your arms (e.g., wheelchair or bedside chair)?: A Little Help needed to walk in hospital room?: A Lot Help needed climbing 3-5 steps with a railing? : A Lot 6 Click Score: 14    End of Session Equipment Utilized During Treatment: Oxygen  Activity Tolerance: Patient tolerated treatment well;No increased pain Patient left: in chair;with family/visitor present;with nursing/sitter in room;with call bell/phone within reach Nurse Communication: Mobility status PT Visit Diagnosis: Other abnormalities of gait and mobility (R26.89);Muscle weakness (generalized) (M62.81)    Time: 9052-8993 PT Time Calculation (min) (ACUTE ONLY): 19 min   Charges:  PT Evaluation $PT Eval Moderate Complexity: 1 Mod PT Treatments $Therapeutic Activity: 8-22 mins PT General Charges $$ ACUTE PT VISIT: 1 Visit    12:54 PM, 07/06/24 Peggye JAYSON Linear, PT, DPT Physical Therapist - [facility]  [phone] (ASCOM)     Ayden Apodaca C 07/06/2024, 12:26 PM

## 2024-07-06 NOTE — Progress Notes (Signed)
 Left ankle / foot swelling noted. MD made aware.

## 2024-07-06 NOTE — Progress Notes (Signed)
 Patient up to bedside commode

## 2024-07-06 NOTE — Progress Notes (Signed)
 Patient has blanchable redness to buttocks. Foam dressing in place.

## 2024-07-06 NOTE — Progress Notes (Signed)
 Progress Note   Patient: Luke Chen FMW:[id number] DOB: 30-Jan-1941 DOA: 07/04/2024     1 DOS: the patient was seen and examined on 07/06/2024     Brief hospital course:   From HPI Luke Chen is a 83 y.o. 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  He was brought in by EMS from home due to concerns for weakness.  Wife at the bedside who gives a history, states that he had been declining for some time, sleeping for most of the day.  States the iron infusions have not been helping him as expected.  Due to increased concern she called EMS to bring him in.  Triage note states blood glucose of 417. In the ED, very frail-appearing male.  Initial vitals unremarkable except for somewhat soft blood pressure with systolic 95-1 02.8 1 Labs notable for WBC 12.1, hemoglobin 10.7, down from 11.2 on 10/8 Blood glucose 294 with anion gap 19 and bicarb 18.  VBG unremarkable and beta hydroxybutyric acid 0.39 Creatinine 1.36 up from baseline of 0.9 on 10/7 Troponin 143--128 VBG unremarkable CK1 48 UA pending. EKG showing sinus at 92 with no acute ST-T wave changes Chest x-ray nonacute, CT head nonacute   Patient was started on an insulin  drip due to concern for DKA along with fluids and electrolyte repletion per DKA   Admission requested    Assessment and Plan: Generalized weakness Possible urinary tract infection Dehydration/hypotension Suspect multifactorial, including dehydration, possible underlying infection, hyperglycemia, with background of anemia and frailty  Troponin elevated but downtrending so low suspicion for ACS Urinalysis shows possible UTI Patient initiated on ceftriaxone  Fall and aspiration  precautions PT OT consulted     Hyperglycemia Patient has been weaned off insulin  drip Continue as needed insulin    Elevated troponin CAD s/p stent to LCx and LAD Suspect demand ischemia.  Troponin 143-> 128   AKI (acute kidney injury) Creatinine 1.36 up from baseline of 0.9 on 10/7 Cautious IV hydration, monitoring for fluid overload in view of history of HFrEF Monitor renal function and avoid nephrotoxins   Chronic HFrEF (heart failure with reduced ejection fraction) (HCC) Clinically dry at this time Holding GDMT meds tonight due to soft blood pressure Monitor for fluid overload with IV fluids to treat dehydration   Chronic anticoagulation History of LV thrombus Continue apixaban    IDA (iron deficiency anemia) On iron transfusion, last transfusion 11/17 Hemoglobin fairly stable   Dementia (HCC) Frailty Delirium precautions Continue Aricept  Fall and aspiration precautions One-to-one precaution ordered   Essential hypertension Holding antihypertensives due to soft blood pressure       DVT prophylaxis: apixaban    Consults: none   Advance Care Planning:  DNR/DNI   Family Communication: wife at bedside   Disposition Plan: Back to previous home environment   Physical Exam:   Vitals and nursing note reviewed.  Constitutional: Laying in bed in no acute distress HENT:     Head: Normocephalic and atraumatic.  Cardiovascular:     Rate and Rhythm: Normal rate and regular rhythm.     Heart sounds: Normal heart sounds.  Pulmonary:     Effort: Pulmonary effort is normal.     Breath sounds: Normal breath sounds.  Abdominal:     Palpations: Abdomen is soft.  Tenderness: There is no abdominal tenderness.  Neurological: Sedated and symptoms of dementia    General: No focal deficit present.   Subjective: No acute overnight complaints Ankle pain noted for which an x-ray was obtained which did not show any acute pathology  Data Reviewed:  Vitals:   07/06/24  0326 07/06/24 0400 07/06/24 0800 07/06/24 1200  BP:   120/80 102/78  Pulse:   81 100  Resp:  15 16 17   Temp:   97.6 F (36.4 C) 97.8 F (36.6 C)  TempSrc:   Axillary Oral  SpO2:   95% 98%  Weight: 63.2 kg     Height:          Latest Ref Rng & Units 07/06/2024    6:06 AM 07/05/2024   11:36 AM 07/05/2024    3:50 AM  BMP  Glucose 70 - 99 mg/dL 60  797  894   BUN 8 - 23 mg/dL 39  39  37   Creatinine 0.61 - 1.24 mg/dL 8.89  8.78  8.78   Sodium 135 - 145 mmol/L 141  139  140   Potassium 3.5 - 5.1 mmol/L 3.6  4.3  4.2   Chloride 98 - 111 mmol/L 108  106  106   CO2 22 - 32 mmol/L 22  18  16    Calcium  8.9 - 10.3 mg/dL 8.8  9.2  8.6      Author: Drue ONEIDA Potter, MD 07/06/2024 4:49 PM  For on call review [email or web address].

## 2024-07-07 DIAGNOSIS — R531 Weakness: Secondary | ICD-10-CM | POA: Diagnosis not present

## 2024-07-07 LAB — CBC WITH DIFFERENTIAL/PLATELET
Abs Immature Granulocytes: 0.03 K/uL (ref 0.00–0.07)
Basophils Absolute: 0 K/uL (ref 0.0–0.1)
Basophils Relative: 0 %
Eosinophils Absolute: 0.3 K/uL (ref 0.0–0.5)
Eosinophils Relative: 3 %
HCT: 31.1 % — ABNORMAL LOW (ref 39.0–52.0)
Hemoglobin: 10.3 g/dL — ABNORMAL LOW (ref 13.0–17.0)
Immature Granulocytes: 0 %
Lymphocytes Relative: 25 %
Lymphs Abs: 2.2 K/uL (ref 0.7–4.0)
MCH: 29.4 pg (ref 26.0–34.0)
MCHC: 33.1 g/dL (ref 30.0–36.0)
MCV: 88.9 fL (ref 80.0–100.0)
Monocytes Absolute: 0.7 K/uL (ref 0.1–1.0)
Monocytes Relative: 8 %
Neutro Abs: 5.4 K/uL (ref 1.7–7.7)
Neutrophils Relative %: 64 %
Platelets: 194 K/uL (ref 150–400)
RBC: 3.5 MIL/uL — ABNORMAL LOW (ref 4.22–5.81)
RDW: 20.1 % — ABNORMAL HIGH (ref 11.5–15.5)
WBC: 8.7 K/uL (ref 4.0–10.5)
nRBC: 0 % (ref 0.0–0.2)

## 2024-07-07 LAB — BASIC METABOLIC PANEL WITH GFR
Anion gap: 11 (ref 5–15)
BUN: 37 mg/dL — ABNORMAL HIGH (ref 8–23)
CO2: 22 mmol/L (ref 22–32)
Calcium: 9 mg/dL (ref 8.9–10.3)
Chloride: 109 mmol/L (ref 98–111)
Creatinine, Ser: 1.09 mg/dL (ref 0.61–1.24)
GFR, Estimated: >60 mL/min (ref >60)
Glucose, Bld: 133 mg/dL — ABNORMAL HIGH (ref 70–99)
Potassium: 3.9 mmol/L (ref 3.5–5.1)
Sodium: 141 mmol/L (ref 135–145)

## 2024-07-07 LAB — GLUCOSE, CAPILLARY
Glucose-Capillary: 165 mg/dL — ABNORMAL HIGH (ref 70–99)
Glucose-Capillary: 193 mg/dL — ABNORMAL HIGH (ref 70–99)
Glucose-Capillary: 248 mg/dL — ABNORMAL HIGH (ref 70–99)
Glucose-Capillary: 358 mg/dL — ABNORMAL HIGH (ref 70–99)
Glucose-Capillary: 92 mg/dL (ref 70–99)

## 2024-07-07 NOTE — TOC CM/SW Note (Signed)
 Transition of Care [facility]) - Inpatient Brief Assessment   Patient Details  Name: Luke Chen MRN: [id number] Date of Birth: 1941/05/10  Transition of Care [facility]) CM/SW Contact:    Lauraine JAYSON Carpen, LCSW Phone Number: 07/07/2024, 1:09 PM   Clinical Narrative: [facility] PT evaluation recommends LTC. Per RN, wife does not want LTC and wants patient to go home at discharge. MD consulted palliative. Will follow up once they have met with patient and wife and made recommendations.  Transition of Care Asessment: Insurance and Status: Insurance coverage has been reviewed Patient has primary care physician: Yes Home environment has been reviewed: Single family home Prior level of function:: Needs assist Prior/Current Home Services: No current home services Social Drivers of Health Review: SDOH reviewed no interventions necessary Readmission risk has been reviewed: Yes Transition of care needs: transition of care needs identified, TOC will continue to follow

## 2024-07-07 NOTE — Plan of Care (Signed)

## 2024-07-07 NOTE — Care Management Important Message (Signed)
 Important Message  Patient Details  Name: Luke Chen MRN: [id number] Date of Birth: 20-May-1941   Important Message Given:  Yes - Medicare IM     [name] 07/07/2024, 4:30 PM

## 2024-07-07 NOTE — Progress Notes (Signed)
 Occupational Therapy Treatment Patient Details Name: Luke Chen MRN: [id number] DOB: [birth year] Today's Date: 07/07/2024   History of present illness Luke Chen is an 83yoM who comes to [facility] on 11/21 via [facility], wife called EMS after pt felt weak and lay down on the floor. BG in 400s. 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   OT comments  Pt received in bed with spouse and sitter present, pt with SpO2 86% on RA as pt removed O2 via [place]. Pt lethargic (spouse reporting potentially due to lack of sleep, pt was awake to feed himself breakfast earlier). Pt does not participate or respond, resistive and agitated with attempts to don [place]. Bilateral mitts re-applied and 2L O2 placed with SpO2 increasing to 96%. MOD - MAX A +2 for rolling + repositioning. OT will continue to attempt to progress pt towards functional gains next session, discharge recommendation appropriate. Patient will benefit from continued inpatient follow up therapy, <3 hours/day as pt was able to ambulate prior to admission.       If plan is discharge home, recommend the following:  Two people to help with walking and/or transfers;Two people to help with bathing/dressing/bathroom;Assistance with feeding;Assistance with cooking/housework;Direct supervision/assist for medications management;Direct supervision/assist for financial management;Assist for transportation;Help with stairs or ramp for entrance;Supervision due to cognitive status   Equipment Recommendations  Other (comment)       Precautions / Restrictions Precautions Precautions: Fall Recall of Precautions/Restrictions: Impaired Restrictions Weight Bearing Restrictions Per Provider Order: No       Mobility Bed Mobility Overal bed mobility: Needs Assistance Bed Mobility: Rolling Rolling: Max assist, +2 for physical assistance              Transfers Overall transfer level: Needs assistance                 General  transfer comment: NT     Balance Overall balance assessment: Needs assistance     Sitting balance - Comments: unsafe to attempt 2/2 lethargy & inability to follow commands                                   ADL either performed or assessed with clinical judgement   ADL Overall ADL's : Needs assistance/impaired   Eating/Feeding Details (indicate cue type and reason): too lethargic to attempt eating, pt's spouse did state he fed himself breakfast                                         Communication Communication Communication: Impaired Factors Affecting Communication: Difficulty expressing self;Hearing impaired   Cognition Arousal: Lethargic Behavior During Therapy: Flat affect, Restless, Agitated Cognition: History of cognitive impairments             OT - Cognition Comments: pt not alert, not able to follow commands. agitated when trying to don [place].                 Following commands: Impaired Following commands impaired: Follows one step commands inconsistently      Cueing   Cueing Techniques: Verbal cues, Gestural cues, Tactile cues, Visual cues        General Comments Pt restless, found with [place] doffed and SpO2 86% on RA. Sitter in room stating pt keeps removing O2.  Pertinent Vitals/ Pain       Pain Assessment Pain Assessment: No/denies pain Breathing: normal Negative Vocalization: none Facial Expression: smiling or inexpressive Body Language: relaxed Consolability: no need to console PAINAD Score: 0   Frequency  Min 2X/week        Progress Toward Goals  OT Goals(current goals can now be found in the care plan section)  Progress towards OT goals: Not progressing toward goals - comment (not progressing due to lethargy & cognition)  Acute Rehab OT Goals OT Goal Formulation: With family Time For Goal Achievement: 07/19/24 Potential to Achieve Goals: Fair ADL Goals Pt Will Perform Grooming: with min  assist;sitting Pt Will Perform Lower Body Dressing: with min assist;sit to/from stand Pt Will Transfer to Toilet: with min assist;bedside commode  Plan      Co-evaluation    PT/OT/SLP Co-Evaluation/Treatment: Yes Reason for Co-Treatment: Complexity of the patient's impairments (multi-system involvement);For patient/therapist safety;To address functional/ADL transfers PT goals addressed during session: Mobility/safety with mobility OT goals addressed during session: ADL's and self-care      AM-PAC OT 6 Clicks Daily Activity     Outcome Measure   Help from another person eating meals?: Total Help from another person taking care of personal grooming?: Total Help from another person toileting, which includes using toliet, bedpan, or urinal?: Total Help from another person bathing (including washing, rinsing, drying)?: Total Help from another person to put on and taking off regular upper body clothing?: Total Help from another person to put on and taking off regular lower body clothing?: Total 6 Click Score: 6    End of Session Equipment Utilized During Treatment: Oxygen   OT Visit Diagnosis: Unsteadiness on feet (R26.81);History of falling (Z91.81);Other symptoms and signs involving cognitive function   Activity Tolerance Patient limited by lethargy   Patient Left in bed;with call bell/phone within reach;with bed alarm set;with nursing/sitter in room;with family/visitor present   Nurse Communication Mobility status (pt doffed O2)        Time: 8798-8772 OT Time Calculation (min): 26 min  Charges: OT General Charges $OT Visit: 1 Visit OT Treatments $Self Care/Home Management : 8-22 mins Sherwin Hollingshed L. Paris Hohn, OTR/L  07/07/24, 4:19 PM

## 2024-07-07 NOTE — Progress Notes (Signed)
 Physical Therapy Treatment Patient Details Name: Luke Chen MRN: [id number] DOB: 08/18/1940 Today's Date: 07/07/2024   History of Present Illness Luke Chen is an 83yoM who comes to [facility] on 11/21 via [facility], wife called EMS after pt felt weak and lie down on the floor. BG in 400s. At baseline pt is bed bound with dementia. 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    PT Comments  Pt received in bed for co-tx with OT due to recent change in mental status. Pt is very lethargic possibly due to lack of sleep over weekend. Pt not truly responding during session with eyes closed and resisting minimal movement. SpO2 86% on RA due to removing O2. Mits applied and 2L O2 placed on with SpO2 increasing to 96%. Mod/MaxA of 2 for rolling and repositioning due to pt resisting. Unable to safely mobilize this date due to lethargy, pt however was much more awake last session. Will continue to progress as tolerated. Pt was ambulatory prior to admission.    If plan is discharge home, recommend the following: A lot of help with walking and/or transfers;A lot of help with bathing/dressing/bathroom;Assistance with cooking/housework;Direct supervision/assist for medications management;Direct supervision/assist for financial management;Assist for transportation;Help with stairs or ramp for entrance;Supervision due to cognitive status   Can travel by private vehicle     No  Equipment Recommendations  None recommended by PT    Recommendations for Other Services       Precautions / Restrictions Precautions Precautions: Fall Recall of Precautions/Restrictions: Impaired Restrictions Weight Bearing Restrictions Per Provider Order: No     Mobility  Bed Mobility Overal bed mobility: Needs Assistance Bed Mobility: Rolling Rolling: Max assist, +2 for physical assistance         General bed mobility comments:  (Pt resistant to any bed mobility or rolling requiring increased  assist to roll and reposition.)    Transfers                   General transfer comment:  (Unable to transfer to EOB due to impaired alertness)    Ambulation/Gait               General Gait Details:  (Unable due to lethagy)   Stairs             Wheelchair Mobility     Tilt Bed    Modified Rankin (Stroke Patients Only)       Balance                                            Communication Communication Communication: Impaired Factors Affecting Communication: Difficulty expressing self;Hearing impaired  Cognition Arousal: Lethargic Behavior During Therapy: Flat affect   PT - Cognitive impairments: History of cognitive impairments                       PT - Cognition Comments: Very lethargic difficulty to arouse Following commands: Impaired Following commands impaired: Follows one step commands inconsistently    Cueing Cueing Techniques: Verbal cues, Gestural cues, Tactile cues, Visual cues  Exercises      General Comments General comments (skin integrity, edema, etc.): Pt extremely lethargic this date. Unable to advance to sitting EOB      Pertinent Vitals/Pain Pain Assessment Pain Assessment: No/denies pain    Home Living  Prior Function            PT Goals (current goals can now be found in the care plan section)      Frequency    Min 1X/week      PT Plan      Co-evaluation PT/OT/SLP Co-Evaluation/Treatment: Yes   PT goals addressed during session: Mobility/safety with mobility        AM-PAC PT 6 Clicks Mobility   Outcome Measure  Help needed turning from your back to your side while in a flat bed without using bedrails?: A Lot Help needed moving from lying on your back to sitting on the side of a flat bed without using bedrails?: A Lot Help needed moving to and from a bed to a chair (including a wheelchair)?: A Little Help needed standing up from  a chair using your arms (e.g., wheelchair or bedside chair)?: A Little Help needed to walk in hospital room?: A Lot Help needed climbing 3-5 steps with a railing? : A Lot 6 Click Score: 14    End of Session Equipment Utilized During Treatment: Oxygen  Activity Tolerance: Patient limited by lethargy Patient left: in bed;with call bell/phone within reach;with bed alarm set;with nursing/sitter in room;with family/visitor present Nurse Communication: Mobility status PT Visit Diagnosis: Other abnormalities of gait and mobility (R26.89);Muscle weakness (generalized) (M62.81)     Time: 8799-8774 PT Time Calculation (min) (ACUTE ONLY): 25 min  Charges:    $Therapeutic Activity: 8-22 mins PT General Charges $$ ACUTE PT VISIT: 1 Visit                    Darice Bohr, PTA  Darice JAYSON Bohr 07/07/2024, 1:58 PM

## 2024-07-07 NOTE — Progress Notes (Signed)
 Progress Note   Patient: Luke Chen FMW:[id number] DOB: 1941/05/02 DOA: 07/04/2024     2 DOS: the patient was seen and examined on 07/07/2024     Brief hospital course:   From HPI Luke Chen is a 83 y.o. 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  He was brought in by EMS from home due to concerns for weakness.  Wife at the bedside who gives a history, states that he had been declining for some time, sleeping for most of the day.  States the iron infusions have not been helping him as expected.  Due to increased concern she called EMS to bring him in.  Triage note states blood glucose of 417. In the ED, very frail-appearing male.  Initial vitals unremarkable except for somewhat soft blood pressure with systolic 95-1 02.8 1 Labs notable for WBC 12.1, hemoglobin 10.7, down from 11.2 on 10/8 Blood glucose 294 with anion gap 19 and bicarb 18.  VBG unremarkable and beta hydroxybutyric acid 0.39 Creatinine 1.36 up from baseline of 0.9 on 10/7 Troponin 143--128 VBG unremarkable CK1 48 UA pending. EKG showing sinus at 92 with no acute ST-T wave changes Chest x-ray nonacute, CT head nonacute   Patient was started on an insulin  drip due to concern for DKA along with fluids and electrolyte repletion per DKA   Admission requested    Assessment and Plan: Generalized weakness Possible urinary tract infection Dehydration/hypotension Suspect multifactorial, including dehydration, possible underlying infection, hyperglycemia, with background of anemia and frailty  Troponin elevated but downtrending so low suspicion for ACS Urinalysis shows possible UTI Continue ceftriaxone  Fall and aspiration precautions PT OT  consulted     Hyperglycemia Patient has been weaned off insulin  drip Continue as needed insulin    Elevated troponin CAD s/p stent to LCx and LAD Suspect demand ischemia.  Troponin 143-> 128   AKI (acute kidney injury) Creatinine 1.36 up from baseline of 0.9 on 10/7 Cautious IV hydration, monitoring for fluid overload in view of history of HFrEF Monitor renal function and avoid nephrotoxins   Chronic HFrEF (heart failure with reduced ejection fraction) (HCC) Clinically dry at this time Holding GDMT meds tonight due to soft blood pressure Monitor for fluid overload with IV fluids to treat dehydration   Chronic anticoagulation History of LV thrombus Continue apixaban    IDA (iron deficiency anemia) On iron transfusion, last transfusion 11/17 Hemoglobin fairly stable   Dementia (HCC) Frailty Delirium precautions Continue Aricept  Fall and aspiration precautions One-to-one precaution ordered   Essential hypertension Holding antihypertensives due to soft blood pressure       DVT prophylaxis: apixaban    Consults: none   Advance Care Planning:  DNR/DNI   Family Communication: wife at bedside   Disposition Plan: Back to previous home environment   Physical Exam:   Vitals and nursing note reviewed.  Constitutional: Laying in bed in no acute distress HENT:     Head: Normocephalic and atraumatic.  Cardiovascular:     Rate and Rhythm: Normal rate and regular rhythm.     Heart sounds: Normal heart sounds.  Pulmonary:     Effort: Pulmonary effort is normal.     Breath sounds: Normal breath sounds.  Abdominal:     Palpations: Abdomen is soft.  Tenderness: There is no abdominal tenderness.  Neurological: Sedated and symptoms of dementia    General: No focal deficit present.    Subjective: TOC working on long-term care placement Patient's wife wishes to take patient home at discharge Palliative care consulted for goals of care discussion   Data Reviewed:     Vitals:   07/07/24 0500 07/07/24 0835 07/07/24 1300 07/07/24 1601  BP: 109/70 111/66 93/60 97/66   Pulse: 78 89 (!) 55   Resp: 17 18    Temp: 98.4 F (36.9 C) (!) 97.5 F (36.4 C) (!) 97.2 F (36.2 C) 97.7 F (36.5 C)  TempSrc: Oral  Axillary Axillary  SpO2: 100% 91% 97% 97%  Weight:      Height:          Latest Ref Rng & Units 07/07/2024    5:23 AM 07/06/2024    8:28 AM 07/05/2024    6:04 AM  CBC  WBC 4.0 - 10.5 K/uL 8.7  10.6  10.5   Hemoglobin 13.0 - 17.0 g/dL 89.6  89.4  89.8   Hematocrit 39.0 - 52.0 % 31.1  32.6  31.3   Platelets 150 - 400 K/uL 194  198  210        Latest Ref Rng & Units 07/07/2024    5:23 AM 07/06/2024    6:06 AM 07/05/2024   11:36 AM  BMP  Glucose 70 - 99 mg/dL 866  60  797   BUN 8 - 23 mg/dL 37  39  39   Creatinine 0.61 - 1.24 mg/dL 8.90  8.89  8.78   Sodium 135 - 145 mmol/L 141  141  139   Potassium 3.5 - 5.1 mmol/L 3.9  3.6  4.3   Chloride 98 - 111 mmol/L 109  108  106   CO2 22 - 32 mmol/L 22  22  18    Calcium  8.9 - 10.3 mg/dL 9.0  8.8  9.2      Author: Drue ONEIDA Potter, MD 07/07/2024 5:30 PM  For on call review [email or web address].

## 2024-07-08 DIAGNOSIS — E1110 Type 2 diabetes mellitus with ketoacidosis without coma: Secondary | ICD-10-CM

## 2024-07-08 DIAGNOSIS — Z515 Encounter for palliative care: Secondary | ICD-10-CM | POA: Diagnosis not present

## 2024-07-08 DIAGNOSIS — R531 Weakness: Secondary | ICD-10-CM | POA: Diagnosis not present

## 2024-07-08 DIAGNOSIS — F01B0 Vascular dementia, moderate, without behavioral disturbance, psychotic disturbance, mood disturbance, and anxiety: Secondary | ICD-10-CM

## 2024-07-08 DIAGNOSIS — I5022 Chronic systolic (congestive) heart failure: Secondary | ICD-10-CM | POA: Diagnosis not present

## 2024-07-08 LAB — CBC WITH DIFFERENTIAL/PLATELET
Abs Immature Granulocytes: 0.05 K/uL (ref 0.00–0.07)
Basophils Absolute: 0 K/uL (ref 0.0–0.1)
Basophils Relative: 0 %
Eosinophils Absolute: 0.2 K/uL (ref 0.0–0.5)
Eosinophils Relative: 2 %
HCT: 32.9 % — ABNORMAL LOW (ref 39.0–52.0)
Hemoglobin: 10.6 g/dL — ABNORMAL LOW (ref 13.0–17.0)
Immature Granulocytes: 0 %
Lymphocytes Relative: 21 %
Lymphs Abs: 2.4 K/uL (ref 0.7–4.0)
MCH: 29.5 pg (ref 26.0–34.0)
MCHC: 32.2 g/dL (ref 30.0–36.0)
MCV: 91.6 fL (ref 80.0–100.0)
Monocytes Absolute: 0.7 K/uL (ref 0.1–1.0)
Monocytes Relative: 6 %
Neutro Abs: 7.9 K/uL — ABNORMAL HIGH (ref 1.7–7.7)
Neutrophils Relative %: 71 %
Platelets: 224 K/uL (ref 150–400)
RBC: 3.59 MIL/uL — ABNORMAL LOW (ref 4.22–5.81)
RDW: 20.8 % — ABNORMAL HIGH (ref 11.5–15.5)
WBC: 11.3 K/uL — ABNORMAL HIGH (ref 4.0–10.5)
nRBC: 0 % (ref 0.0–0.2)

## 2024-07-08 LAB — GLUCOSE, CAPILLARY
Glucose-Capillary: 125 mg/dL — ABNORMAL HIGH (ref 70–99)
Glucose-Capillary: 160 mg/dL — ABNORMAL HIGH (ref 70–99)
Glucose-Capillary: 165 mg/dL — ABNORMAL HIGH (ref 70–99)
Glucose-Capillary: 190 mg/dL — ABNORMAL HIGH (ref 70–99)
Glucose-Capillary: 255 mg/dL — ABNORMAL HIGH (ref 70–99)
Glucose-Capillary: 268 mg/dL — ABNORMAL HIGH (ref 70–99)
Glucose-Capillary: 312 mg/dL — ABNORMAL HIGH (ref 70–99)

## 2024-07-08 LAB — BASIC METABOLIC PANEL WITH GFR
Anion gap: 13 (ref 5–15)
BUN: 42 mg/dL — ABNORMAL HIGH (ref 8–23)
CO2: 18 mmol/L — ABNORMAL LOW (ref 22–32)
Calcium: 9 mg/dL (ref 8.9–10.3)
Chloride: 109 mmol/L (ref 98–111)
Creatinine, Ser: 1.15 mg/dL (ref 0.61–1.24)
GFR, Estimated: >60 mL/min (ref >60)
Glucose, Bld: 126 mg/dL — ABNORMAL HIGH (ref 70–99)
Potassium: 4.4 mmol/L (ref 3.5–5.1)
Sodium: 140 mmol/L (ref 135–145)

## 2024-07-08 NOTE — Progress Notes (Signed)
 Occupational Therapy Treatment Patient Details Name: Luke Chen MRN: [id number] DOB: [birth year] Today's Date: 07/08/2024   History of present illness Luke Chen is an 83yoM who comes to [facility] on 11/21 via [facility], wife called EMS after pt felt weak and lie down on the floor. BG in 400s. 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   OT comments  Pt seen for OT treatment on this date. Upon arrival to room pt seated on EOB with PT at bedside. Pt slightly restless, eager to get out of bed. Able to be redirection with tactile/verbal cues. Pt requires MINA+2 for STS from recliner, attempted RW use however as pt is used to rollator, pt attempted to sit in the middle of RW. Pt assisted to the EOB to regroup. Pt completed simulated toilet t/f from EOB into recliner via step pivot transfer with MOD+2, extra assistance required for heavy multimodal cues for sequencing and pt safety due to impaired cognition limiting ability to follow one step directions. Pt making progress toward goals, will continue to follow POC. Discharge recommendation remains appropriate.        If plan is discharge home, recommend the following:  Two people to help with walking and/or transfers;Two people to help with bathing/dressing/bathroom;Assistance with feeding;Assistance with cooking/housework;Direct supervision/assist for medications management;Direct supervision/assist for financial management;Assist for transportation;Help with stairs or ramp for entrance;Supervision due to cognitive status   Equipment Recommendations  Other (comment)    Recommendations for Other Services      Precautions / Restrictions Precautions Precautions: Fall Recall of Precautions/Restrictions: Impaired Restrictions Weight Bearing Restrictions Per Provider Order: No       Mobility Bed Mobility               General bed mobility comments: Pt sitting EOB on arrival to room, check PT note for bed mobility  details    Transfers Overall transfer level: Needs assistance Equipment used: None Transfers: Sit to/from Stand, Bed to chair/wheelchair/BSC Sit to Stand: Min assist, +2 safety/equipment     Step pivot transfers: Mod assist, +2 safety/equipment     General transfer comment: Heavy multimodal cues for stand pivot technique into the recliner for sequening/safety.     Balance Overall balance assessment: Needs assistance Sitting-balance support: Feet supported Sitting balance-Leahy Scale: Good     Standing balance support: Bilateral upper extremity supported, During functional activity Standing balance-Leahy Scale: Poor                             ADL either performed or assessed with clinical judgement   ADL Overall ADL's : Needs assistance/impaired Eating/Feeding: Minimal assistance Eating/Feeding Details (indicate cue type and reason): Drinking from straw if cup placed in his palm                 Lower Body Dressing: Sitting/lateral leans;Maximal assistance   Toilet Transfer: Ambulation;Rolling walker (2 wheels);BSC/3in1;+2 for safety/equipment;Moderate assistance Toilet Transfer Details (indicate cue type and reason): MODA+2 for simulated toilet t/f         Functional mobility during ADLs: Moderate assistance;+2 for safety/equipment;Rolling walker (2 wheels) General ADL Comments: Attempted with RW however pt confused due to being used to a rollator, pt attempting to sit inside the RW.     Communication Communication Communication: Impaired Factors Affecting Communication: Difficulty expressing self;Hearing impaired   Cognition Arousal: Lethargic Behavior During Therapy: Flat affect, Restless Cognition: History of cognitive impairments  Following commands: Impaired Following commands impaired: Follows one step commands inconsistently      Cueing   Cueing Techniques: Verbal cues, Gestural cues, Tactile  cues, Visual cues  Exercises Exercises: Other exercises Other Exercises Other Exercises: Edu: Role of OT tx, sundowning prevention, safe ADL completion, fall prevention           General Comments Family at bedside    Pertinent Vitals/ Pain       Pain Assessment Pain Assessment: PAINAD Breathing: normal Negative Vocalization: none Facial Expression: smiling or inexpressive Body Language: relaxed Consolability: no need to console PAINAD Score: 0 Pain Intervention(s): Limited activity within patient's tolerance, Monitored during session, Repositioned                                                          Frequency  Min 2X/week        Progress Toward Goals  OT Goals(current goals can now be found in the care plan section)  Progress towards OT goals: Progressing toward goals  Acute Rehab OT Goals OT Goal Formulation: With family Time For Goal Achievement: 07/19/24 Potential to Achieve Goals: Fair ADL Goals Pt Will Perform Grooming: with min assist;sitting Pt Will Perform Lower Body Dressing: with min assist;sit to/from stand Pt Will Transfer to Toilet: with min assist;bedside commode  Plan      Co-evaluation    PT/OT/SLP Co-Evaluation/Treatment: Yes Reason for Co-Treatment: Complexity of the patient's impairments (multi-system involvement);For patient/therapist safety;To address functional/ADL transfers PT goals addressed during session: Mobility/safety with mobility OT goals addressed during session: ADL's and self-care      AM-PAC OT 6 Clicks Daily Activity     Outcome Measure   Help from another person eating meals?: A Lot Help from another person taking care of personal grooming?: Total Help from another person toileting, which includes using toliet, bedpan, or urinal?: Total Help from another person bathing (including washing, rinsing, drying)?: Total Help from another person to put on and taking off regular upper body  clothing?: Total Help from another person to put on and taking off regular lower body clothing?: Total 6 Click Score: 7    End of Session Equipment Utilized During Treatment: Gait belt  OT Visit Diagnosis: Unsteadiness on feet (R26.81);History of falling (Z91.81);Other symptoms and signs involving cognitive function   Activity Tolerance Patient tolerated treatment well   Patient Left with call bell/phone within reach;with nursing/sitter in room;with family/visitor present;in chair;with chair alarm set   Nurse Communication Mobility status;Other (comment) (In recliner)        Time: 8656-8592 OT Time Calculation (min): 24 min  Charges: OT General Charges $OT Visit: 1 Visit OT Treatments $Therapeutic Activity: 8-22 mins  Larraine Colas M.S. OTR/L  07/08/24, 3:50 PM

## 2024-07-08 NOTE — Consult Note (Signed)
 Consultation Note Date: 07/08/2024   Patient Name: Luke Chen  DOB: March 08, 1941  MRN: [id number]  Age / Sex: 83 y.o., male  PCP: P.A., Sheralyn Flock, MD Referring Physician: Dorinda Drue DASEN, MD  Reason for Consultation:  goals of care wife wants to be able to take patient home  HPI/Patient Profile: 83 y.o. male  with past medical history of dementia, DM- insulin  dependent, HTN, stroke, CHF, CAD, iron deficiency anemia on iron infusions, admitted on 07/04/2024 with generalized weakness. Workup revealed dehydration, UTI, and DKA. Palliative medicine consulted for goals of care.    Primary Decision Maker NEXT OF KIN- spouse- Luke Chen  Discussion: Chart reviewed including labs, progress notes, imaging from this and previous encounters.  Labs- WBC up 11.3, Cr 1.15. Glucose was over 400 on admission but has improved with treatment of DKA.  Per PT note- patient able to ambulate with rollator at baseline. Able to participate in therapies but follows commands inconsistently.  Noted per [facility] and attending note that spouse wishes to take patient home- avoid nursing facility.  On eval- patient was sitting up in chair, awake, alert. Able to tell me his name but did not answer any other questions.  No family at bedside.  I attempted to call patient's spouse- there was no answer, and no voicemail.    SUMMARY OF RECOMMENDATIONS -UTI, DKA in the setting of dementia- spouse wishes to take patient home -Unable to reach spouse or leave a message -PMT will continue to try and reach spouse -If patient is stable for discharge recommend referral for outpatient Palliative to see at home    Code Status/Advance Care Planning:   Code Status: Limited: Do not attempt resuscitation (DNR) -DNR-LIMITED -Do Not Intubate/DNI     Prognosis:   Unable to determine  Discharge Planning: Home with Home Health  Primary  Diagnoses: Present on Admission:  Hyperglycemia  Late onset Alzheimer's disease without behavioral disturbance (HCC)  Essential hypertension  Elevated troponin  AKI (acute kidney injury)   Review of Systems  Physical Exam  Vital Signs: BP 118/78 (BP Location: Right Arm)   Pulse (!) 105   Temp (!) 97.4 F (36.3 C)   Resp 20   Ht 5' 9 (1.753 m)   Wt 63.2 kg   SpO2 100%   BMI 20.59 kg/m  Pain Scale: PAINAD   Pain Score: 0-No pain   SpO2: SpO2: 100 % O2 Device:SpO2: 100 % O2 Flow Rate: .O2 Flow Rate (L/min): 3 L/min  IO: Intake/output summary:  Intake/Output Summary (Last 24 hours) at 07/08/2024 1358 Last data filed at 07/08/2024 0545 Gross per 24 hour  Intake --  Output 600 ml  Net -600 ml    LBM: Last BM Date : 09/05/23 Baseline Weight: Weight: 100 kg Most recent weight: Weight: 63.2 kg       Thank you for this consult. Palliative medicine will continue to follow and assist as needed.  Time Total: 65 minutes Signed by: Cassondra Stain, AGNP-C Palliative Medicine  Time includes:   Preparing to  see the patient (e.g., review of tests) Obtaining and/or reviewing separately obtained history Performing a medically necessary appropriate examination and/or evaluation Counseling and educating the patient/family/caregiver Ordering medications, tests, or procedures Referring and communicating with other health care professionals (when not reported separately) Documenting clinical information in the electronic or other health record Independently interpreting results (not reported separately) and communicating results to the patient/family/caregiver Care coordination (not reported separately) Clinical documentation   Please contact Palliative Medicine Team phone at [phone] for questions and concerns.  For individual provider: See Tracey

## 2024-07-08 NOTE — Inpatient Diabetes Management (Signed)
 Inpatient Diabetes Program Recommendations  AACE/ADA: New Consensus Statement on Inpatient Glycemic Control   Target Ranges:  Prepandial:   less than 140 mg/dL      Peak postprandial:   less than 180 mg/dL (1-2 hours)      Critically ill patients:  140 - 180 mg/dL    Latest Reference Range & Units 07/07/24 08:34 07/07/24 15:56 07/07/24 20:43 07/08/24 00:08 07/08/24 03:00 07/08/24 08:15  Glucose-Capillary 70 - 99 mg/dL 92 751 (H) 641 (H) 874 (H) 165 (H) 160 (H)   Review of Glycemic Control  Diabetes history: DM2 Outpatient Diabetes medications: Amaryl  2 mg daily, Metformin  1000 mg BID Current orders for Inpatient glycemic control: Novolog  0-9 units Q4H  Inpatient Diabetes Program Recommendations:    Insulin : If patient is eating well and if post prandial is consistently over 180 mg/dl, may want to consider adding Novolog  3 units TID with meals for meal coverage if patient eats at least 50% of meals.  Thanks, Earnie Gainer, RN, MSN, CDCES Diabetes Coordinator Inpatient Diabetes Program [phone] (Team Pager from 8am to 5pm)

## 2024-07-08 NOTE — Progress Notes (Signed)
 Progress Note   Patient: Luke Chen FMW:[id number] DOB: 12-May-1941 DOA: 07/04/2024     3 DOS: the patient was seen and examined on 07/08/2024    Brief hospital course:   From HPI Luke Chen is a 83 y.o. 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  He was brought in by EMS from home due to concerns for weakness.  Wife at the bedside who gives a history, states that he had been declining for some time, sleeping for most of the day.  States the iron infusions have not been helping him as expected.  Due to increased concern she called EMS to bring him in.  Triage note states blood glucose of 417. In the ED, very frail-appearing male.  Initial vitals unremarkable except for somewhat soft blood pressure with systolic 95-1 02.8 1 Labs notable for WBC 12.1, hemoglobin 10.7, down from 11.2 on 10/8 Blood glucose 294 with anion gap 19 and bicarb 18.  VBG unremarkable and beta hydroxybutyric acid 0.39 Creatinine 1.36 up from baseline of 0.9 on 10/7 Troponin 143--128 VBG unremarkable CK1 48 UA pending. EKG showing sinus at 92 with no acute ST-T wave changes Chest x-ray nonacute, CT head nonacute   Patient was started on an insulin  drip due to concern for DKA along with fluids and electrolyte repletion per DKA   Admission requested    Assessment and Plan: Generalized weakness Possible urinary tract infection Dehydration/hypotension Suspect multifactorial, including dehydration, possible underlying infection, hyperglycemia, with background of anemia and frailty  Troponin elevated but downtrended so low suspicion for ACS Urinalysis shows possible UTI Continue ceftriaxone  Fall and aspiration precautions PT OT  consulted and recommending SNF     Hyperglycemia Patient has been weaned off insulin  drip Continue as needed insulin    Elevated troponin CAD s/p stent to LCx and LAD Suspect demand ischemia.  Troponin 143-> 128   AKI (acute kidney injury)-resolved Creatinine 1.36 up from baseline of 0.9 on 10/7 Renal function currently back to baseline Monitor renal function and avoid nephrotoxins   Chronic HFrEF (heart failure with reduced ejection fraction) (HCC) Currently euvolemic Holding GDMT meds due to soft blood pressure Monitor input and output as well as daily weights   Chronic anticoagulation History of LV thrombus Continue apixaban    IDA (iron deficiency anemia) On iron transfusion, last transfusion 11/17 Hemoglobin fairly stable   Dementia (HCC) Frailty Delirium precautions Continue Aricept  Fall and aspiration precautions One-to-one precaution ordered   Essential hypertension Holding antihypertensives due to soft blood pressure       DVT prophylaxis: apixaban    Consults: none   Advance Care Planning:  DNR/DNI   Family Communication: wife at bedside   Disposition Plan: PT OT recommending SNF however wife looking forward to taking patient home, palliative care consulted for goals of care discussion   Physical Exam:   Vitals and nursing note reviewed.  Constitutional: Laying in bed in no acute distress HENT:     Head: Normocephalic and atraumatic.  Cardiovascular:     Rate and Rhythm: Normal rate and regular rhythm.     Heart sounds: Normal heart sounds.  Pulmonary:     Effort: Pulmonary effort is normal.     Breath sounds: Normal breath sounds.  Abdominal:     Palpations: Abdomen  is soft.     Tenderness: There is no abdominal tenderness.  Neurological: Sedated and symptoms of dementia    General: No focal deficit present.    Subjective: TOC working on long-term care placement Patient's wife wishes to take patient home at discharge Palliative care  consulted for goals of care discussion   Data Reviewed:     Vitals:   07/08/24 0006 07/08/24 0248 07/08/24 0813 07/08/24 1213  BP: 113/73 (!) 143/112 105/75 118/78  Pulse: 75 97 60 (!) 105  Resp: 20 20 16 20   Temp: (!) 97.5 F (36.4 C) 97.9 F (36.6 C) (!) 97.4 F (36.3 C) (!) 97.4 F (36.3 C)  TempSrc:      SpO2: 93% 100% 100% 100%  Weight:      Height:          Latest Ref Rng & Units 07/08/2024    5:24 AM 07/07/2024    5:23 AM 07/06/2024    6:06 AM  BMP  Glucose 70 - 99 mg/dL 873  866  60   BUN 8 - 23 mg/dL 42  37  39   Creatinine 0.61 - 1.24 mg/dL 8.84  8.90  8.89   Sodium 135 - 145 mmol/L 140  141  141   Potassium 3.5 - 5.1 mmol/L 4.4  3.9  3.6   Chloride 98 - 111 mmol/L 109  109  108   CO2 22 - 32 mmol/L 18  22  22    Calcium  8.9 - 10.3 mg/dL 9.0  9.0  8.8        Latest Ref Rng & Units 07/08/2024    5:24 AM 07/07/2024    5:23 AM 07/06/2024    8:28 AM  CBC  WBC 4.0 - 10.5 K/uL 11.3  8.7  10.6   Hemoglobin 13.0 - 17.0 g/dL 89.3  89.6  89.4   Hematocrit 39.0 - 52.0 % 32.9  31.1  32.6   Platelets 150 - 400 K/uL 224  194  198      Author: Drue ONEIDA Potter, MD 07/08/2024 3:21 PM  For on call review [email or web address].

## 2024-07-08 NOTE — Progress Notes (Signed)
 Pt more alert today and restless, able to transfer to EOB with MinA for safety. Very impulsive with difficulty following commands. Pt able to stand and take a few steps to bedside chair with MinA of 2, very unsafe, stating he is ready to leave. Made pt comfortable up in recliner with sitter and family at bedside. No O2 needs this date, SpO2 remained in mid 90's. Continue PT per POC.   07/08/24 1600  PT Visit Information  Assistance Needed +2  Reason for Co-Treatment Complexity of the patient's impairments (multi-system involvement);For patient/therapist safety;To address functional/ADL transfers  PT goals addressed during session Mobility/safety with mobility  OT goals addressed during session ADL's and self-care  History of Present Illness Luke Chen is an 83yoM who comes to [facility] on 11/21 via [facility], wife called EMS after pt felt weak and lie down on the floor. BG in 400s. 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  Precautions  Precautions Fall  Recall of Precautions/Restrictions Impaired  Restrictions  Weight Bearing Restrictions Per Provider Order No  Pain Assessment  Pain Assessment PAINAD  Breathing 0  Negative Vocalization 0  Facial Expression 0  Body Language 0  Consolability 0  PAINAD Score 0  Pain Intervention(s) Limited activity within patient's tolerance  Cognition  Arousal Lethargic  Behavior During Therapy Flat affect;Restless  PT - Cognitive impairments History of cognitive impairments  Following Commands  Following commands Impaired  Following commands impaired Follows one step commands inconsistently  Cueing  Cueing Techniques Verbal cues;Gestural cues;Tactile cues;Visual cues  Communication  Communication Impaired  Factors Affecting Communication Difficulty expressing self;Hearing impaired  Bed Mobility  Overal bed mobility Needs Assistance  Bed Mobility Supine to Sit  Supine to sit Min assist;HOB elevated;Used rails  General bed  mobility comments  (MinA for safety.)  Transfers  Overall transfer level Needs assistance  Equipment used None  Transfers Sit to/from Stand;Bed to chair/wheelchair/BSC  Sit to Stand Min assist;+2 safety/equipment  General transfer comment  (Pt stood at EOB with A of 2 very unsafe, poor safety awareness. Placed RW in front of pt which pt attempted to turn and sit on (he has a Rollator at home))  Ambulation/Gait  General Gait Details  (No true gait training due to Increased HR from supine to sit to standing concerning for drop in BP. Pt assisted a few steps to recliner with Min/ModA x2)  Balance  Overall balance assessment Needs assistance  Sitting-balance support Feet supported  Sitting balance-Leahy Scale Good  Standing balance support Bilateral upper extremity supported;During functional activity  Standing balance-Leahy Scale Poor  Standing balance comment High fall risk  General Comments  General comments (skin integrity, edema, etc.)  (Family and sitter at bedside)  Exercises  Exercises Other exercises  Other Exercises  Other Exercises Edu: Role of PT tx, sundowning prevention, safe mobility, fall prevention  PT - End of Session  Equipment Utilized During Treatment Gait belt;Oxygen   Activity Tolerance Treatment limited secondary to medical complications (Comment);Other (comment) (delerium and following commands)  Patient left in chair;with call bell/phone within reach;with nursing/sitter in room;with family/visitor present  Nurse Communication Mobility status   PT - Assessment/Plan  PT Visit Diagnosis Other abnormalities of gait and mobility (R26.89);Muscle weakness (generalized) (M62.81)  PT Frequency (ACUTE ONLY) Min 1X/week  Follow Up Recommendations Skilled nursing-short term rehab (<3 hours/day)  Can patient physically be transported by private vehicle No  Patient can return home with the following A lot of help with walking  and/or transfers;A lot of help with  bathing/dressing/bathroom;Assistance with cooking/housework;Direct supervision/assist for medications management;Direct supervision/assist for financial management;Assist for transportation;Help with stairs or ramp for entrance;Supervision due to cognitive status  PT equipment None recommended by PT  AM-PAC PT 6 Clicks Mobility Outcome Measure (Version 2)  Help needed turning from your back to your side while in a flat bed without using bedrails? 2  Help needed moving from lying on your back to sitting on the side of a flat bed without using bedrails? 2  Help needed moving to and from a bed to a chair (including a wheelchair)? 3  Help needed standing up from a chair using your arms (e.g., wheelchair or bedside chair)? 3  Help needed to walk in hospital room? 2  Help needed climbing 3-5 steps with a railing?  2  6 Click Score 14  Consider Recommendation of Discharge To: CIR/SNF/LTACH  Progressive Mobility  What is the highest level of mobility based on the mobility assessment? Level 3 (Stands with assistance) - Balance while standing  and cannot march in place  Activity Stood at bedside;Pivoted/transferred from bed to chair  PT Time Calculation  PT Start Time (ACUTE ONLY) 1325  PT Stop Time (ACUTE ONLY) 1407  PT Time Calculation (min) (ACUTE ONLY) 42 min  PT General Charges  $$ ACUTE PT VISIT 1 Visit  PT Treatments  $Therapeutic Activity 23-37 mins  Darice Bohr, PTA 07/08/2024

## 2024-07-09 DIAGNOSIS — E1310 Other specified diabetes mellitus with ketoacidosis without coma: Secondary | ICD-10-CM | POA: Diagnosis not present

## 2024-07-09 DIAGNOSIS — R531 Weakness: Secondary | ICD-10-CM | POA: Diagnosis not present

## 2024-07-09 DIAGNOSIS — Z515 Encounter for palliative care: Secondary | ICD-10-CM | POA: Diagnosis not present

## 2024-07-09 DIAGNOSIS — F01B0 Vascular dementia, moderate, without behavioral disturbance, psychotic disturbance, mood disturbance, and anxiety: Secondary | ICD-10-CM | POA: Diagnosis not present

## 2024-07-09 LAB — CBC WITH DIFFERENTIAL/PLATELET
Abs Immature Granulocytes: 0.03 K/uL (ref 0.00–0.07)
Basophils Absolute: 0 K/uL (ref 0.0–0.1)
Basophils Relative: 0 %
Eosinophils Absolute: 0.1 K/uL (ref 0.0–0.5)
Eosinophils Relative: 2 %
HCT: 32.2 % — ABNORMAL LOW (ref 39.0–52.0)
Hemoglobin: 10.6 g/dL — ABNORMAL LOW (ref 13.0–17.0)
Immature Granulocytes: 0 %
Lymphocytes Relative: 29 %
Lymphs Abs: 2.7 K/uL (ref 0.7–4.0)
MCH: 29.2 pg (ref 26.0–34.0)
MCHC: 32.9 g/dL (ref 30.0–36.0)
MCV: 88.7 fL (ref 80.0–100.0)
Monocytes Absolute: 0.8 K/uL (ref 0.1–1.0)
Monocytes Relative: 9 %
Neutro Abs: 5.7 K/uL (ref 1.7–7.7)
Neutrophils Relative %: 60 %
Platelets: 204 K/uL (ref 150–400)
RBC: 3.63 MIL/uL — ABNORMAL LOW (ref 4.22–5.81)
RDW: 21.1 % — ABNORMAL HIGH (ref 11.5–15.5)
WBC: 9.4 K/uL (ref 4.0–10.5)
nRBC: 0.2 % (ref 0.0–0.2)

## 2024-07-09 LAB — GLUCOSE, CAPILLARY
Glucose-Capillary: 98 mg/dL (ref 70–99)
Glucose-Capillary: 85 mg/dL (ref 70–99)
Glucose-Capillary: 126 mg/dL — ABNORMAL HIGH (ref 70–99)
Glucose-Capillary: 136 mg/dL — ABNORMAL HIGH (ref 70–99)
Glucose-Capillary: 187 mg/dL — ABNORMAL HIGH (ref 70–99)
Glucose-Capillary: 150 mg/dL — ABNORMAL HIGH (ref 70–99)

## 2024-07-09 LAB — BASIC METABOLIC PANEL WITH GFR
Anion gap: 13 (ref 5–15)
BUN: 46 mg/dL — ABNORMAL HIGH (ref 8–23)
CO2: 19 mmol/L — ABNORMAL LOW (ref 22–32)
Calcium: 9 mg/dL (ref 8.9–10.3)
Chloride: 108 mmol/L (ref 98–111)
Creatinine, Ser: 1.07 mg/dL (ref 0.61–1.24)
GFR, Estimated: >60 mL/min (ref >60)
Glucose, Bld: 87 mg/dL (ref 70–99)
Potassium: 4.1 mmol/L (ref 3.5–5.1)
Sodium: 140 mmol/L (ref 135–145)

## 2024-07-09 MED ORDER — ACETAMINOPHEN 325 MG PO TABS
650.0000 mg | ORAL_TABLET | Freq: Three times a day (TID) | ORAL | Status: DC
  Administered 2024-07-09 – 2024-07-17 (×25): 650 mg via ORAL
  Filled 2024-07-09 (×26): qty 2

## 2024-07-09 NOTE — Hospital Course (Addendum)
 Hospital course / significant events:   Luke Chen is a 83 y.o. 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   HPI: Wife at the bedside who gives a history, states that he had been declining for some time, sleeping for most of the day. States the iron infusions have not been helping him as expected. Due to increased concern she called EMS to bring him in.   11/21: to ED. Started insulin  drip.  11/22: admitted to hospitalist service, hyperglycemia, hypotension, AKI, failure to thrive. Continue abx, cautious hydration. Got Haldol  x1 for sundowning.  11/23: hypotensive, adjusting cardiac meds. PT/OT recs for LTC 11/24:  wife has goal for pt to go abck home and not LTC. Palliative care consulted 11/25: palliative care unable to reach pt's wife.  11/26: palliative care met w/ patient's wife. On rounds, I also discussed goals / options. Wife is back and forth - TOC following, see A/P. Pt has received 5 days abx, UCx mult species, will give last dose abx today 11/27: unable to reach pts wife. Clinically no change but pt appears comfortable. last dose Haldol  was overnight 11/27 00:52 11/28: d/w wife at bedside, dispo still in process, see below, pt not improving. PT/OT to try to see this afternoon while wife is present --> see IPAL note. Confirm for DNR which is appropriate. Otherwise treat as able, will work on SNF rehab placement  11/29: hypoxic overnight, lasix ordered. This AM CT Chest (+)effusion --> thora. Continue lasix. Palliative care saw pt today and d/w wife.  11/30: 3.2L UOP yesterday, mild AKI now, will get 1 dose lasix this AM and continue monitor output. Pt appears comfortable. See A/P for GOC discussion  12/01: Pt remains minimally alert, he is eating some w/ encouragement.  [facility] denied transfer. Repeat CXR clear. Cardiology consult - nothing further to offer at this time. Plan for now to ensure medical stability over next 1-2 days, if he remains stable Mrs. Mort will have to decide regarding SNF, TOC following  12/02: wife refusing insulin  unless Glc >200, custom sliding scale ordered. Pt stable through today.      Consultants:  Palliative care  Interventional Radiology Cardiology   Procedures/Surgeries: L Thoracentesis 07/12/24 removal 400 cc      ASSESSMENT & PLAN:   Generalized weakness Dehydration/hypotension Debility  Suspect multifactorial, including dehydration, possible underlying infection, hyperglycemia, with background of anemia and frailty  Troponin elevated but downtrended so low suspicion for ACS Urinalysis shows possible UTI, UCx mult species  Fall and aspiration precautions PT OT consulted and recommending SNF - see below    Hyperglycemia on admission DKA, resolved Hypoglycemia d/t poor po intake  Patient has been weaned off insulin  drip SSI insulin  q4h was held d/t hypoglycemia now reinitiated d/t Glc 200+ without substantial po intake, pt's wife is refusing insulin  unless Glc >200, custom sliding scale ac ordered.   D50 prn   Possible urinary tract infection but ruled out Urinalysis showed possible UTI, UCx mult species  Completed ceftriaxone   Acute hypoxic respiratory failure d/t pleural effusion - improved s/p thoracentesis  Underlying COPD but not on home O2, no apparent exacerbation at this time Repeat CXR prn O2 prn  COPD as below   Chronic HFrEF (heart failure with reduced ejection fraction) with acute exacerbation Echo last month at  [facility] EF 20 Have had to hold GDMT meds due to soft blood pressure Monitor input and output Lasix per cardiology - held today w/ clear CXR  Cardiology consulted - not much more to offer at this point, high risk patient   Dementia with behavioral disturbance I have discussed w/ Mrs.  Valbuena that he is fitting the clinical diagnosis of worsening vascular dementia. I affirmed that an abrupt change like this can be very difficult to witness and to manage. Mrs. Tegtmeyer believes that sedating medication is cause for his problems now. Note: last dose Haldol  was 11/27 00:52, >72h ago. I have affirmed that sedating medication can certainly alter a patient temporarily but these medications are not affecting him now. I have been firm that I will support use of these medications if there is a need to use them for his safety or for the staff's safety. We will try to not use meds but I have been clear that I do not think such medications are the main factor in his decline, but rather the dementia is the problem in addition to the DKA/AKI, CHF, poor po intake  Delirium precautions Continue Aricept  Fall and aspiration precautions One-to-one precaution  Haldol  prn - please limit use to absolute necessity for safety and then at lowest effective dose Palliative has signed off but can reengage as needed      COPD not in exacerbation Suspect might be some chronic hypoxia Bronchodilators prn  O2 prn  Elevated troponin CAD s/p stent to LCx and LAD Suspect demand ischemia.  Troponin 143-> 128 Recheck as needed   AKI (acute kidney injury)-resolved Creatinine 1.36 up from baseline of 0.9 on 10/7 Renal function back to baseline Monitor renal function and avoid nephrotoxins    Chronic anticoagulation History of LV thrombus apixiban    IDA (iron deficiency anemia) On iron transfusion, last transfusion 11/17 Monitor CBC Hemoglobin fairly stable   Essential hypertension Holding antihypertensives due to soft blood pressure and also he is getting lasix periodically   Advanced care planning See previous notes for daily conversations  12/02: wife has no questions today. I let her know if he remains stable into tomorrow we will probably dc tele and need to make decisions about SNF    No  concerns based on BMI: Body mass index is 20.59 kg/m.SABRA Significantly low or high BMI is associated with higher medical risk.  Underweight - under 18  overweight - 25 to 29 obese - 30 or more Class 1 obesity: BMI of 30.0 to 34 Class 2 obesity: BMI of 35.0 to 39 Class 3 obesity: BMI of 40.0 to 49 Super Morbid Obesity: BMI 50-59 Super-super Morbid Obesity: BMI 60+ Healthy nutrition and physical activity advised as adjunct to other disease management and risk reduction treatments    DVT prophylaxis: eliquis  - holding for now  IV fluids: no continuous IV fluids  Nutrition: as tolerated Central lines / other devices: none  Code Status: DNR ACP documentation reviewed: none on file in [facility]  TOC needs: placement Medical barriers to dispo: monitoring fluid status CHF but expect medically optimized soon.

## 2024-07-09 NOTE — Plan of Care (Signed)
°  Problem: Clinical Measurements: °Goal: Ability to maintain clinical measurements within normal limits will improve °Outcome: Progressing °  °Problem: Elimination: °Goal: Will not experience complications related to bowel motility °Outcome: Progressing °  °Problem: Safety: °Goal: Ability to remain free from injury will improve °Outcome: Progressing °  °Problem: Skin Integrity: °Goal: Risk for impaired skin integrity will decrease °Outcome: Progressing °  °

## 2024-07-09 NOTE — Progress Notes (Addendum)
 Daily Progress Note   Patient Name: Luke Chen       Date: 07/09/2024 DOB: Aug 03, 1941  Age: 83 y.o. MRN#: [id number] Attending Physician: Marsa Edelman, DO Primary Care Physician: CONCHA Sheralyn Flock, MD Admit Date: 07/04/2024  Reason for Consultation/Follow-up: Establishing goals of care  Patient Profile/HPI:  83 y.o. male  with past medical history of dementia, DM- insulin  dependent, HTN, stroke, CHF, CAD, iron deficiency anemia on iron infusions, admitted on 07/04/2024 with generalized weakness. Workup revealed dehydration, UTI, and DKA. Palliative medicine consulted for goals of care.    Subjective: Chart reviewed including labs, progress notes, imaging from this and previous encounters.  CBC shows WBC down- 9.4 from 11.3 yesterday. Meals documented at 100% Discussed with child psychotherapist and attending MD. Spouse at bedside was inquiring about hospice.  Met with spouse [name] at bedside.  Patient sleeping. He had a difficult night last night per [name], was restless. Nursing attempted to give him something to calm him, but she requested he received tylenol  instead and that worked.  Prior to admission patient was able to ambulate in the home. He would go with his spouse on outings. Able to shave himself. Able to feed himself. If he is asked about a topic that interests him he can speak on it extensively. Otherwise he tends to answer in one word responses.  Reviewed patient's chronic and acute illnesses. Discussed trajectory of dementia and how it can be affected by acute insults often with patients recovering some function but not completely back to how well before the insult.  Hospice services and philosophy of care were discussed. Palliative services were also discussed.  [name] shares  that she is not ready to invoke hospice at this point in time. She wants to try and get patient stronger with therapies at home. She agrees to outpatient Palliative and if patient fails to thrive once home then she will request hospice.    Review of Systems  Unable to perform ROS: Mental status change     Physical Exam Vitals and nursing note reviewed.  Constitutional:      Comments: frail  Cardiovascular:     Rate and Rhythm: Normal rate.  Pulmonary:     Effort: Pulmonary effort is normal.  Neurological:     Comments: sleeping  Vital Signs: BP 98/61 (BP Location: Right Arm)   Pulse 72   Temp (!) 97.4 F (36.3 C)   Resp 20   Ht 5' 9 (1.753 m)   Wt 63.2 kg   SpO2 99%   BMI 20.59 kg/m  SpO2: SpO2: 99 % O2 Device: O2 Device: Room Air O2 Flow Rate: O2 Flow Rate (L/min): 3 L/min  Intake/output summary:  Intake/Output Summary (Last 24 hours) at 07/09/2024 1205 Last data filed at 07/09/2024 0900 Gross per 24 hour  Intake 120 ml  Output 1100 ml  Net -980 ml   LBM: Last BM Date : 07/07/24 (per wife) Baseline Weight: Weight: 100 kg Most recent weight: Weight: 63.2 kg       Palliative Assessment/Data: PPS: 50%      Patient Active Problem List   Diagnosis Date Noted   Hyperglycemia 07/05/2024   Generalized weakness 07/05/2024   IDA (iron deficiency anemia) 07/05/2024   Frailty 07/05/2024   Chronic HFrEF (heart failure with reduced ejection fraction) (HCC) 07/05/2024   Chronic anticoagulation 07/05/2024   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) 07/05/2024   Dementia (HCC)    AMS (altered mental status) 12/18/2023   Impaired ambulation 12/18/2023   AKI (acute kidney injury) 12/18/2023   UTI (urinary tract infection) 12/18/2023   Sepsis (HCC) 12/18/2023   Hematuria 01/02/2022   Hospital discharge follow-up 11/22/2020   Rectal exam 05/31/2020   Diabetes 1.5, 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/2020   Essential hypertension 03/23/2020   Late onset  Alzheimer's disease without behavioral disturbance (HCC) 03/23/2020   COVID-19 virus infection 03/23/2020   History of 2019 novel coronavirus disease (COVID-19) 03/23/2020   Elevated troponin 02/05/2019    Palliative Care Assessment & Plan    Assessment/Recommendations/Plan  Dementia- likely vascular due to history of stroke- moderate, no agitation- continue current interventions- GOC are to treat what is treatable- spouse agrees to outpatient Palliative- if patient fails to thrive at home with PT then she would like to transition to hospice Generalized pain- will scheduled acetaminophen  650mg  po TID- per spouse patient responds well to tylenol  and does not do well with other medications   Code Status:   Code Status: Limited: Do not attempt resuscitation (DNR) -DNR-LIMITED -Do Not Intubate/DNI    Prognosis:  Unable to determine  Discharge Planning: Home with Palliative Services  Care plan was discussed with patient's spouse, attending MD, LCSW.  Thank you for allowing the Palliative Medicine Team to assist in the care of this patient.  Total time:  70 minutes Prolonged billing:  Time includes:   Preparing to see the patient (e.g., review of tests) Obtaining and/or reviewing separately obtained history Performing a medically necessary appropriate examination and/or evaluation Counseling and educating the patient/family/caregiver Ordering medications, tests, or procedures Referring and communicating with other health care professionals (when not reported separately) Documenting clinical information in the electronic or other health record Independently interpreting results (not reported separately) and communicating results to the patient/family/caregiver Care coordination (not reported separately) Clinical documentation  Cassondra Stain, AGNP-C Palliative Medicine   Please contact Palliative Medicine Team phone at [phone] for questions and concerns.

## 2024-07-09 NOTE — Inpatient Diabetes Management (Signed)
 Inpatient Diabetes Program Recommendations  AACE/ADA: New Consensus Statement on Inpatient Glycemic Control   Target Ranges:  Prepandial:   less than 140 mg/dL      Peak postprandial:   less than 180 mg/dL (1-2 hours)      Critically ill patients:  140 - 180 mg/dL    Latest Reference Range & Units 07/08/24 08:15 07/08/24 12:14 07/08/24 15:48 07/08/24 19:53 07/08/24 23:50 07/09/24 03:57 07/09/24 08:32  Glucose-Capillary 70 - 99 mg/dL 839 (H) 687 (H) 744 (H) 268 (H) 190 (H) 98 85   Review of Glycemic Control  Diabetes history: DM2 Outpatient Diabetes medications: Amaryl  2 mg daily, Metformin  1000 mg BID Current orders for Inpatient glycemic control: Novolog  0-9 units Q4H   Inpatient Diabetes Program Recommendations:     Insulin : If patient is eating well and if post prandial is consistently over 180 mg/dl, change CBGs to AC&HS, Novolog  0-9 units TID with meals, and adding Novolog  3 units TID with meals for meal coverage if patient eats at least 50% of meals.   Thanks, Earnie Gainer, RN, MSN, CDCES Diabetes Coordinator Inpatient Diabetes Program [phone] (Team Pager from 8am to 5pm)

## 2024-07-09 NOTE — Progress Notes (Signed)
 PROGRESS NOTE    Luke Chen   FMW:[id number] DOB: Nov 15, 1940  DOA: 07/04/2024 Date of Service: 07/09/24 which is hospital day 4  PCP: P.A., Sheralyn Flock, MD    Hospital course / significant events:   [name] is a 83 y.o. 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   HPI: Wife at the bedside who gives a history, states that he had been declining for some time, sleeping for most of the day. States the iron infusions have not been helping him as expected. Due to increased concern she called EMS to bring him in.   11/21: to ED. Started insulin  drip.  11/22: admitted to hospitalist service, hyperglycemia, hypotension, AKI, failure to thrive. Continue abx, cautious hydration.  11/23: hypotensive, adjusting cardiac meds. PT/OT recs for LTC 11/24:  wife has goal for pt to go abck home and not LTC. Given concern that pt is not safe to go home, palliative care consulted 11/25: palliative care consult, unable to reach pt's wife.  11/26: palliative care met w/ patient's wife. On rounds, I also discussed goals / options. Wife is back and forth - TOC following, see A/P.     Consultants:  Palliative care   Procedures/Surgeries: none      ASSESSMENT & PLAN:   Generalized weakness Possible urinary tract infection Dehydration/hypotension Suspect multifactorial, including dehydration, possible underlying infection, hyperglycemia, with background of anemia and frailty  Troponin elevated but downtrended so low suspicion for ACS Urinalysis shows possible UTI Continue ceftriaxone  Fall and aspiration precautions PT OT consulted and recommending SNF - see below    Hyperglycemia Patient has been weaned off insulin  drip Continue as needed insulin    Elevated  troponin CAD s/p stent to LCx and LAD Suspect demand ischemia.  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 She was under impression home health would include more intensive / frequent home visits, she had not wanted hospice but now is open to it. TOC following and wife considering options.  I do not think pt is safe for home but if Mrs. Linder wants him home then we will facilitate that but I suspect he will soon need to come back to the ED as she is not able to move him for basic care needs.  I recommended based on goals: if goal for home then home hospice would be best but may need to look at hospice at facility if she cannot care for him, if goal for maximize time  and intensity of care then SNF would be best and would still want palliative/hospice to follow.  I advised Mrs. Dugar meet with hospice one way or another to discuss options, since if we dc h9im home w/ outpatient palliative this is not something that can start hospice services without more advanced  notice (Mrs. Fambro though could call hospice tomorrow if pt dc today and not doing well at home).    No concerns based on BMI: Body mass index is 20.59 kg/m.SABRA Significantly low or high BMI is associated with higher medical risk.  Underweight - under 18  overweight - 25 to 29 obese - 30 or more Class 1 obesity: BMI of 30.0 to 34 Class 2 obesity: BMI of 35.0 to 39 Class 3 obesity: BMI of 40.0 to 49 Super Morbid Obesity: BMI 50-59 Super-super Morbid Obesity: BMI 60+ Healthy nutrition and physical activity advised as adjunct to other disease management and risk reduction treatments    DVT prophylaxis: eliquis  IV fluids: no continuous IV fluids  Nutrition: as toelrated Central lines / other devices: none  Code Status: DNR ACP documentation reviewed: none on file in [facility]  [facility] needs: placement/ home services  Medical barriers to dispo: none.             Subjective / Brief ROS:  Patient not contibutory  Family Communication: see A/P    Objective Findings:  Vitals:   07/09/24 0831 07/09/24 1000 07/09/24 1138 07/09/24 1619  BP: 104/77  98/61 126/86  Pulse: 83  72 97  Resp: 16  20 19   Temp: (!) 97.4 F (36.3 C)  (!) 97.4 F (36.3 C) (!) 97.4 F (36.3 C)  TempSrc:      SpO2: 94% 99% 99%   Weight:      Height:        Intake/Output Summary (Last 24 hours) at 07/09/2024 1738 Last data filed at 07/09/2024 0900 Gross per 24 hour  Intake 120 ml  Output 1100 ml  Net -980 ml   Filed Weights   07/04/24 2347 07/05/24 2000 07/06/24 0326  Weight: 54.4 kg 63.9 kg 63.2 kg    Examination:  Physical Exam Constitutional:      General: He is not in acute distress. Cardiovascular:     Rate and Rhythm: Normal rate and regular rhythm.  Pulmonary:     Effort: Pulmonary effort is normal.     Breath sounds: Normal breath sounds.  Abdominal:     Palpations: Abdomen is soft.  Musculoskeletal:     Right lower leg: No edema.     Left lower leg: No edema.  Skin:     General: Skin is warm and dry.     Coloration: Skin is pale.  Neurological:     Mental Status: He is alert. He is disoriented.  Psychiatric:        Behavior: Behavior normal.          Scheduled Medications:   acetaminophen   650 mg Oral TID   apixaban   5 mg Oral BID   atorvastatin   40 mg Oral Daily   donepezil   10 mg Oral Daily   insulin  aspart  0-9 Units Subcutaneous Q4H    Continuous Infusions:  cefTRIAXone  (ROCEPHIN )  IV 1 g (07/09/24 1632)    PRN Medications:  acetaminophen  **OR** acetaminophen , ondansetron  **OR** ondansetron  (ZOFRAN ) IV, mouth rinse  Antimicrobials from admission:  Anti-infectives (From admission, onward)    Start     Dose/Rate Route Frequency Ordered Stop   07/05/24 1730  cefTRIAXone  (ROCEPHIN ) 1 g in sodium chloride  0.9 % 100 mL IVPB        1 g 200 mL/hr over 30 Minutes Intravenous Every 24 hours 07/05/24 1622             Data Reviewed:  I have personally reviewed the following...  CBC: Recent Labs  Lab 07/05/24 0604 07/06/24 0828 07/07/24 0523 07/08/24 0524 07/09/24 0526  WBC 10.5 10.6* 8.7 11.3* 9.4  NEUTROABS  --  7.3 5.4 7.9* 5.7  HGB 10.1* 10.5* 10.3* 10.6* 10.6*  HCT 31.3* 32.6* 31.1* 32.9* 32.2*  MCV 88.7 89.8 88.9 91.6 88.7  PLT 210 198 194 224 204   Basic Metabolic Panel: Recent Labs  Lab 07/05/24 1136 07/06/24 0606 07/07/24 0523 07/08/24 0524 07/09/24 0526  NA 139 141 141 140 140  K 4.3 3.6 3.9 4.4 4.1  CL 106 108 109 109 108  CO2 18* 22 22 18* 19*  GLUCOSE 202* 60* 133* 126* 87  BUN 39* 39* 37* 42* 46*  CREATININE 1.21 1.10 1.09 1.15 1.07  CALCIUM  9.2 8.8* 9.0 9.0 9.0   GFR: Estimated Creatinine Clearance: 48.4 mL/min (by C-G formula based on SCr of 1.07 mg/dL). Liver Function Tests: No results for input(s): AST, ALT, ALKPHOS, BILITOT, PROT, ALBUMIN in the last 168 hours. No results for input(s): LIPASE, AMYLASE in the last 168 hours. No results for input(s): AMMONIA in the last 168  hours. Coagulation Profile: No results for input(s): INR, PROTIME in the last 168 hours. Cardiac Enzymes: Recent Labs  Lab 07/04/24 2102  CKTOTAL 148   BNP (last 3 results) No results for input(s): PROBNP in the last 8760 hours. HbA1C: No results for input(s): HGBA1C in the last 72 hours. CBG: Recent Labs  Lab 07/08/24 2350 07/09/24 0357 07/09/24 0832 07/09/24 1139 07/09/24 1621  GLUCAP 190* 98 85 126* 136*   Lipid Profile: No results for input(s): CHOL, HDL, LDLCALC, TRIG, CHOLHDL, LDLDIRECT in the last 72 hours. Thyroid  Function Tests: No results for input(s): TSH, T4TOTAL, FREET4, T3FREE, THYROIDAB in the last 72 hours. Anemia Panel: No results for input(s): VITAMINB12, FOLATE, FERRITIN, TIBC, IRON, RETICCTPCT in the last 72 hours. Most Recent Urinalysis On File:     Component Value Date/Time   COLORURINE AMBER (A) 07/05/2024 0441   APPEARANCEUR HAZY (A) 07/05/2024 0441   LABSPEC 1.020 07/05/2024 0441   PHURINE 5.0 07/05/2024 0441   GLUCOSEU >=500 (A) 07/05/2024 0441   HGBUR MODERATE (A) 07/05/2024 0441   BILIRUBINUR NEGATIVE 07/05/2024 0441   BILIRUBINUR neg 01/02/2022 1407   KETONESUR NEGATIVE 07/05/2024 0441   PROTEINUR 100 (A) 07/05/2024 0441   UROBILINOGEN negative (A) 01/02/2022 1407   NITRITE NEGATIVE 07/05/2024 0441   LEUKOCYTESUR TRACE (A) 07/05/2024 0441   Sepsis Labs: @LABRCNTIP (procalcitonin:4,lacticidven:4) Microbiology: Recent Results (from the past 240 hours)  MRSA Next Gen by PCR, Nasal     Status: None   Collection Time: 07/05/24  2:00 AM   Specimen: Nasal Mucosa; Nasal Swab  Result Value Ref Range Status   MRSA by PCR Next Gen NOT DETECTED NOT DETECTED Final    Comment: (NOTE) The GeneXpert MRSA Assay (FDA approved for NASAL specimens only), is one component of a comprehensive MRSA colonization surveillance program. It is not intended to diagnose MRSA infection nor to guide or monitor treatment  for MRSA infections. Test performance is not FDA approved in patients less than 72 years old. Performed at [facility], [address] [place], [place] [address]   Urine Culture  Status: Abnormal   Collection Time: 07/05/24  4:41 AM   Specimen: Urine, Random  Result Value Ref Range Status   Specimen Description   Final    URINE, RANDOM Performed at [facility], [address] [place], [place] [address]    Special Requests   Final    NONE Reflexed from [id number] Performed at [facility], [address] [place], [place] [address]    Culture MULTIPLE SPECIES PRESENT, SUGGEST RECOLLECTION (A)  Final   Report Status 07/06/2024 FINAL  Final      Radiology Studies last 3 days: DG Ankle 2 Views Left Result Date: 07/06/2024 EXAM: 2 VIEW(S) XRAY OF THE LEFT ANKLE 07/06/2024 01:53:00 PM CLINICAL HISTORY: 144615 Pain 144615 Pain COMPARISON: None available. FINDINGS: BONES AND JOINTS: No acute fracture. Small plantar calcaneal spur. No joint dislocation. SOFT TISSUES: Vascular calcifications in the soft tissues. IMPRESSION: 1. No acute abnormality. Electronically signed by: Elsie Gravely MD 07/06/2024 01:55 PM EST RP Workstation: [id number]           Laneta Blunt, DO [facility] 07/09/2024, 5:38 PM    Dictation software may have been used to generate the above note. Typos may occur and escape review in typed/dictated notes. Please contact Dr Blunt directly for clarity if needed.  Staff may message me via secure chat in Epic  but this may not receive an immediate response,  please page me for urgent matters!  If 7PM-7AM, please contact night coverage [email or web address]

## 2024-07-09 NOTE — TOC Initial Note (Addendum)
 Transition of Care [facility]) - Initial/Assessment Note    Patient Details  Name: Luke Chen MRN: [id number] Date of Birth: [birth year]  [facility]) CM/SW Contact:    Lauraine JAYSON Carpen, LCSW Phone Number: 07/09/2024, 9:25 AM  Clinical Narrative:  Patient only oriented to self. Wife at bedside. CSW introduced role and explained that therapy recommendations would be discussed. Wife is not interested in SNF placement. Discussed home health. She said he has had it through [facility] before. CSW called the office Alston Abbot, FNP's name was on the card that wife had) but they said they have been trying to get him into the office to get services set up at home. Will follow back up with wife regarding setting up home health services in the home.    10:25 am: Santina back to the room to talk to wife about home health. She asked about hospice services. Sent secure chat to palliative NP.      1:27 pm: Patient's wife was under the impression that home health or home hospice would stay with him while she went to run errands. CSW explained that they only come out 2-3 times per week for 45 minutes-1 hour and that anything more than that would be a private pay service. Patient does not have a long-term care insurance policy to her knowledge. Also discussed potential for SNF. CSW gave wife home health and home hospice agency lists for review.        Expected Discharge Plan: Home w Home Health Services Barriers to Discharge: Continued Medical Work up   Patient Goals and CMS Choice            Expected Discharge Plan and Services     Post Acute Care Choice: Home Health Living arrangements for the past 2 months: Single Family Home                                      Prior Living Arrangements/Services Living arrangements for the past 2 months: Single Family Home Lives with:: Spouse Patient language and need for interpreter reviewed:: Yes Do you feel safe going back to  the place where you live?: Yes      Need for Family Participation in Patient Care: Yes (Comment) Care giver support system in place?: Yes (comment)   Criminal Activity/Legal Involvement Pertinent to Current Situation/Hospitalization: No - Comment as needed  Activities of Daily Living   ADL Screening (condition at time of admission) Independently performs ADLs?: Yes (appropriate for developmental age) Is the patient deaf or have difficulty hearing?: No Does the patient have difficulty seeing, even when wearing glasses/contacts?: Yes Does the patient have difficulty concentrating, remembering, or making decisions?: Yes  Permission Sought/Granted Permission sought to share information with : Family Supports    Share Information with NAME: Larwence Tu     Permission granted to share info w Relationship: Wife  Permission granted to share info w Contact Information: [phone]  Emotional Assessment Appearance:: Appears stated age Attitude/Demeanor/Rapport: Engaged, Gracious Affect (typically observed): Accepting, Appropriate, Calm, Pleasant Orientation: : Oriented to Self Alcohol / Substance Use: Not Applicable Psych Involvement: No (comment)  Admission diagnosis:  Weakness [R53.1] Hyperglycemia [R73.9] Elevated troponin [R79.89] Diabetic ketoacidosis without coma associated with other specified diabetes mellitus (HCC) [E13.10] Patient Active Problem List   Diagnosis Date Noted   Hyperglycemia 07/05/2024   Generalized weakness 07/05/2024   IDA (iron deficiency anemia) 07/05/2024  Frailty 07/05/2024   Chronic HFrEF (heart failure with reduced ejection fraction) (HCC) 07/05/2024   Chronic anticoagulation 07/05/2024   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) 07/05/2024   Dementia (HCC)    AMS (altered mental status) 12/18/2023   Impaired ambulation 12/18/2023   AKI (acute kidney injury) 12/18/2023   UTI (urinary tract infection) 12/18/2023   Sepsis (HCC) 12/18/2023   Hematuria  01/02/2022   Hospital discharge follow-up 11/22/2020   Rectal exam 05/31/2020   Diabetes 1.5, 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/2020   Essential hypertension 03/23/2020   Late onset Alzheimer's disease without behavioral disturbance (HCC) 03/23/2020   COVID-19 virus infection 03/23/2020   History of 2019 novel coronavirus disease (COVID-19) 03/23/2020   Elevated troponin 02/05/2019   PCP:  P.A., Sheralyn Flock, MD Pharmacy:   [facility] [address], [place] - [address] [place] [address] Phone: [phone] Fax: [phone]     Social Drivers of Health (SDOH) Social History: SDOH Screenings   Food Insecurity: No Food Insecurity (07/05/2024)  Housing: Low Risk  (07/05/2024)  Transportation Needs: No Transportation Needs (07/05/2024)  Utilities: Not At Risk (07/05/2024)  Alcohol Screen: Low Risk  (08/26/2021)  Depression (PHQ2-9): Low Risk  (08/26/2021)  Financial Resource Strain: Low Risk  (08/26/2021)  Physical Activity: Inactive (08/26/2021)  Social Connections: Moderately Integrated (07/05/2024)  Stress: No Stress Concern Present (08/26/2021)  Tobacco Use: Low Risk  (07/04/2024)   SDOH Interventions:     Readmission Risk Interventions     No data to display

## 2024-07-10 ENCOUNTER — Inpatient Hospital Stay

## 2024-07-10 DIAGNOSIS — R531 Weakness: Secondary | ICD-10-CM | POA: Diagnosis not present

## 2024-07-10 LAB — GLUCOSE, CAPILLARY
Glucose-Capillary: 220 mg/dL — ABNORMAL HIGH (ref 70–99)
Glucose-Capillary: 253 mg/dL — ABNORMAL HIGH (ref 70–99)
Glucose-Capillary: 84 mg/dL (ref 70–99)
Glucose-Capillary: 178 mg/dL — ABNORMAL HIGH (ref 70–99)

## 2024-07-10 MED ORDER — IPRATROPIUM-ALBUTEROL 0.5-2.5 (3) MG/3ML IN SOLN
3.0000 mL | Freq: Once | RESPIRATORY_TRACT | Status: AC
  Administered 2024-07-10: 3 mL via RESPIRATORY_TRACT
  Filled 2024-07-10: qty 3

## 2024-07-10 MED ORDER — HALOPERIDOL LACTATE 5 MG/ML IJ SOLN
1.0000 mg | Freq: Four times a day (QID) | INTRAMUSCULAR | Status: DC | PRN
  Administered 2024-07-10: 2 mg via INTRAMUSCULAR
  Filled 2024-07-10 (×2): qty 1

## 2024-07-10 MED ORDER — INSULIN ASPART 100 UNIT/ML IJ SOLN
0.0000 [IU] | INTRAMUSCULAR | Status: DC
  Administered 2024-07-10 – 2024-07-11 (×3): 3 [IU] via SUBCUTANEOUS
  Administered 2024-07-11 (×2): 5 [IU] via SUBCUTANEOUS
  Administered 2024-07-11 (×3): 3 [IU] via SUBCUTANEOUS
  Filled 2024-07-10 (×2): qty 3
  Filled 2024-07-10: qty 5
  Filled 2024-07-10: qty 2
  Filled 2024-07-10: qty 3

## 2024-07-10 NOTE — Progress Notes (Addendum)
 PROGRESS NOTE    Luke Chen   FMW:[id number] DOB: 12-Mar-1941  DOA: 07/04/2024 Date of Service: 07/10/24 which is hospital day 5  PCP: P.A., Sheralyn Flock, MD    Hospital course / significant events:   Luke Chen is a 83 y.o. 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   HPI: Wife at the bedside who gives a history, states that he had been declining for some time, sleeping for most of the day. States the iron infusions have not been helping him as expected. Due to increased concern she called EMS to bring him in.   11/21: to ED. Started insulin  drip.  11/22: admitted to hospitalist service, hyperglycemia, hypotension, AKI, failure to thrive. Continue abx, cautious hydration.  11/23: hypotensive, adjusting cardiac meds. PT/OT recs for LTC 11/24:  wife has goal for pt to go abck home and not LTC. Given concern that pt is not safe to go home, palliative care consulted 11/25: palliative care consult, unable to reach pt's wife.  11/26: palliative care met w/ patient's wife. On rounds, I also discussed goals / options. Wife is back and forth - TOC following, see A/P. Pt has received 5 days abx, UCx mult species, will give last dose abx today 11/27: unable to reach pts wife. Clinically no change but pt appears comfortable.      Consultants:  Palliative care   Procedures/Surgeries: none      ASSESSMENT & PLAN:   Generalized weakness Possible urinary tract infection Dehydration/hypotension Suspect multifactorial, including dehydration, possible underlying infection, hyperglycemia, with background of anemia and frailty  Troponin elevated but downtrended so low suspicion for ACS Urinalysis shows possible UTI completed ceftriaxone  Fall and aspiration  precautions PT OT consulted and recommending SNF - see below    Hyperglycemia Patient has been weaned off insulin  drip Continue SSI insulin  q4h   Elevated troponin CAD s/p stent to LCx and LAD Suspect demand ischemia.  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 She was under impression home health would include more intensive / frequent home visits, she had not wanted hospice but now is open to it. TOC following and wife considering options.  I do not think pt is safe for home but if Mrs. Beedle wants him home then we will facilitate that but I suspect he will soon need to come back to the ED as she is not able to move him for basic care needs.  I recommended based on goals: if goal  for home then home hospice would be best but may need to look at hospice at facility if she cannot care for him, if goal for maximize time and intensity of care then SNF would be best and would still want palliative/hospice to follow.  I advised Mrs. Cindric meet with hospice one way or  another to discuss options, since if we dc h9im home w/ outpatient palliative this is not something that can start hospice services without more advanced notice (Mrs. Zayas though could call hospice tomorrow if pt dc today and not doing well at home).    No concerns based on BMI: Body mass index is 20.59 kg/m.SABRA Significantly low or high BMI is associated with higher medical risk.  Underweight - under 18  overweight - 25 to 29 obese - 30 or more Class 1 obesity: BMI of 30.0 to 34 Class 2 obesity: BMI of 35.0 to 39 Class 3 obesity: BMI of 40.0 to 49 Super Morbid Obesity: BMI 50-59 Super-super Morbid Obesity: BMI 60+ Healthy nutrition and physical activity advised as adjunct to other disease management and risk reduction treatments    DVT prophylaxis: eliquis  IV fluids: no continuous IV fluids  Nutrition: as toelrated Central lines / other devices: none  Code Status: DNR ACP documentation reviewed: none on file in [facility]  [facility] needs: placement/ home services  Medical barriers to dispo: none.             Subjective / Brief ROS:  Patient not contibutory  Family Communication: called wife 07/10/24 4:10 PM and line rang >1 minute never went to voicemail.     Objective Findings:  Vitals:   07/10/24 0348 07/10/24 0714 07/10/24 1119 07/10/24 1455  BP:  114/71 93/78 110/76  Pulse:  (!) 107 90 95  Resp:  20 16 16   Temp:  (!) 97.5 F (36.4 C) (!) 97.3 F (36.3 C) (!) 97.3 F (36.3 C)  TempSrc:      SpO2: 94%  98% 93%  Weight:      Height:        Intake/Output Summary (Last 24 hours) at 07/10/2024 1610 Last data filed at 07/10/2024 1437 Gross per 24 hour  Intake 0 ml  Output 400 ml  Net -400 ml   Filed Weights   07/04/24 2347 07/05/24 2000 07/06/24 0326  Weight: 54.4 kg 63.9 kg 63.2 kg    Examination:  Physical Exam Constitutional:      General: He is not in acute distress. Cardiovascular:     Rate and Rhythm: Normal rate and regular rhythm.  Pulmonary:      Effort: Pulmonary effort is normal.     Breath sounds: Normal breath sounds.  Abdominal:     Palpations: Abdomen is soft.  Musculoskeletal:     Right lower leg: No edema.     Left lower leg: No edema.  Skin:    General: Skin is warm and dry.     Coloration: Skin is pale.  Neurological:     Mental Status: He is alert. He is disoriented.  Psychiatric:        Behavior: Behavior normal.          Scheduled Medications:   acetaminophen   650 mg Oral TID   apixaban   5 mg Oral BID   atorvastatin   40 mg Oral Daily   donepezil   10 mg Oral Daily   insulin  aspart  0-15 Units Subcutaneous Q4H    Continuous Infusions:    PRN Medications:  acetaminophen  **OR** acetaminophen ,  haloperidol  lactate, ondansetron  **OR** ondansetron  (ZOFRAN ) IV, mouth rinse  Antimicrobials from admission:  Anti-infectives (From admission, onward)    Start     Dose/Rate Route Frequency Ordered Stop   07/05/24 1730  cefTRIAXone  (ROCEPHIN ) 1 g in sodium chloride  0.9 % 100 mL IVPB  Status:  Discontinued        1 g 200 mL/hr over 30 Minutes Intravenous Every 24 hours 07/05/24 1622 07/10/24 0835           Data Reviewed:  I have personally reviewed the following...  CBC: Recent Labs  Lab 07/05/24 0604 07/06/24 0828 07/07/24 0523 07/08/24 0524 07/09/24 0526  WBC 10.5 10.6* 8.7 11.3* 9.4  NEUTROABS  --  7.3 5.4 7.9* 5.7  HGB 10.1* 10.5* 10.3* 10.6* 10.6*  HCT 31.3* 32.6* 31.1* 32.9* 32.2*  MCV 88.7 89.8 88.9 91.6 88.7  PLT 210 198 194 224 204   Basic Metabolic Panel: Recent Labs  Lab 07/05/24 1136 07/06/24 0606 07/07/24 0523 07/08/24 0524 07/09/24 0526  NA 139 141 141 140 140  K 4.3 3.6 3.9 4.4 4.1  CL 106 108 109 109 108  CO2 18* 22 22 18* 19*  GLUCOSE 202* 60* 133* 126* 87  BUN 39* 39* 37* 42* 46*  CREATININE 1.21 1.10 1.09 1.15 1.07  CALCIUM  9.2 8.8* 9.0 9.0 9.0   GFR: Estimated Creatinine Clearance: 48.4 mL/min (by C-G formula based on SCr of 1.07 mg/dL). Liver  Function Tests: No results for input(s): AST, ALT, ALKPHOS, BILITOT, PROT, ALBUMIN in the last 168 hours. No results for input(s): LIPASE, AMYLASE in the last 168 hours. No results for input(s): AMMONIA in the last 168 hours. Coagulation Profile: No results for input(s): INR, PROTIME in the last 168 hours. Cardiac Enzymes: Recent Labs  Lab 07/04/24 2102  CKTOTAL 148   BNP (last 3 results) No results for input(s): PROBNP in the last 8760 hours. HbA1C: No results for input(s): HGBA1C in the last 72 hours. CBG: Recent Labs  Lab 07/09/24 1621 07/09/24 2136 07/09/24 2322 07/10/24 0419 07/10/24 0719  GLUCAP 136* 187* 150* 220* 253*   Lipid Profile: No results for input(s): CHOL, HDL, LDLCALC, TRIG, CHOLHDL, LDLDIRECT in the last 72 hours. Thyroid  Function Tests: No results for input(s): TSH, T4TOTAL, FREET4, T3FREE, THYROIDAB in the last 72 hours. Anemia Panel: No results for input(s): VITAMINB12, FOLATE, FERRITIN, TIBC, IRON, RETICCTPCT in the last 72 hours. Most Recent Urinalysis On File:     Component Value Date/Time   COLORURINE AMBER (A) 07/05/2024 0441   APPEARANCEUR HAZY (A) 07/05/2024 0441   LABSPEC 1.020 07/05/2024 0441   PHURINE 5.0 07/05/2024 0441   GLUCOSEU >=500 (A) 07/05/2024 0441   HGBUR MODERATE (A) 07/05/2024 0441   BILIRUBINUR NEGATIVE 07/05/2024 0441   BILIRUBINUR neg 01/02/2022 1407   KETONESUR NEGATIVE 07/05/2024 0441   PROTEINUR 100 (A) 07/05/2024 0441   UROBILINOGEN negative (A) 01/02/2022 1407   NITRITE NEGATIVE 07/05/2024 0441   LEUKOCYTESUR TRACE (A) 07/05/2024 0441   Sepsis Labs: @LABRCNTIP (procalcitonin:4,lacticidven:4) Microbiology: Recent Results (from the past 240 hours)  MRSA Next Gen by PCR, Nasal     Status: None   Collection Time: 07/05/24  2:00 AM   Specimen: Nasal Mucosa; Nasal Swab  Result Value Ref Range Status   MRSA by PCR Next Gen NOT DETECTED NOT DETECTED Final     Comment: (NOTE) The GeneXpert MRSA Assay (FDA approved for NASAL specimens only), is one component of a comprehensive MRSA colonization surveillance program. It is not intended to diagnose MRSA  infection nor to guide or monitor treatment for MRSA infections. Test performance is not FDA approved in patients less than 60 years old. Performed at [facility], [address], [place], [place] [address]   Urine Culture     Status: Abnormal   Collection Time: 07/05/24  4:41 AM   Specimen: Urine, Random  Result Value Ref Range Status   Specimen Description   Final    URINE, RANDOM Performed at [facility], [address] [place], [place] [address]    Special Requests   Final    NONE Reflexed from [id number] Performed at [facility], [address] [place], [place] [address]    Culture MULTIPLE SPECIES PRESENT, SUGGEST RECOLLECTION (A)  Final   Report Status 07/06/2024 FINAL  Final      Radiology Studies last 3 days: DG Chest 1 View Result Date: 07/10/2024 EXAM: 1 VIEW(S) XRAY OF THE CHEST 07/10/2024 03:54:00 AM COMPARISON: 07/04/2024 CLINICAL HISTORY: Dyspnea FINDINGS: LUNGS AND PLEURA: There are low lung volumes with hazy opacification of both lung bases and obscuration of the hemidiaphragms. No pleural effusion. No pneumothorax. HEART AND MEDIASTINUM: There is calcification within the aortic arch. No acute abnormality of the cardiac and mediastinal silhouettes. BONES AND SOFT TISSUES: No acute osseous abnormality. IMPRESSION: 1. Low lung volumes with hazy opacification of both lung bases and obscuration of the hemidiaphragms, likely secondary to a combination of pleural effusions and atelectasis. Electronically signed by: Evalene Coho MD 07/10/2024 04:04 AM EST RP Workstation: DARYLENE Laneta Blunt, DO [facility] 07/10/2024, 4:10 PM    Dictation software may have been used to generate the above note. Typos may occur  and escape review in typed/dictated notes. Please contact Dr Blunt directly for clarity if needed.  Staff may message me via secure chat in Epic  but this may not receive an immediate response,  please page me for urgent matters!  If 7PM-7AM, please contact night coverage [email or web address]

## 2024-07-10 NOTE — Plan of Care (Signed)
  Problem: Clinical Measurements: Goal: Ability to maintain clinical measurements within normal limits will improve Outcome: Progressing   Problem: Nutrition: Goal: Adequate nutrition will be maintained Outcome: Progressing   Problem: Elimination: Goal: Will not experience complications related to bowel motility Outcome: Progressing   Problem: Skin Integrity: Goal: Risk for impaired skin integrity will decrease Outcome: Progressing   Problem: Safety: Goal: Ability to remain free from injury will improve Outcome: Progressing

## 2024-07-10 NOTE — TOC Progression Note (Signed)
 Transition of Care [facility]) - Progression Note    Patient Details  Name: Luke Chen MRN: [id number] Date of Birth: Sep 28, 1940  [facility]) CM/SW Contact  K'La JINNY Ruts, LCSW Phone Number: 07/10/2024, 2:25 PM  Clinical Narrative:    Chart reviewed. I called the patient spouse to follow up about SNF placement or Hospice care. The patient Spouse phone number was disconnected. Will follow up and try to reach the spouse.    Expected Discharge Plan: Home w Home Health Services Barriers to Discharge: Continued Medical Work up               Expected Discharge Plan and Services     Post Acute Care Choice: Home Health Living arrangements for the past 2 months: Single Family Home Expected Discharge Date: 07/09/24                                     Social Drivers of Health (SDOH) Interventions SDOH Screenings   Food Insecurity: No Food Insecurity (07/05/2024)  Housing: Low Risk  (07/05/2024)  Transportation Needs: No Transportation Needs (07/05/2024)  Utilities: Not At Risk (07/05/2024)  Alcohol Screen: Low Risk  (08/26/2021)  Depression (PHQ2-9): Low Risk  (08/26/2021)  Financial Resource Strain: Low Risk  (08/26/2021)  Physical Activity: Inactive (08/26/2021)  Social Connections: Moderately Integrated (07/05/2024)  Stress: No Stress Concern Present (08/26/2021)  Tobacco Use: Low Risk  (07/04/2024)    Readmission Risk Interventions     No data to display

## 2024-07-10 NOTE — Progress Notes (Addendum)
 Patient is aggressive and hitting on staff. Security called at bedside. DR.  Lawence notified. Haldol  ordered

## 2024-07-10 NOTE — Plan of Care (Addendum)

## 2024-07-11 DIAGNOSIS — F01B18 Vascular dementia, moderate, with other behavioral disturbance: Secondary | ICD-10-CM

## 2024-07-11 DIAGNOSIS — Z515 Encounter for palliative care: Secondary | ICD-10-CM | POA: Diagnosis not present

## 2024-07-11 DIAGNOSIS — F01B0 Vascular dementia, moderate, without behavioral disturbance, psychotic disturbance, mood disturbance, and anxiety: Secondary | ICD-10-CM | POA: Diagnosis not present

## 2024-07-11 DIAGNOSIS — E1310 Other specified diabetes mellitus with ketoacidosis without coma: Secondary | ICD-10-CM | POA: Diagnosis not present

## 2024-07-11 DIAGNOSIS — R531 Weakness: Secondary | ICD-10-CM | POA: Diagnosis not present

## 2024-07-11 LAB — BASIC METABOLIC PANEL WITH GFR
Anion gap: 16 — ABNORMAL HIGH (ref 5–15)
BUN: 40 mg/dL — ABNORMAL HIGH (ref 8–23)
CO2: 18 mmol/L — ABNORMAL LOW (ref 22–32)
Calcium: 9.2 mg/dL (ref 8.9–10.3)
Chloride: 107 mmol/L (ref 98–111)
Creatinine, Ser: 1.22 mg/dL (ref 0.61–1.24)
GFR, Estimated: 60 mL/min — ABNORMAL LOW (ref >60)
Glucose, Bld: 249 mg/dL — ABNORMAL HIGH (ref 70–99)
Potassium: 4.9 mmol/L (ref 3.5–5.1)
Sodium: 141 mmol/L (ref 135–145)

## 2024-07-11 LAB — GLUCOSE, CAPILLARY
Glucose-Capillary: 195 mg/dL — ABNORMAL HIGH (ref 70–99)
Glucose-Capillary: 218 mg/dL — ABNORMAL HIGH (ref 70–99)
Glucose-Capillary: 238 mg/dL — ABNORMAL HIGH (ref 70–99)
Glucose-Capillary: 169 mg/dL — ABNORMAL HIGH (ref 70–99)
Glucose-Capillary: 167 mg/dL — ABNORMAL HIGH (ref 70–99)

## 2024-07-11 LAB — CBC
HCT: 33.5 % — ABNORMAL LOW (ref 39.0–52.0)
Hemoglobin: 10.6 g/dL — ABNORMAL LOW (ref 13.0–17.0)
MCH: 29.3 pg (ref 26.0–34.0)
MCHC: 31.6 g/dL (ref 30.0–36.0)
MCV: 92.5 fL (ref 80.0–100.0)
Platelets: 191 K/uL (ref 150–400)
RBC: 3.62 MIL/uL — ABNORMAL LOW (ref 4.22–5.81)
RDW: 22.8 % — ABNORMAL HIGH (ref 11.5–15.5)
WBC: 7.1 K/uL (ref 4.0–10.5)
nRBC: 0.3 % — ABNORMAL HIGH (ref 0.0–0.2)

## 2024-07-11 MED ORDER — IPRATROPIUM-ALBUTEROL 0.5-2.5 (3) MG/3ML IN SOLN
3.0000 mL | Freq: Four times a day (QID) | RESPIRATORY_TRACT | Status: DC | PRN
  Administered 2024-07-11 – 2024-07-12 (×2): 3 mL via RESPIRATORY_TRACT
  Filled 2024-07-11 (×2): qty 3

## 2024-07-11 MED ORDER — ENSURE PLUS HIGH PROTEIN PO LIQD
237.0000 mL | Freq: Two times a day (BID) | ORAL | Status: DC
  Administered 2024-07-11 – 2024-07-17 (×8): 237 mL via ORAL

## 2024-07-11 NOTE — TOC Progression Note (Signed)
 Transition of Care [facility]) - Progression Note    Patient Details  Name: Luke Chen MRN: [id number] Date of Birth: 1941/05/26  Transition of Care [facility]) CM/SW Contact  Daved JONETTA Hamilton, RN Phone Number: 07/11/2024, 7:26 PM  Clinical Narrative:     Entered patients room, patients spouse [name] and son Lorrene at patient's bedside, Dr. Marsa present at bedside discussing patients current status and recommendations for discharge. [name] verbalized agreement for patient to go to SNF[facility], no preferences other than would like facility to be close to home.   PASRR obtained [id number]  FL2 sent for signature Facility search initiated in HUB  Present offer(s) to patient spouse [name] when available. [name] previously presented with Medicare.gov list of SNF facilities which her son had at bedside.   Expected Discharge Plan: Home w Home Health Services Barriers to Discharge: Continued Medical Work up               Expected Discharge Plan and Services     Post Acute Care Choice: Home Health Living arrangements for the past 2 months: Single Family Home Expected Discharge Date: 07/09/24                                     Social Drivers of Health (SDOH) Interventions SDOH Screenings   Food Insecurity: No Food Insecurity (07/05/2024)  Housing: Low Risk  (07/05/2024)  Transportation Needs: No Transportation Needs (07/05/2024)  Utilities: Not At Risk (07/05/2024)  Alcohol Screen: Low Risk  (08/26/2021)  Depression (PHQ2-9): Low Risk  (08/26/2021)  Financial Resource Strain: Low Risk  (08/26/2021)  Physical Activity: Inactive (08/26/2021)  Social Connections: Moderately Integrated (07/05/2024)  Stress: No Stress Concern Present (08/26/2021)  Tobacco Use: Low Risk  (07/04/2024)    Readmission Risk Interventions     No data to display

## 2024-07-11 NOTE — Progress Notes (Signed)
 PROGRESS NOTE    Luke Chen   FMW:[id number] DOB: [birth year]  DOA: 07/04/2024 Date of Service: 07/11/24 which is hospital day 6  PCP: P.A., Sheralyn Flock, MD    Hospital course / significant events:   Luke Chen is a 83 y.o. 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   HPI: Wife at the bedside who gives a history, states that he had been declining for some time, sleeping for most of the day. States the iron infusions have not been helping him as expected. Due to increased concern she called EMS to bring him in.   11/21: to ED. Started insulin  drip.  11/22: admitted to hospitalist service, hyperglycemia, hypotension, AKI, failure to thrive. Continue abx, cautious hydration.  11/23: hypotensive, adjusting cardiac meds. PT/OT recs for LTC 11/24:  wife has goal for pt to go abck home and not LTC. Palliative care consulted 11/25: palliative care unable to reach pt's wife.  11/26: palliative care met w/ patient's wife. On rounds, I also discussed goals / options. Wife is back and forth - TOC following, see A/P. Pt has received 5 days abx, UCx mult species, will give last dose abx today 11/27: unable to reach pts wife. Clinically no change but pt appears comfortable.  11/28: d/w wife at bedside, dispo still in process, see below, pt not improving. PT/OT to try to see this afternoon while wife is present.      Consultants:  Palliative care   Procedures/Surgeries: none      ASSESSMENT & PLAN:   Generalized weakness Possible urinary tract infection Dehydration/hypotension Suspect multifactorial, including dehydration, possible underlying infection, hyperglycemia, with background of anemia and frailty  Troponin elevated but downtrended so low suspicion for  ACS Urinalysis shows possible UTI completed ceftriaxone  Fall and aspiration precautions PT OT consulted and recommending SNF - see below    Dementia with behavioral disturbance I have discussed w/ [name] that he is fitting the clinical diagnosis of worsening vascular dementia. I affirm that an abrupt change can be very difficult to witness and to manage. [name] believes that sedating medication is cause for his problems now. I have affirmed that sedating medication can certainly alter a patient, but I have been firm that I will support use of these medications if there is a need to use them for his safety or for the staff's safety. We will try to cut back on meds but I have been clear that I do not think such medications are the main factor in his decline, but rather the dementia is the problem  Delirium precautions Continue Aricept  Fall and aspiration precautions One-to-one precaution  Haldol  prn - please limit use to absolute necessity for safety   Hyperglycemia Patient has been weaned off insulin  drip Continue SSI insulin  q4h    Elevated troponin CAD s/p stent to LCx and LAD Suspect demand ischemia.  Troponin 143-> 128 Recheck as needed   AKI (acute kidney injury)-resolved Creatinine 1.36 up from baseline of 0.9 on 10/7 Renal function back to baseline Monitor renal function and avoid nephrotoxins   Chronic HFrEF (heart failure with reduced ejection fraction) not in exacerbation Currently euvolemic Holding GDMT meds due to soft blood pressure Monitor input and output as well as daily weights   Chronic anticoagulation History of LV thrombus Continue  apixaban    IDA (iron deficiency anemia) On iron transfusion, last transfusion 11/17 Monitor CBC Hemoglobin fairly stable   Essential hypertension Holding antihypertensives due to soft blood pressure   Advanced care planning Pt's wife initially didn't want SNF but now concerned she will not be able to care for patient  adequately (she was under impression home health would include more intensive / frequent home visits), she had not wanted hospice but now is open to it. TOC following  SNF/LTC is safest option for the patient. I have been clear w/ [name] that this is my recommendations.  I have also been clear that we will support her if she chooses to take him home. If home, would have hospice follow along but they cannot offer much in the way of day-to-day help.  D/w palliative team, they will follow up tomorrow     No concerns based on BMI: Body mass index is 20.59 kg/m.SABRA Significantly low or high BMI is associated with higher medical risk.  Underweight - under 18  overweight - 25 to 29 obese - 30 or more Class 1 obesity: BMI of 30.0 to 34 Class 2 obesity: BMI of 35.0 to 39 Class 3 obesity: BMI of 40.0 to 49 Super Morbid Obesity: BMI 50-59 Super-super Morbid Obesity: BMI 60+ Healthy nutrition and physical activity advised as adjunct to other disease management and risk reduction treatments    DVT prophylaxis: eliquis  IV fluids: no continuous IV fluids  Nutrition: as tolerated Central lines / other devices: none  Code Status: DNR ACP documentation reviewed: none on file in [facility]  [facility] needs: placement/ home services  Medical barriers to dispo: none.             Subjective / Brief ROS:  Patient not contibutory  Family Communication: wife at bedside today on rounds see above     Objective Findings:  Vitals:   07/11/24 0200 07/11/24 0508 07/11/24 0708 07/11/24 1213  BP: 107/77 114/69 110/66 114/87  Pulse: 97 77 95 98  Resp: 18 18  14   Temp:    98.6 F (37 C)  TempSrc:    Oral  SpO2: 91% 91% 91% 99%  Weight:      Height:        Intake/Output Summary (Last 24 hours) at 07/11/2024 1338 Last data filed at 07/11/2024 0930 Gross per 24 hour  Intake 340 ml  Output 750 ml  Net -410 ml   Filed Weights   07/04/24 2347 07/05/24 2000 07/06/24 0326  Weight: 54.4 kg 63.9  kg 63.2 kg    Examination:  Physical Exam Constitutional:      General: He is not in acute distress. Cardiovascular:     Rate and Rhythm: Normal rate and regular rhythm.  Pulmonary:     Effort: Pulmonary effort is normal.     Breath sounds: Normal breath sounds.  Abdominal:     Palpations: Abdomen is soft.  Musculoskeletal:     Right lower leg: No edema.     Left lower leg: No edema.  Skin:    General: Skin is warm and dry.     Coloration: Skin is pale.  Neurological:     Mental Status: He is alert. He is disoriented.  Psychiatric:        Behavior: Behavior normal.          Scheduled Medications:   acetaminophen   650 mg Oral TID   apixaban   5 mg Oral BID   atorvastatin   40 mg Oral  Daily   donepezil   10 mg Oral Daily   feeding supplement  237 mL Oral BID BM   insulin  aspart  0-15 Units Subcutaneous Q4H    Continuous Infusions:    PRN Medications:  acetaminophen  **OR** acetaminophen , haloperidol  lactate, ipratropium-albuterol , ondansetron  **OR** ondansetron  (ZOFRAN ) IV, mouth rinse  Antimicrobials from admission:  Anti-infectives (From admission, onward)    Start     Dose/Rate Route Frequency Ordered Stop   07/05/24 1730  cefTRIAXone  (ROCEPHIN ) 1 g in sodium chloride  0.9 % 100 mL IVPB  Status:  Discontinued        1 g 200 mL/hr over 30 Minutes Intravenous Every 24 hours 07/05/24 1622 07/10/24 0835           Data Reviewed:  I have personally reviewed the following...  CBC: Recent Labs  Lab 07/06/24 0828 07/07/24 0523 07/08/24 0524 07/09/24 0526 07/11/24 1010  WBC 10.6* 8.7 11.3* 9.4 7.1  NEUTROABS 7.3 5.4 7.9* 5.7  --   HGB 10.5* 10.3* 10.6* 10.6* 10.6*  HCT 32.6* 31.1* 32.9* 32.2* 33.5*  MCV 89.8 88.9 91.6 88.7 92.5  PLT 198 194 224 204 191   Basic Metabolic Panel: Recent Labs  Lab 07/06/24 0606 07/07/24 0523 07/08/24 0524 07/09/24 0526 07/11/24 1010  NA 141 141 140 140 141  K 3.6 3.9 4.4 4.1 4.9  CL 108 109 109 108 107  CO2 22  22 18* 19* 18*  GLUCOSE 60* 133* 126* 87 249*  BUN 39* 37* 42* 46* 40*  CREATININE 1.10 1.09 1.15 1.07 1.22  CALCIUM  8.8* 9.0 9.0 9.0 9.2   GFR: Estimated Creatinine Clearance: 42.4 mL/min (by C-G formula based on SCr of 1.22 mg/dL). Liver Function Tests: No results for input(s): AST, ALT, ALKPHOS, BILITOT, PROT, ALBUMIN in the last 168 hours. No results for input(s): LIPASE, AMYLASE in the last 168 hours. No results for input(s): AMMONIA in the last 168 hours. Coagulation Profile: No results for input(s): INR, PROTIME in the last 168 hours. Cardiac Enzymes: Recent Labs  Lab 07/04/24 2102  CKTOTAL 148   BNP (last 3 results) No results for input(s): PROBNP in the last 8760 hours. HbA1C: No results for input(s): HGBA1C in the last 72 hours. CBG: Recent Labs  Lab 07/10/24 0719 07/10/24 1621 07/10/24 1953 07/11/24 0513 07/11/24 0814  GLUCAP 253* 84 178* 195* 218*   Lipid Profile: No results for input(s): CHOL, HDL, LDLCALC, TRIG, CHOLHDL, LDLDIRECT in the last 72 hours. Thyroid  Function Tests: No results for input(s): TSH, T4TOTAL, FREET4, T3FREE, THYROIDAB in the last 72 hours. Anemia Panel: No results for input(s): VITAMINB12, FOLATE, FERRITIN, TIBC, IRON, RETICCTPCT in the last 72 hours. Most Recent Urinalysis On File:     Component Value Date/Time   COLORURINE AMBER (A) 07/05/2024 0441   APPEARANCEUR HAZY (A) 07/05/2024 0441   LABSPEC 1.020 07/05/2024 0441   PHURINE 5.0 07/05/2024 0441   GLUCOSEU >=500 (A) 07/05/2024 0441   HGBUR MODERATE (A) 07/05/2024 0441   BILIRUBINUR NEGATIVE 07/05/2024 0441   BILIRUBINUR neg 01/02/2022 1407   KETONESUR NEGATIVE 07/05/2024 0441   PROTEINUR 100 (A) 07/05/2024 0441   UROBILINOGEN negative (A) 01/02/2022 1407   NITRITE NEGATIVE 07/05/2024 0441   LEUKOCYTESUR TRACE (A) 07/05/2024 0441   Sepsis Labs: @LABRCNTIP (procalcitonin:4,lacticidven:4) Microbiology: Recent  Results (from the past 240 hours)  MRSA Next Gen by PCR, Nasal     Status: None   Collection Time: 07/05/24  2:00 AM   Specimen: Nasal Mucosa; Nasal Swab  Result Value Ref Range Status  MRSA by PCR Next Gen NOT DETECTED NOT DETECTED Final    Comment: (NOTE) The GeneXpert MRSA Assay (FDA approved for NASAL specimens only), is one component of a comprehensive MRSA colonization surveillance program. It is not intended to diagnose MRSA infection nor to guide or monitor treatment for MRSA infections. Test performance is not FDA approved in patients less than 50 years old. Performed at [facility], [address], [place], [place] [address]   Urine Culture     Status: Abnormal   Collection Time: 07/05/24  4:41 AM   Specimen: Urine, Random  Result Value Ref Range Status   Specimen Description   Final    URINE, RANDOM Performed at [facility], [address] [place], [place] [address]    Special Requests   Final    NONE Reflexed from [phone] [id number] Performed at [facility], [address] [place], [place] [address]    Culture MULTIPLE SPECIES PRESENT, SUGGEST RECOLLECTION (A)  Final   Report Status 07/06/2024 FINAL  Final      Radiology Studies last 3 days: DG Chest 1 View Result Date: 07/10/2024 EXAM: 1 VIEW(S) XRAY OF THE CHEST 07/10/2024 03:54:00 AM COMPARISON: 07/04/2024 CLINICAL HISTORY: Dyspnea FINDINGS: LUNGS AND PLEURA: There are low lung volumes with hazy opacification of both lung bases and obscuration of the hemidiaphragms. No pleural effusion. No pneumothorax. HEART AND MEDIASTINUM: There is calcification within the aortic arch. No acute abnormality of the cardiac and mediastinal silhouettes. BONES AND SOFT TISSUES: No acute osseous abnormality. IMPRESSION: 1. Low lung volumes with hazy opacification of both lung bases and obscuration of the hemidiaphragms, likely secondary to a combination of pleural effusions and atelectasis. Electronically signed  by: Evalene Coho MD 07/10/2024 04:04 AM EST RP Workstation: DARYLENE Laneta Blunt, DO [facility] 07/11/2024, 1:38 PM    Dictation software may have been used to generate the above note. Typos may occur and escape review in typed/dictated notes. Please contact Dr Blunt directly for clarity if needed.  Staff may message me via secure chat in Epic  but this may not receive an immediate response,  please page me for urgent matters!  If 7PM-7AM, please contact night coverage [email or web address]

## 2024-07-11 NOTE — Plan of Care (Signed)

## 2024-07-11 NOTE — Plan of Care (Signed)
  Problem: Clinical Measurements: Goal: Ability to maintain clinical measurements within normal limits will improve Outcome: Progressing   Problem: Safety: Goal: Ability to remain free from injury will improve Outcome: Progressing   

## 2024-07-11 NOTE — Progress Notes (Signed)
 Physical Therapy Treatment Patient Details Name: Luke Chen MRN: [id number] DOB: 09-Mar-1941 Today's Date: 07/11/2024   History of Present Illness Luke Chen is an 83yoM who [facility] on 11/[facility] via [facility], wife called EMS after pt felt weak and lie down on the floor. BG in 400s. 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    PT Comments  Author and MD arrived to room with pt/pt's spouse + pt's son present. Pt was in mitts and has recruitment consultant in place. Pt is alert but completely disoriented and unaware of situation,location, or time. He inconsistently follows commands and needed physical assistance to initiate all desired task requested of him. Pt did sit EOB (R side) with mod assist just due to lack of initiation. Once seated EOB. Pt stood a few times with HHA +1 and max vcs and encouragement. Pt's cognition deficits greatly impact pt's abilities. Pt will require 24/7 assistance and supervision, for safety, going forward. Acute PT will continue to follow per current POC.    If plan is discharge home, recommend the following: A lot of help with walking and/or transfers;A lot of help with bathing/dressing/bathroom;Assistance with cooking/housework;Direct supervision/assist for medications management;Direct supervision/assist for financial management;Assist for transportation;Help with stairs or ramp for entrance;Supervision due to cognitive status     Equipment Recommendations  None recommended by PT       Precautions / Restrictions Precautions Precautions: Fall Recall of Precautions/Restrictions: Impaired Restrictions Weight Bearing Restrictions Per Provider Order: No     Mobility  Bed Mobility Overal bed mobility: Needs Assistance Bed Mobility: Supine to Sit Rolling: Max assist Supine to sit: Max assist Sit to supine: Max assist     Transfers Overall transfer level: Needs assistance Equipment used: 1 person hand held assist Transfers: Sit  to/from Stand Sit to Stand: Max assist  General transfer comment: Max assist to stand EOB with HHA +1    Ambulation/Gait  General Gait Details: Elected not to ambulate away from EOB due to pt's inability to follow commands and having had BM while standing EOB. pt completely unaware he had BM. RN staff was made aware.     Balance Overall balance assessment: Needs assistance Sitting-balance support: Feet supported Sitting balance-Leahy Scale: Fair     Standing balance support: Single extremity supported, During functional activity Standing balance-Leahy Scale: Poor Standing balance comment: High fall risk       Communication Communication Communication: Impaired Factors Affecting Communication: Difficulty expressing self;Hearing impaired  Cognition Arousal: Alert, Lethargic Behavior During Therapy: Flat affect   PT - Cognitive impairments: History of cognitive impairments    PT - Cognition Comments: Pt is awake but does not follow any commands or have any awareness of situation. hx of dementia Following commands: Impaired Following commands impaired: Follows one step commands inconsistently    Cueing Cueing Techniques: Verbal cues, Tactile cues, Visual cues         Pertinent Vitals/Pain Pain Assessment Pain Assessment: No/denies pain Breathing: normal Negative Vocalization: none Facial Expression: smiling or inexpressive Body Language: relaxed Consolability: no need to console PAINAD Score: 0     PT Goals (current goals can now be found in the care plan section) Acute Rehab PT Goals Patient Stated Goal: pt's family wants pt to be able to DC home. Progress towards PT goals: Progressing toward goals    Frequency    Min 1X/week       Co-evaluation     PT goals addressed during session: Mobility/safety  with mobility;Balance;Proper use of DME;Strengthening/ROM        AM-PAC PT 6 Clicks Mobility   Outcome Measure  Help needed turning from your back  to your side while in a flat bed without using bedrails?: A Lot Help needed moving from lying on your back to sitting on the side of a flat bed without using bedrails?: A Lot Help needed moving to and from a bed to a chair (including a wheelchair)?: A Lot Help needed standing up from a chair using your arms (e.g., wheelchair or bedside chair)?: A Lot Help needed to walk in hospital room?: A Lot Help needed climbing 3-5 steps with a railing? : Total 6 Click Score: 11    End of Session Equipment Utilized During Treatment: Oxygen  Activity Tolerance: Patient limited by lethargy Patient left: in bed;with call bell/phone within reach;with family/visitor present;with nursing/sitter in room (MD in room with family discussing POC.) Nurse Communication: Mobility status PT Visit Diagnosis: Other abnormalities of gait and mobility (R26.89);Muscle weakness (generalized) (M62.81)     Time: 8390-8375 PT Time Calculation (min) (ACUTE ONLY): 15 min  Charges:    $Therapeutic Activity: 8-22 mins PT General Charges $$ ACUTE PT VISIT: 1 Visit                     Rankin Essex PTA 07/11/24, 5:29 PM

## 2024-07-11 NOTE — IPAL (Signed)
  Interdisciplinary Goals of Care Family Meeting   Date carried out: 07/11/2024  Location of the meeting: Bedside  Member's involved: Physician, Social Worker, Family Member or next of kin, and Other: physical therapist.  Durable Power of Attorney or acting medical decision maker: Luke Chen. His son is also present     Discussion: We discussed goals of care for Luke Chen .  I discussed basic pathophys of DKA, AKI, vascular dementia and its progression in the setting of other organic insults, also risk w/ previous CVA. I was clear that Luke prognosis for improvement is very limited or he may not improve at all. Luke Chen is amenable at this point to SNF rehab with goal to go home eventually. I advised this may not be possible (home). We will work on SNF placement but if pt continues to have behavioral symptoms then this might preclude placement, at which time we may need to consider long term care. Luke Chen and son affirm that pt would not want CPR/intubation, and DNR remains in place, otherwise treat all we can.   Code status:   Code Status: Limited: Do not attempt resuscitation (DNR) -DNR-LIMITED -Do Not Intubate/DNI    Disposition: Continue current acute care  Time spent for the meeting: 35 min     Laneta Blunt, DO  07/11/2024, 4:43 PM

## 2024-07-11 NOTE — NC FL2 (Signed)
 [place]  MEDICAID FL2 LEVEL OF CARE FORM     IDENTIFICATION  Patient Name: Luke Chen Birthdate: June 21, 1941 Sex: male Admission Date (Current Location): 07/04/2024  [facility] and [place] Number:  Chiropodist and Address:  [facility], [address], [place], [place] [address]      Provider Number: [id number]  Attending Physician Name and Address:  Marsa Edelman, DO  Relative Name and Phone Number:  Johnn Krasowski [phone]    Current Level of Care: Hospital Recommended Level of Care: Skilled Nursing Facility Prior Approval Number:    Date Approved/Denied:   PASRR Number: [id number]  Discharge Plan: SNF    Current Diagnoses: Patient Active Problem List   Diagnosis Date Noted   Hyperglycemia 07/05/2024   Generalized weakness 07/05/2024   IDA (iron deficiency anemia) 07/05/2024   Frailty 07/05/2024   Chronic HFrEF (heart failure with reduced ejection fraction) (HCC) 07/05/2024   Chronic anticoagulation 07/05/2024   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) 07/05/2024   Dementia (HCC)    AMS (altered mental status) 12/18/2023   Impaired ambulation 12/18/2023   AKI (acute kidney injury) 12/18/2023   UTI (urinary tract infection) 12/18/2023   Sepsis (HCC) 12/18/2023   Hematuria 01/02/2022   Hospital discharge follow-up 11/22/2020   Rectal exam 05/31/2020   Diabetes 1.5, 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/2020   Essential hypertension 03/23/2020   Late onset Alzheimer's disease without behavioral disturbance (HCC) 03/23/2020   COVID-19 virus infection 03/23/2020   History of 2019 novel coronavirus disease (COVID-19) 03/23/2020   Elevated troponin 02/05/2019    Orientation RESPIRATION BLADDER Height & Weight        Normal Incontinent Weight: 63.2 kg Height:  5' 9 (175.3 cm)  BEHAVIORAL SYMPTOMS/MOOD NEUROLOGICAL BOWEL NUTRITION STATUS      Incontinent Diet (carb modified)  AMBULATORY STATUS COMMUNICATION OF NEEDS Skin    Extensive Assist Non-Verbally (patient verbal at times) Other (Comment) (traumatic wounds to LT posterior elbow- foam dresssing, LT posterior arm-foam dresssing, RT posterior elbow-foam dresssing)                       Personal Care Assistance Level of Assistance  Bathing, Feeding, Dressing Bathing Assistance: Maximum assistance Feeding assistance: Maximum assistance Dressing Assistance: Maximum assistance     Functional Limitations Info  Sight Sight Info: Impaired        SPECIAL CARE FACTORS FREQUENCY  PT (By licensed PT), OT (By licensed OT)     PT Frequency: 5x week OT Frequency: 5x week            Contractures Contractures Info: Not present    Additional Factors Info  Code Status, Allergies Code Status Info: DNR Allergies Info: Gramineae Pollens           Current Medications (07/11/2024):  This is the current hospital active medication list Current Facility-Administered Medications  Medication Dose Route Frequency Provider Last Rate Last Admin   acetaminophen  (TYLENOL ) tablet 650 mg  650 mg Oral Q6H PRN Duncan, Hazel V, MD   650 mg at 07/09/24 0654   Or   acetaminophen  (TYLENOL ) suppository 650 mg  650 mg Rectal Q6H PRN Cleatus Delayne GAILS, MD       acetaminophen  (TYLENOL ) tablet 650 mg  650 mg Oral TID Mahan, Kasie J, NP   650 mg at 07/11/24 1743   apixaban  (ELIQUIS ) tablet 5 mg  5 mg Oral BID Duncan, Hazel V, MD   5 mg at 07/11/24 0900   atorvastatin  (  LIPITOR) tablet 40 mg  40 mg Oral Daily Duncan, Hazel V, MD   40 mg at 07/11/24 0900   donepezil  (ARICEPT ) tablet 10 mg  10 mg Oral Daily Duncan, Hazel V, MD   10 mg at 07/11/24 0902   feeding supplement (ENSURE PLUS HIGH PROTEIN) liquid 237 mL  237 mL Oral BID BM Alexander, Natalie, DO   237 mL at 07/11/24 1400   haloperidol  lactate (HALDOL ) injection 1-2 mg  1-2 mg Intramuscular Q6H PRN Mansy, Jan A, MD   2 mg at 07/10/24 9947   insulin  aspart (novoLOG ) injection 0-15 Units  0-15 Units Subcutaneous Q4H  Alexander, Natalie, DO   3 Units at 07/11/24 1748   ipratropium-albuterol  (DUONEB) 0.5-2.5 (3) MG/3ML nebulizer solution 3 mL  3 mL Nebulization Q6H PRN Mansy, Jan A, MD   3 mL at 07/11/24 9762   ondansetron  (ZOFRAN ) tablet 4 mg  4 mg Oral Q6H PRN Duncan, Hazel V, MD   4 mg at 07/10/24 9085   Or   ondansetron  (ZOFRAN ) injection 4 mg  4 mg Intravenous Q6H PRN Duncan, Hazel V, MD       Oral care mouth rinse  15 mL Mouth Rinse PRN Dorinda Drue DASEN, MD         Discharge Medications: Please see discharge summary for a list of discharge medications.  Relevant Imaging Results:  Relevant Lab Results:   Additional Information SS# [id number]  Daved JONETTA Hamilton, RN

## 2024-07-11 NOTE — Progress Notes (Signed)
 PT Cancellation Note  Patient Details Name: Jameis Newsham MRN: [id number] DOB: 01-30-1941   Cancelled Treatment:     PT attempt. Spouse not at bedside and needs to be present for session so she is fully aware of pt's abilities. Per sitter, spouse said she would be back at 4pm. Chartered Loss Adjuster will return at 4pm and continue to follow + progress as able.    Rankin KATHEE Essex 07/11/2024, 3:35 PM

## 2024-07-12 ENCOUNTER — Inpatient Hospital Stay

## 2024-07-12 DIAGNOSIS — Z9889 Other specified postprocedural states: Secondary | ICD-10-CM

## 2024-07-12 DIAGNOSIS — G301 Alzheimer's disease with late onset: Secondary | ICD-10-CM

## 2024-07-12 DIAGNOSIS — N179 Acute kidney failure, unspecified: Secondary | ICD-10-CM

## 2024-07-12 DIAGNOSIS — R531 Weakness: Secondary | ICD-10-CM | POA: Diagnosis not present

## 2024-07-12 DIAGNOSIS — J90 Pleural effusion, not elsewhere classified: Secondary | ICD-10-CM

## 2024-07-12 DIAGNOSIS — F0280 Dementia in other diseases classified elsewhere without behavioral disturbance: Secondary | ICD-10-CM

## 2024-07-12 DIAGNOSIS — Z7189 Other specified counseling: Secondary | ICD-10-CM

## 2024-07-12 DIAGNOSIS — Z711 Person with feared health complaint in whom no diagnosis is made: Secondary | ICD-10-CM | POA: Diagnosis not present

## 2024-07-12 DIAGNOSIS — Z515 Encounter for palliative care: Secondary | ICD-10-CM | POA: Diagnosis not present

## 2024-07-12 LAB — COMPREHENSIVE METABOLIC PANEL WITH GFR
ALT: 58 U/L — ABNORMAL HIGH (ref 0–44)
AST: 47 U/L — ABNORMAL HIGH (ref 15–41)
Albumin: 3.7 g/dL (ref 3.5–5.0)
Alkaline Phosphatase: 195 U/L — ABNORMAL HIGH (ref 38–126)
Anion gap: 13 (ref 5–15)
BUN: 39 mg/dL — ABNORMAL HIGH (ref 8–23)
CO2: 23 mmol/L (ref 22–32)
Calcium: 9.4 mg/dL (ref 8.9–10.3)
Chloride: 107 mmol/L (ref 98–111)
Creatinine, Ser: 1.16 mg/dL (ref 0.61–1.24)
GFR, Estimated: >60 mL/min (ref >60)
Glucose, Bld: 193 mg/dL — ABNORMAL HIGH (ref 70–99)
Potassium: 4.9 mmol/L (ref 3.5–5.1)
Sodium: 142 mmol/L (ref 135–145)
Total Bilirubin: 0.6 mg/dL (ref 0.0–1.2)
Total Protein: 6.4 g/dL — ABNORMAL LOW (ref 6.5–8.1)

## 2024-07-12 LAB — GLUCOSE, CAPILLARY
Glucose-Capillary: 135 mg/dL — ABNORMAL HIGH (ref 70–99)
Glucose-Capillary: 154 mg/dL — ABNORMAL HIGH (ref 70–99)
Glucose-Capillary: 180 mg/dL — ABNORMAL HIGH (ref 70–99)
Glucose-Capillary: 211 mg/dL — ABNORMAL HIGH (ref 70–99)
Glucose-Capillary: 226 mg/dL — ABNORMAL HIGH (ref 70–99)
Glucose-Capillary: 245 mg/dL — ABNORMAL HIGH (ref 70–99)
Glucose-Capillary: 46 mg/dL — ABNORMAL LOW (ref 70–99)
Glucose-Capillary: 62 mg/dL — ABNORMAL LOW (ref 70–99)
Glucose-Capillary: 72 mg/dL (ref 70–99)
Glucose-Capillary: 82 mg/dL (ref 70–99)

## 2024-07-12 LAB — BODY FLUID CELL COUNT WITH DIFFERENTIAL
Eos, Fluid: 0 %
Lymphs, Fluid: 72 %
Monocyte-Macrophage-Serous Fluid: 22 %
Neutrophil Count, Fluid: 6 %
Total Nucleated Cell Count, Fluid: 315 uL

## 2024-07-12 LAB — CBC WITH DIFFERENTIAL/PLATELET
Abs Immature Granulocytes: 0.03 K/uL (ref 0.00–0.07)
Basophils Absolute: 0 K/uL (ref 0.0–0.1)
Basophils Relative: 0 %
Eosinophils Absolute: 0.2 K/uL (ref 0.0–0.5)
Eosinophils Relative: 2 %
HCT: 37.2 % — ABNORMAL LOW (ref 39.0–52.0)
Hemoglobin: 11.9 g/dL — ABNORMAL LOW (ref 13.0–17.0)
Immature Granulocytes: 0 %
Lymphocytes Relative: 22 %
Lymphs Abs: 1.9 K/uL (ref 0.7–4.0)
MCH: 29.8 pg (ref 26.0–34.0)
MCHC: 32 g/dL (ref 30.0–36.0)
MCV: 93.2 fL (ref 80.0–100.0)
Monocytes Absolute: 0.8 K/uL (ref 0.1–1.0)
Monocytes Relative: 10 %
Neutro Abs: 5.5 K/uL (ref 1.7–7.7)
Neutrophils Relative %: 66 %
Platelets: 185 K/uL (ref 150–400)
RBC: 3.99 MIL/uL — ABNORMAL LOW (ref 4.22–5.81)
RDW: 23.8 % — ABNORMAL HIGH (ref 11.5–15.5)
Smear Review: NORMAL
WBC: 8.4 K/uL (ref 4.0–10.5)
nRBC: 0 % (ref 0.0–0.2)

## 2024-07-12 LAB — GLUCOSE, PLEURAL OR PERITONEAL FLUID: Glucose, Fluid: 181 mg/dL

## 2024-07-12 LAB — LACTATE DEHYDROGENASE, PLEURAL OR PERITONEAL FLUID: LD, Fluid: 92 U/L — ABNORMAL HIGH (ref 3–23)

## 2024-07-12 LAB — PROTEIN, PLEURAL OR PERITONEAL FLUID: Total protein, fluid: <3 g/dL

## 2024-07-12 MED ORDER — GLUCOSE 40 % PO GEL: 1.0000 | Freq: Once | ORAL | Status: DC | PRN

## 2024-07-12 MED ORDER — MORPHINE SULFATE (PF) 2 MG/ML IV SOLN: 2.0000 mg | Freq: Once | INTRAVENOUS | Status: DC

## 2024-07-12 MED ORDER — HALOPERIDOL LACTATE 5 MG/ML IJ SOLN: 1.0000 mg | Freq: Four times a day (QID) | INTRAMUSCULAR | Status: DC | PRN

## 2024-07-12 MED ORDER — GLUCAGON HCL RDNA (DIAGNOSTIC) 1 MG IJ SOLR: 1.0000 mg | Freq: Once | INTRAMUSCULAR | Status: DC | PRN

## 2024-07-12 MED ORDER — DEXTROSE 50 % IV SOLN
INTRAVENOUS | Status: AC
  Administered 2024-07-12: 50 mL
  Filled 2024-07-12: qty 50

## 2024-07-12 MED ORDER — FUROSEMIDE 10 MG/ML IJ SOLN
20.0000 mg | Freq: Once | INTRAMUSCULAR | Status: AC
  Administered 2024-07-12: 20 mg via INTRAVENOUS
  Filled 2024-07-12: qty 2

## 2024-07-12 MED ORDER — DEXTROSE 50 % IV SOLN
1.0000 | Freq: Once | INTRAVENOUS | Status: DC
  Filled 2024-07-12: qty 50

## 2024-07-12 MED ORDER — LIDOCAINE HCL (PF) 1 % IJ SOLN
10.0000 mL | Freq: Once | INTRAMUSCULAR | Status: AC
  Administered 2024-07-12: 10 mL via INTRADERMAL

## 2024-07-12 MED ORDER — IOHEXOL 300 MG/ML  SOLN
80.0000 mL | Freq: Once | INTRAMUSCULAR | Status: AC | PRN
  Administered 2024-07-12: 80 mL via INTRAVENOUS

## 2024-07-12 MED ORDER — FUROSEMIDE 10 MG/ML IJ SOLN
40.0000 mg | Freq: Three times a day (TID) | INTRAMUSCULAR | Status: AC
  Administered 2024-07-12 – 2024-07-13 (×3): 40 mg via INTRAVENOUS
  Filled 2024-07-12 (×3): qty 4

## 2024-07-12 MED ORDER — DEXTROSE 50 % IV SOLN
25.0000 g | INTRAVENOUS | Status: AC
  Administered 2024-07-12: 25 g via INTRAVENOUS

## 2024-07-12 NOTE — Progress Notes (Signed)
 The patient's CBG was 46 at 0000. The patient was to lethargic to take orange juice by mouth. An RN started an IV to the patient's L forearm 22 gauge with attempt x 2. IV Dextrose  25g/51ml was given per hypoglycemic protocol. Dr Madison Peaches notified and gave order for PRN Glucose Gel.

## 2024-07-12 NOTE — Plan of Care (Signed)

## 2024-07-12 NOTE — Progress Notes (Signed)
 Patient apparently became hypoxic and hypoglycemic on night shift. Was found with non rebreathe, O2 sat was 100% with no distress. Place back on Nasal cannula at 3L, I remained and observe patient for 15 minutes and he never drop below 93%. Pulse ox remain on finger and sitter was ask to observe if he desats. Capillary glucose was 62mg /dl this am provider was informed of over night event. D/W 50 cc 50% administered I.V. Wife at bed side plan discuss with her. Patient awaiting CT scan.  Glucose rechecked- at 8:08 156 mg/dl.

## 2024-07-12 NOTE — Progress Notes (Signed)
 PROGRESS NOTE    Luke Chen   FMW:[id number] DOB: 10/16/1940  DOA: 07/04/2024 Date of Service: 07/12/24 which is hospital day 7  PCP: P.A., Sheralyn Flock, MD    Hospital course / significant events:   Luke Chen is a 83 y.o. 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   HPI: Wife at the bedside who gives a history, states that he had been declining for some time, sleeping for most of the day. States the iron infusions have not been helping him as expected. Due to increased concern she called EMS to bring him in.   11/21: to ED. Started insulin  drip.  11/22: admitted to hospitalist service, hyperglycemia, hypotension, AKI, failure to thrive. Continue abx, cautious hydration.  11/23: hypotensive, adjusting cardiac meds. PT/OT recs for LTC 11/24:  wife has goal for pt to go abck home and not LTC. Palliative care consulted 11/25: palliative care unable to reach pt's wife.  11/26: palliative care met w/ patient's wife. On rounds, I also discussed goals / options. Wife is back and forth - TOC following, see A/P. Pt has received 5 days abx, UCx mult species, will give last dose abx today 11/27: unable to reach pts wife. Clinically no change but pt appears comfortable.  11/28: d/w wife at bedside, dispo still in process, see below, pt not improving. PT/OT to try to see this afternoon while wife is present --> see IPAL note. Confirm for DNR which is appropriate. Otherwise treat as able, will work on SNF rehab placement  11/29: hypoxic overnight, lasix ordered. This AM CT Chest (+)effusion --> thora. Continue lasix.      Consultants:  Palliative care  Interventional Radiology  Procedures/Surgeries: none      ASSESSMENT & PLAN:   Generalized weakness Possible  urinary tract infection Dehydration/hypotension Suspect multifactorial, including dehydration, possible underlying infection, hyperglycemia, with background of anemia and frailty  Troponin elevated but downtrended so low suspicion for ACS Urinalysis shows possible UTI completed ceftriaxone  Fall and aspiration precautions PT OT consulted and recommending SNF - see below    Acute hypoxic respiratory failure d/t pleural effusion Known elevated L hemidiaphragm abn CXR clinical significant as this may make imaging unclear w/ this modality  CT chest this morning concerning for pleural effusion --> thoracentesis ordered  OF NOTE - pt's wife has declined chest tube if there is a complication of severe PTX/collapse. She has declined any sedating medications, if pt is not tolerating the procedure then will abort the procedure.   Chronic HFrEF (heart failure with reduced ejection fraction) with acute exacerbation Echo last month at [facility] EF 20 Have had to hold GDMT meds due to soft blood pressure Monitor input and output Thoracentesis as above  Lasix   Dementia with behavioral disturbance I have discussed w/ Mrs. Hsiung that he is fitting the clinical diagnosis of worsening vascular dementia. I affirm that an abrupt change can be very difficult to witness and to manage. Mrs. Ishida believes that sedating medication is cause for his problems now. I have affirmed that sedating medication can certainly alter a patient temporarily but these medications are not affecting him now. I have been firm that I will support use of these medications if there is a need to use them for his safety or for the staff's safety. We  will try to not use meds but I have been clear that I do not think such medications are the main factor in his decline, but rather the dementia is the problem in addition to the DKA/AKI, CHF, poor po intake  Delirium precautions Continue Aricept  Fall and aspiration precautions One-to-one  precaution  Haldol  prn - please limit use to absolute necessity for safety and then at lowest effective dose  Hyperglycemia - resolved Hypoglycemia d/t poor po intake  Patient has been weaned off insulin  drip SSI insulin  q4h held d/t hypoglycemia D50 prn    Elevated troponin CAD s/p stent to LCx and LAD Suspect demand ischemia.  Troponin 143-> 128 Recheck as needed   AKI (acute kidney injury)-resolved Creatinine 1.36 up from baseline of 0.9 on 10/7 Renal function back to baseline Monitor renal function and avoid nephrotoxins    Chronic anticoagulation History of LV thrombus hold apixaban  for now, radiology aware    IDA (iron deficiency anemia) On iron transfusion, last transfusion 11/17 Monitor CBC Hemoglobin fairly stable   Essential hypertension Holding antihypertensives due to soft blood pressure   Advanced care planning Pt's wife initially didn't want SNF but now concerned she will not be able to care for patient adequately (she was under impression home health would include more intensive / frequent home visits), she had not wanted hospice but now is open to it. TOC following  SNF/LTC is safest option for the patient. I have been clear w/ Mrs. Ruffini that this is my recommendation. She is willing at this time to pursue SNF rehab and I have been clear that we may be limited there on bed offers if he continues to need mitts or sitter, continues to demonstrate confusion I have also been clear that we will support her if she chooses to take him home. If home, would have hospice follow along but they cannot offer much in the way of day-to-day help.  See IPAL note 07/11/24 D/w palliative team yesterday, they will follow up today     No concerns based on BMI: Body mass index is 20.59 kg/m.SABRA Significantly low or high BMI is associated with higher medical risk.  Underweight - under 18  overweight - 25 to 29 obese - 30 or more Class 1 obesity: BMI of 30.0 to 34 Class 2 obesity:  BMI of 35.0 to 39 Class 3 obesity: BMI of 40.0 to 49 Super Morbid Obesity: BMI 50-59 Super-super Morbid Obesity: BMI 60+ Healthy nutrition and physical activity advised as adjunct to other disease management and risk reduction treatments    DVT prophylaxis: eliquis  - holding for now  IV fluids: no continuous IV fluids  Nutrition: as tolerated Central lines / other devices: none  Code Status: DNR ACP documentation reviewed: none on file in [facility]  [facility] needs: placement/ home services  Medical barriers to dispo: none.             Subjective / Brief ROS:  Patient not contibutory  Family Communication: wife at bedside - see A/P for discussion re: thoracentesis. I was in the room w/ Carlin Griffon PA-C w/ IR while he discussed thora risk/benefit/plan w/ her     Objective Findings:  Vitals:   07/11/24 2148 07/12/24 0509 07/12/24 0719 07/12/24 0800  BP: (!) 111/92 109/68 125/87   Pulse: 92 71 88   Resp: 16 18 16    Temp: 98.4 F (36.9 C) 98.4 F (36.9 C) (!) 97.5 F (36.4 C)   TempSrc:   Axillary   SpO2: ROLLEN)  86% (!) 83% 100% 95%  Weight:      Height:        Intake/Output Summary (Last 24 hours) at 07/12/2024 1021 Last data filed at 07/12/2024 0959 Gross per 24 hour  Intake 0 ml  Output 900 ml  Net -900 ml   Filed Weights   07/04/24 2347 07/05/24 2000 07/06/24 0326  Weight: 54.4 kg 63.9 kg 63.2 kg    Examination:  Physical Exam Constitutional:      General: He is not in acute distress. Cardiovascular:     Rate and Rhythm: Normal rate and regular rhythm.  Pulmonary:     Effort: No respiratory distress.     Breath sounds: Normal breath sounds.     Comments: Diminshed breath sounds  Abdominal:     Palpations: Abdomen is soft.  Musculoskeletal:     Right lower leg: No edema.     Left lower leg: No edema.  Skin:    General: Skin is warm and dry.     Coloration: Skin is pale.  Neurological:     Mental Status: He is disoriented.  Psychiatric:         Behavior: Behavior normal.          Scheduled Medications:   acetaminophen   650 mg Oral TID   atorvastatin   40 mg Oral Daily   dextrose   1 ampule Intravenous Once   donepezil   10 mg Oral Daily   feeding supplement  237 mL Oral BID BM    morphine injection  2 mg Intravenous Once    Continuous Infusions:    PRN Medications:  acetaminophen  **OR** acetaminophen , dextrose , glucagon (human recombinant), haloperidol  lactate, ipratropium-albuterol , ondansetron  **OR** ondansetron  (ZOFRAN ) IV, mouth rinse  Antimicrobials from admission:  Anti-infectives (From admission, onward)    Start     Dose/Rate Route Frequency Ordered Stop   07/05/24 1730  cefTRIAXone  (ROCEPHIN ) 1 g in sodium chloride  0.9 % 100 mL IVPB  Status:  Discontinued        1 g 200 mL/hr over 30 Minutes Intravenous Every 24 hours 07/05/24 1622 07/10/24 0835           Data Reviewed:  I have personally reviewed the following...  CBC: Recent Labs  Lab 07/06/24 0828 07/07/24 0523 07/08/24 0524 07/09/24 0526 07/11/24 1010  WBC 10.6* 8.7 11.3* 9.4 7.1  NEUTROABS 7.3 5.4 7.9* 5.7  --   HGB 10.5* 10.3* 10.6* 10.6* 10.6*  HCT 32.6* 31.1* 32.9* 32.2* 33.5*  MCV 89.8 88.9 91.6 88.7 92.5  PLT 198 194 224 204 191   Basic Metabolic Panel: Recent Labs  Lab 07/06/24 0606 07/07/24 0523 07/08/24 0524 07/09/24 0526 07/11/24 1010  NA 141 141 140 140 141  K 3.6 3.9 4.4 4.1 4.9  CL 108 109 109 108 107  CO2 22 22 18* 19* 18*  GLUCOSE 60* 133* 126* 87 249*  BUN 39* 37* 42* 46* 40*  CREATININE 1.10 1.09 1.15 1.07 1.22  CALCIUM  8.8* 9.0 9.0 9.0 9.2   GFR: Estimated Creatinine Clearance: 42.4 mL/min (by C-G formula based on SCr of 1.22 mg/dL). Liver Function Tests: No results for input(s): AST, ALT, ALKPHOS, BILITOT, PROT, ALBUMIN in the last 168 hours. No results for input(s): LIPASE, AMYLASE in the last 168 hours. No results for input(s): AMMONIA in the last 168 hours. Coagulation  Profile: No results for input(s): INR, PROTIME in the last 168 hours. Cardiac Enzymes: No results for input(s): CKTOTAL, CKMB, CKMBINDEX, TROPONINI in the last 168 hours.  BNP (  last 3 results) No results for input(s): PROBNP in the last 8760 hours. HbA1C: No results for input(s): HGBA1C in the last 72 hours. CBG: Recent Labs  Lab 07/12/24 0100 07/12/24 0402 07/12/24 0511 07/12/24 0725 07/12/24 0810  GLUCAP 135* 72 82 62* 154*   Lipid Profile: No results for input(s): CHOL, HDL, LDLCALC, TRIG, CHOLHDL, LDLDIRECT in the last 72 hours. Thyroid  Function Tests: No results for input(s): TSH, T4TOTAL, FREET4, T3FREE, THYROIDAB in the last 72 hours. Anemia Panel: No results for input(s): VITAMINB12, FOLATE, FERRITIN, TIBC, IRON, RETICCTPCT in the last 72 hours. Most Recent Urinalysis On File:     Component Value Date/Time   COLORURINE AMBER (A) 07/05/2024 0441   APPEARANCEUR HAZY (A) 07/05/2024 0441   LABSPEC 1.020 07/05/2024 0441   PHURINE 5.0 07/05/2024 0441   GLUCOSEU >=500 (A) 07/05/2024 0441   HGBUR MODERATE (A) 07/05/2024 0441   BILIRUBINUR NEGATIVE 07/05/2024 0441   BILIRUBINUR neg 01/02/2022 1407   KETONESUR NEGATIVE 07/05/2024 0441   PROTEINUR 100 (A) 07/05/2024 0441   UROBILINOGEN negative (A) 01/02/2022 1407   NITRITE NEGATIVE 07/05/2024 0441   LEUKOCYTESUR TRACE (A) 07/05/2024 0441   Sepsis Labs: @LABRCNTIP (procalcitonin:4,lacticidven:4) Microbiology: Recent Results (from the past 240 hours)  MRSA Next Gen by PCR, Nasal     Status: None   Collection Time: 07/05/24  2:00 AM   Specimen: Nasal Mucosa; Nasal Swab  Result Value Ref Range Status   MRSA by PCR Next Gen NOT DETECTED NOT DETECTED Final    Comment: (NOTE) The GeneXpert MRSA Assay (FDA approved for NASAL specimens only), is one component of a comprehensive MRSA colonization surveillance program. It is not intended to diagnose MRSA infection nor to  guide or monitor treatment for MRSA infections. Test performance is not FDA approved in patients less than 31 years old. Performed at [facility], [address], [place], [place] [address]   Urine Culture     Status: Abnormal   Collection Time: 07/05/24  4:41 AM   Specimen: Urine, Random  Result Value Ref Range Status   Specimen Description   Final    URINE, RANDOM Performed at [facility], [address] [place], [place] [address]    Special Requests   Final    NONE Reflexed from [id number] Performed at [facility], [address] [place], [place] [address]    Culture MULTIPLE SPECIES PRESENT, SUGGEST RECOLLECTION (A)  Final   Report Status 07/06/2024 FINAL  Final      Radiology Studies last 3 days: DG Chest 1 View Result Date: 07/10/2024 EXAM: 1 VIEW(S) XRAY OF THE CHEST 07/10/2024 03:54:00 AM COMPARISON: 07/04/2024 CLINICAL HISTORY: Dyspnea FINDINGS: LUNGS AND PLEURA: There are low lung volumes with hazy opacification of both lung bases and obscuration of the hemidiaphragms. No pleural effusion. No pneumothorax. HEART AND MEDIASTINUM: There is calcification within the aortic arch. No acute abnormality of the cardiac and mediastinal silhouettes. BONES AND SOFT TISSUES: No acute osseous abnormality. IMPRESSION: 1. Low lung volumes with hazy opacification of both lung bases and obscuration of the hemidiaphragms, likely secondary to a combination of pleural effusions and atelectasis. Electronically signed by: Evalene Coho MD 07/10/2024 04:04 AM EST RP Workstation: DARYLENE Laneta Blunt, DO [facility] 07/12/2024, 10:21 AM    Dictation software may have been used to generate the above note. Typos may occur and escape review in typed/dictated notes. Please contact Dr Blunt directly for clarity if needed.  Staff  may message me via secure chat in Epic  but this may not receive an immediate response,  please page  me for urgent matters!  If 7PM-7AM, please contact night coverage [email or web address]

## 2024-07-12 NOTE — Progress Notes (Signed)
 Called by covering RN to assess patient secondary to low saturation. Upon entry into the room, noticed patient is unarousable and barely responds to stimuli. Respirations shallow, no distress noted. Placed patient on NRB, RN is aware.

## 2024-07-12 NOTE — Progress Notes (Signed)
 The patient's O2 Sat was at 86% on 2LPM via [place]. This clinical research associate gave PRN neb tx, O2 Sat down to 83% on 2LPM via [place]. This clinical research associate notified Dr. Madison Peaches and received new orders for Lasix and Morphine. Respiratory notified to come to assess the patient.

## 2024-07-12 NOTE — Plan of Care (Signed)
  Problem: Education: Goal: Knowledge of General Education information will improve Description: Including pain rating scale, medication(s)/side effects and non-pharmacologic comfort measures Outcome: Progressing   Problem: Health Behavior/Discharge Planning: Goal: Ability to manage health-related needs will improve Outcome: Progressing   Problem: Elimination: Goal: Will not experience complications related to bowel motility Outcome: Progressing   Problem: Pain Managment: Goal: General experience of comfort will improve and/or be controlled Outcome: Progressing   Problem: Safety: Goal: Ability to remain free from injury will improve Outcome: Progressing   Problem: Education: Goal: Ability to describe self-care measures that may prevent or decrease complications (Diabetes Survival Skills Education) will improve Outcome: Progressing   Problem: Tissue Perfusion: Goal: Adequacy of tissue perfusion will improve Outcome: Progressing   Problem: Clinical Measurements: Goal: Ability to maintain clinical measurements within normal limits will improve Outcome: Not Progressing/ no change in condition   Problem: Activity: Goal: Risk for activity intolerance will decrease Outcome: Not Progressing   Problem: Nutrition: Goal: Adequate nutrition will be maintained Outcome: Not Progressing/ not eating or drinking

## 2024-07-12 NOTE — Progress Notes (Signed)
 The patient's CBG came up to 135.

## 2024-07-12 NOTE — Progress Notes (Signed)
 The patient's wife refused the 0630 morphine. The patient continues on NRB. O2 Sat 75% on 2LPM. Wife informed of new plan of care and is in agreement except for the Morphine.

## 2024-07-12 NOTE — Progress Notes (Signed)
 Patient taken off floor on 3 L of O2 for thoracentesis, Sitter accompanied patient.

## 2024-07-12 NOTE — Procedures (Addendum)
 PROCEDURE SUMMARY:  Successful image-guided diagnostic and therapeutic left-sided thoracentesis. Yielded 04.0 liters of clear, straw-colored pleural fluid. Patient tolerated procedure well. EBL: Zero No immediate complications.  Specimen was sent for labs. Post procedure CXR is pending.  Please see imaging section of Epic for full dictation.  Carlin LABOR Jo-Anne Kluth PA-C 07/12/2024 11:09 AM

## 2024-07-12 NOTE — Progress Notes (Signed)
 Daily Progress Note   Patient Name: Luke Chen       Date: 07/12/2024 DOB: 1941/06/25  Age: 83 y.o. MRN#: [id number] Attending Physician: Marsa Edelman, DO Primary Care Physician: CONCHA Sheralyn Flock, MD Admit Date: 07/04/2024  Reason for Consultation/Follow-up: Establishing goals of care  HPI/Brief Hospital Review: 83 y.o. male with past medical history of dementia, DM- insulin  dependent, HTN, stroke, CHF, CAD, iron deficiency anemia on iron infusions, admitted on 07/04/2024 with generalized weakness. Workup revealed dehydration, UTI, and DKA. Palliative medicine consulted for goals of care.   Subjective: Extensive chart review has been completed prior to meeting patient including labs, vital signs, imaging, progress notes, orders, and available advanced directive documents from current and previous encounters.    Visited with [name] at his bedside. Initially, [name] off floor having thoracentesis. Wife-[name] at bedside during time of visit. [name] returned from procedure soon after we started discussing. [name] able to tolerate thoracentesis--removal of 400 cc fluid.  [name] unable to participate in symptom review. He appears comfortable without signs of distress, respirations even and unlabored, no obvious signs of discomfort.  [name] is resting in bed with eyes closed, he responds with brief eye opening and nodding of his head to simple questions. He is unable to carry on a meaningful conversation and easily drifts back to sleep without constant redirection. Per nursing, he was able to tolerate a container full of applesauce earlier today with medications crushed in.  Ms. [name] shares her frustrations around the care that has been provided. She specifically  shares her frustrations around medications administered to [name] for [employer]. She shares his last dose was on Wednesday night and he has not been awake since or had substantial intake. Per review of MAR, [name] received  Haldol  2 mg IM due to agitation. Per his wife, she shares it takes 2-3 days for that type of medication to be out of [name]' system. Shared with her concern related to today being day 3 without significant improvement. We discussed possibility of current mentation/cognition being a result of [name] underlying dementia.  Visitors at bedside, made plan to return to bedside later to continue discussions with [name].  Since initial visit earlier in day, per [name] and sitter at bedside [name] was able to tolerate more  applesauce and pills crushed. Discussed concern and importance of following aspiration precautions--ensuring [name] is awake and alert enough to swallow safely.  [name] shares at baseline, [name] has struggled with his appetite for some time due to a medication he placed on for diabetes. She shares since discontinuing that medication is appetite has somewhat improved.  We discussed overall poor prognosis for [name] and concern for his high risk of further complications/decompensation. We discussed the natural disease trajectory of [name] underlying chronic conditions, specifically dementia and cardiac disease. [name] is clear she wishes to continue with current plan of care allowing more time for outcomes.  Answered and addressed all questions and concerns. PMT to continue to follow for ongoing needs and support.   Objective:  Physical Exam Constitutional:      Appearance: He is cachectic. He is ill-appearing.  HENT:     Head: Normocephalic.     Mouth/Throat:     Mouth: Mucous membranes are dry.  Pulmonary:     Effort: Pulmonary effort is normal. No respiratory distress.  Abdominal:     General: There is no distension.      Palpations: Abdomen is soft.     Tenderness: There is no abdominal tenderness.  Skin:    Coloration: Skin is pale.     Findings: Bruising present.  Neurological:     Mental Status: He is lethargic.     Motor: Weakness present.     Comments: Oriented to self             Vital Signs: BP 104/71 (BP Location: Right Arm)   Pulse 74   Temp (!) 97.5 F (36.4 C) (Axillary)   Resp 14   Ht 5' 9 (1.753 m)   Wt 63.2 kg   SpO2 100%   BMI 20.59 kg/m  SpO2: SpO2: 100 % O2 Device: O2 Device: Nasal Cannula O2 Flow Rate: O2 Flow Rate (L/min): 3 L/min   Palliative Care Assessment & Plan   Assessment/Recommendation/Plan  Continue with current plan of care Risk for decompensation--overall poor prognosis Continue GOC with family  Care plan was discussed with primary team and nursing staff.  Thank you for allowing the Palliative Medicine Team to assist in the care of this patient.  Visit includes: Detailed review of medical records (labs, imaging, vital signs), medically appropriate exam (mental status, respiratory, cardiac, skin), discussed with treatment team, counseling and educating patient, family and staff, documenting clinical information, medication management and coordination of care.  Waddell Lesches, DNP, AGNP-C Palliative Medicine   Please contact Palliative Medicine Team phone at [phone] for questions and concerns.

## 2024-07-13 DIAGNOSIS — Z9889 Other specified postprocedural states: Secondary | ICD-10-CM | POA: Diagnosis not present

## 2024-07-13 DIAGNOSIS — J90 Pleural effusion, not elsewhere classified: Secondary | ICD-10-CM | POA: Diagnosis not present

## 2024-07-13 DIAGNOSIS — Z515 Encounter for palliative care: Secondary | ICD-10-CM | POA: Diagnosis not present

## 2024-07-13 DIAGNOSIS — R531 Weakness: Secondary | ICD-10-CM | POA: Diagnosis not present

## 2024-07-13 LAB — CBC
HCT: 39.3 % (ref 39.0–52.0)
Hemoglobin: 12.6 g/dL — ABNORMAL LOW (ref 13.0–17.0)
MCH: 29.8 pg (ref 26.0–34.0)
MCHC: 32.1 g/dL (ref 30.0–36.0)
MCV: 92.9 fL (ref 80.0–100.0)
Platelets: 198 K/uL (ref 150–400)
RBC: 4.23 MIL/uL (ref 4.22–5.81)
RDW: 24.1 % — ABNORMAL HIGH (ref 11.5–15.5)
WBC: 9.5 K/uL (ref 4.0–10.5)
nRBC: 0 % (ref 0.0–0.2)

## 2024-07-13 LAB — BASIC METABOLIC PANEL WITH GFR
Anion gap: 15 (ref 5–15)
BUN: 36 mg/dL — ABNORMAL HIGH (ref 8–23)
CO2: 23 mmol/L (ref 22–32)
Calcium: 9.5 mg/dL (ref 8.9–10.3)
Chloride: 104 mmol/L (ref 98–111)
Creatinine, Ser: 1.3 mg/dL — ABNORMAL HIGH (ref 0.61–1.24)
GFR, Estimated: 55 mL/min — ABNORMAL LOW (ref >60)
Glucose, Bld: 306 mg/dL — ABNORMAL HIGH (ref 70–99)
Potassium: 4.6 mmol/L (ref 3.5–5.1)
Sodium: 141 mmol/L (ref 135–145)

## 2024-07-13 LAB — GLUCOSE, CAPILLARY
Glucose-Capillary: 158 mg/dL — ABNORMAL HIGH (ref 70–99)
Glucose-Capillary: 263 mg/dL — ABNORMAL HIGH (ref 70–99)
Glucose-Capillary: 239 mg/dL — ABNORMAL HIGH (ref 70–99)
Glucose-Capillary: 237 mg/dL — ABNORMAL HIGH (ref 70–99)
Glucose-Capillary: 179 mg/dL — ABNORMAL HIGH (ref 70–99)
Glucose-Capillary: 149 mg/dL — ABNORMAL HIGH (ref 70–99)
Glucose-Capillary: 168 mg/dL — ABNORMAL HIGH (ref 70–99)

## 2024-07-13 LAB — LACTATE DEHYDROGENASE: LDH: 291 U/L — ABNORMAL HIGH (ref 105–235)

## 2024-07-13 MED ORDER — APIXABAN 5 MG PO TABS
5.0000 mg | ORAL_TABLET | Freq: Two times a day (BID) | ORAL | Status: DC
  Administered 2024-07-13 – 2024-07-17 (×10): 5 mg via ORAL
  Filled 2024-07-13 (×10): qty 1

## 2024-07-13 MED ORDER — INSULIN ASPART 100 UNIT/ML IJ SOLN
0.0000 [IU] | INTRAMUSCULAR | Status: DC
  Administered 2024-07-13 (×2): 3 [IU] via SUBCUTANEOUS
  Administered 2024-07-13: 2 [IU] via SUBCUTANEOUS
  Administered 2024-07-14 – 2024-07-15 (×3): 3 [IU] via SUBCUTANEOUS
  Administered 2024-07-15: 1 [IU] via SUBCUTANEOUS
  Filled 2024-07-13: qty 3
  Filled 2024-07-13: qty 2
  Filled 2024-07-13 (×3): qty 3
  Filled 2024-07-13: qty 2
  Filled 2024-07-13: qty 3
  Filled 2024-07-13: qty 1

## 2024-07-13 NOTE — Progress Notes (Signed)
 The patient has been restless tonight and trying to climb out of bed. 1:1 sitter continues.

## 2024-07-13 NOTE — Progress Notes (Signed)
 PROGRESS NOTE    Luke Chen   FMW:[id number] DOB: 1940-11-22  DOA: 07/04/2024 Date of Service: 07/13/24 which is hospital day 8  PCP: P.A., Sheralyn Flock, MD    Hospital course / significant events:   Luke Chen is a 83 y.o. 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   HPI: Wife at the bedside who gives a history, states that he had been declining for some time, sleeping for most of the day. States the iron infusions have not been helping him as expected. Due to increased concern she called EMS to bring him in.   11/21: to ED. Started insulin  drip.  11/22: admitted to hospitalist service, hyperglycemia, hypotension, AKI, failure to thrive. Continue abx, cautious hydration. Got Haldol  x1 for sundowning.  11/23: hypotensive, adjusting cardiac meds. PT/OT recs for LTC 11/24:  wife has goal for pt to go abck home and not LTC. Palliative care consulted 11/25: palliative care unable to reach pt's wife.  11/26: palliative care met w/ patient's wife. On rounds, I also discussed goals / options. Wife is back and forth - TOC following, see A/P. Pt has received 5 days abx, UCx mult species, will give last dose abx today 11/27: unable to reach pts wife. Clinically no change but pt appears comfortable. last dose Haldol  was overnight 11/27 00:52 11/28: d/w wife at bedside, dispo still in process, see below, pt not improving. PT/OT to try to see this afternoon while wife is present --> see IPAL note. Confirm for DNR which is appropriate. Otherwise treat as able, will work on SNF rehab placement  11/29: hypoxic overnight, lasix ordered. This AM CT Chest (+)effusion --> thora. Continue lasix. Palliative care saw pt today and d/w wife.  11/30: 3.2L UOP yesterday, mild AKI now, will get 1  dose lasix this AM and continue monitor output. Pt appears comfortable. See A/P for GOC discussion      Consultants:  Palliative care  Interventional Radiology  Procedures/Surgeries: L Thoracentesis 07/12/24 removal 400 cc      ASSESSMENT & PLAN:   Generalized weakness Possible urinary tract infection Dehydration/hypotension Suspect multifactorial, including dehydration, possible underlying infection, hyperglycemia, with background of anemia and frailty  Troponin elevated but downtrended so low suspicion for ACS Urinalysis shows possible UTI completed ceftriaxone  Fall and aspiration precautions PT OT consulted and recommending SNF - see below    Acute hypoxic respiratory failure d/t pleural effusion Known elevated L hemidiaphragm abn CXR clinical significant as this may make imaging unclear w/ this modality  CT chest this morning concerning for pleural effusion --> thoracentesis ordered  OF NOTE - pt's wife has declined chest tube if there is a complication of severe PTX/collapse. She has declined any sedating medications, if pt is not tolerating the procedure then will abort the procedure.   Chronic HFrEF (heart failure with reduced ejection fraction) with acute exacerbation Echo last month at [facility] EF 20 Have had to hold GDMT meds due to soft blood pressure Monitor input and output Thoracentesis as above  Lasix   Dementia with behavioral disturbance I have discussed w/ Mrs. Klugh that he is fitting the clinical diagnosis of worsening vascular dementia. I affirmed that an abrupt change like this can be very difficult to witness and to manage. Mrs. Mysliwiec believes that sedating medication is cause  for his problems now. Note: last dose Haldol  was 11/27 00:52, >72h ago. I have affirmed that sedating medication can certainly alter a patient temporarily but these medications are not affecting him now. I have been firm that I will support use of these medications if there is a need  to use them for his safety or for the staff's safety. We will try to not use meds but I have been clear that I do not think such medications are the main factor in his decline, but rather the dementia is the problem in addition to the DKA/AKI, CHF, poor po intake  Delirium precautions Continue Aricept  Fall and aspiration precautions One-to-one precaution  Haldol  prn - please limit use to absolute necessity for safety and then at lowest effective dose Palliative also following   Hyperglycemia  Hypoglycemia d/t poor po intake  Patient has been weaned off insulin  drip SSI insulin  q4h was held d/t hypoglycemia now reinitiated today d/t Glc 200+ without substantial po intake  D50 prn    Elevated troponin CAD s/p stent to LCx and LAD Suspect demand ischemia.  Troponin 143-> 128 Recheck as needed   AKI (acute kidney injury)-resolved Creatinine 1.36 up from baseline of 0.9 on 10/7 Renal function back to baseline Monitor renal function and avoid nephrotoxins    Chronic anticoagulation History of LV thrombus Restart apixiban    IDA (iron deficiency anemia) On iron transfusion, last transfusion 11/17 Monitor CBC Hemoglobin fairly stable   Essential hypertension Holding antihypertensives due to soft blood pressure and also he is getting glasix    Advanced care planning See previous notes. Plan was for SNF rehab but I do not think Mr Claudene can engage w/ therapies at this time.   07/13/24 spoke again w/ Mrs Milissa w/ [place] of Palliative Care team at bedside. Mrs Kresse seems to demonstrate a good understanding of patient's medical situation, including CHF, DKA, dementia, etc but she remains fixated on the Haldol  pt received as the cause of all this. I stated that regardless of the Haldol  which has nothing to do w/ previous dc CHF, right now my biggest concern is his heart failure. If that worsens despite our best efforts then he will get short of breath and feel suffocating. I will not let  him be in respiratory distress if there is no hope of him recovering, we will discuss morphine at end-of-life. For now he seems stable but I am not optimistic about his prognosis. She states she will call his doctor at [facility] about taking him there, I stated that I am happy to discuss w/ that doctor if she would like me to, but Mr Yanes is not in a situation that would indicate transfer of facility, so I will certainly not guarantee a transfer.     No concerns based on BMI: Body mass index is 20.59 kg/m.SABRA Significantly low or high BMI is associated with higher medical risk.  Underweight - under 18  overweight - 25 to 29 obese - 30 or more Class 1 obesity: BMI of 30.0 to 34 Class 2 obesity: BMI of 35.0 to 39 Class 3 obesity: BMI of 40.0 to 49 Super Morbid Obesity: BMI 50-59 Super-super Morbid Obesity: BMI 60+ Healthy nutrition and physical activity advised as adjunct to other disease management and risk reduction treatments    DVT prophylaxis: eliquis  - holding for now  IV fluids: no continuous IV fluids  Nutrition: as tolerated Central lines / other devices: none  Code Status: DNR ACP documentation  reviewed: none on file in [facility]  [facility] needs: placement/ home services  Medical barriers to dispo: none.             Subjective / Brief ROS:  Patient not contributory He awakens to voice but does not verbalize anything coherent  He appears comfortable  Family Communication: wife at bedside - see A/P for discussion     Objective Findings:  Vitals:   07/12/24 1934 07/12/24 2343 07/13/24 0413 07/13/24 0754  BP:  115/76 (!) 108/90 108/72  Pulse:  98 87 81  Resp:    17  Temp:  98.1 F (36.7 C) 97.9 F (36.6 C) 97.7 F (36.5 C)  TempSrc:  Axillary Axillary Oral  SpO2: 99% 90% 98% 97%  Weight:      Height:        Intake/Output Summary (Last 24 hours) at 07/13/2024 1235 Last data filed at 07/13/2024 1140 Gross per 24 hour  Intake 297 ml  Output 2950 ml   Net -2653 ml   Filed Weights   07/04/24 2347 07/05/24 2000 07/06/24 0326  Weight: 54.4 kg 63.9 kg 63.2 kg    Examination:  Physical Exam Constitutional:      General: He is not in acute distress. Cardiovascular:     Rate and Rhythm: Normal rate and regular rhythm.  Pulmonary:     Effort: No respiratory distress.     Breath sounds: Normal breath sounds.     Comments: Diminshed breath sounds  Abdominal:     Palpations: Abdomen is soft.  Musculoskeletal:     Right lower leg: No edema.     Left lower leg: No edema.  Skin:    General: Skin is warm and dry.     Coloration: Skin is pale.  Neurological:     Mental Status: He is disoriented.  Psychiatric:        Behavior: Behavior normal.          Scheduled Medications:   acetaminophen   650 mg Oral TID   apixaban   5 mg Oral BID   atorvastatin   40 mg Oral Daily   dextrose   1 ampule Intravenous Once   donepezil   10 mg Oral Daily   feeding supplement  237 mL Oral BID BM   insulin  aspart  0-9 Units Subcutaneous Q4H    morphine injection  2 mg Intravenous Once    Continuous Infusions:    PRN Medications:  acetaminophen  **OR** acetaminophen , dextrose , glucagon (human recombinant), haloperidol  lactate, ipratropium-albuterol , ondansetron  **OR** ondansetron  (ZOFRAN ) IV, mouth rinse  Antimicrobials from admission:  Anti-infectives (From admission, onward)    Start     Dose/Rate Route Frequency Ordered Stop   07/05/24 1730  cefTRIAXone  (ROCEPHIN ) 1 g in sodium chloride  0.9 % 100 mL IVPB  Status:  Discontinued        1 g 200 mL/hr over 30 Minutes Intravenous Every 24 hours 07/05/24 1622 07/10/24 0835           Data Reviewed:  I have personally reviewed the following...  CBC: Recent Labs  Lab 07/07/24 0523 07/08/24 0524 07/09/24 0526 07/11/24 1010 07/12/24 1011 07/13/24 0929  WBC 8.7 11.3* 9.4 7.1 8.4 9.5  NEUTROABS 5.4 7.9* 5.7  --  5.5  --   HGB 10.3* 10.6* 10.6* 10.6* 11.9* 12.6*  HCT 31.1* 32.9*  32.2* 33.5* 37.2* 39.3  MCV 88.9 91.6 88.7 92.5 93.2 92.9  PLT 194 224 204 191 185 198   Basic Metabolic Panel: Recent Labs  Lab 07/08/24 0524 07/09/24  9473 07/11/24 1010 07/12/24 1011 07/13/24 0929  NA 140 140 141 142 141  K 4.4 4.1 4.9 4.9 4.6  CL 109 108 107 107 104  CO2 18* 19* 18* 23 23  GLUCOSE 126* 87 249* 193* 306*  BUN 42* 46* 40* 39* 36*  CREATININE 1.15 1.07 1.22 1.16 1.30*  CALCIUM  9.0 9.0 9.2 9.4 9.5   GFR: Estimated Creatinine Clearance: 39.8 mL/min (A) (by C-G formula based on SCr of 1.3 mg/dL (H)). Liver Function Tests: Recent Labs  Lab 07/12/24 1011  AST 47*  ALT 58*  ALKPHOS 195*  BILITOT 0.6  PROT 6.4*  ALBUMIN 3.7   No results for input(s): LIPASE, AMYLASE in the last 168 hours. No results for input(s): AMMONIA in the last 168 hours. Coagulation Profile: No results for input(s): INR, PROTIME in the last 168 hours. Cardiac Enzymes: No results for input(s): CKTOTAL, CKMB, CKMBINDEX, TROPONINI in the last 168 hours.  BNP (last 3 results) No results for input(s): PROBNP in the last 8760 hours. HbA1C: No results for input(s): HGBA1C in the last 72 hours. CBG: Recent Labs  Lab 07/12/24 1926 07/12/24 2339 07/13/24 0403 07/13/24 0736 07/13/24 1128  GLUCAP 226* 245* 263* 239* 237*   Lipid Profile: No results for input(s): CHOL, HDL, LDLCALC, TRIG, CHOLHDL, LDLDIRECT in the last 72 hours. Thyroid  Function Tests: No results for input(s): TSH, T4TOTAL, FREET4, T3FREE, THYROIDAB in the last 72 hours. Anemia Panel: No results for input(s): VITAMINB12, FOLATE, FERRITIN, TIBC, IRON, RETICCTPCT in the last 72 hours. Most Recent Urinalysis On File:     Component Value Date/Time   COLORURINE AMBER (A) 07/05/2024 0441   APPEARANCEUR HAZY (A) 07/05/2024 0441   LABSPEC 1.020 07/05/2024 0441   PHURINE 5.0 07/05/2024 0441   GLUCOSEU >=500 (A) 07/05/2024 0441   HGBUR MODERATE (A) 07/05/2024 0441    BILIRUBINUR NEGATIVE 07/05/2024 0441   BILIRUBINUR neg 01/02/2022 1407   KETONESUR NEGATIVE 07/05/2024 0441   PROTEINUR 100 (A) 07/05/2024 0441   UROBILINOGEN negative (A) 01/02/2022 1407   NITRITE NEGATIVE 07/05/2024 0441   LEUKOCYTESUR TRACE (A) 07/05/2024 0441   Sepsis Labs: @LABRCNTIP (procalcitonin:4,lacticidven:4) Microbiology: Recent Results (from the past 240 hours)  MRSA Next Gen by PCR, Nasal     Status: None   Collection Time: 07/05/24  2:00 AM   Specimen: Nasal Mucosa; Nasal Swab  Result Value Ref Range Status   MRSA by PCR Next Gen NOT DETECTED NOT DETECTED Final    Comment: (NOTE) The GeneXpert MRSA Assay (FDA approved for NASAL specimens only), is one component of a comprehensive MRSA colonization surveillance program. It is not intended to diagnose MRSA infection nor to guide or monitor treatment for MRSA infections. Test performance is not FDA approved in patients less than 75 years old. Performed at [facility], [address], [place], [place] [address]   Urine Culture     Status: Abnormal   Collection Time: 07/05/24  4:41 AM   Specimen: Urine, Random  Result Value Ref Range Status   Specimen Description   Final    URINE, RANDOM Performed at [facility], [address] [place], [place] [address]    Special Requests   Final    NONE Reflexed from [id number] Performed at [facility], [address] [place], [place] [address]    Culture MULTIPLE SPECIES PRESENT, SUGGEST RECOLLECTION (A)  Final   Report Status 07/06/2024 FINAL  Final  Body fluid culture w Gram Stain     Status: None (Preliminary result)  Collection Time: 07/12/24 11:09 AM   Specimen: PATH Cytology Pleural fluid  Result Value Ref Range Status   Specimen Description   Final    PLEURAL Performed at [facility], [address], [place], [place] [address]    Special Requests   Final    NONE Performed at [facility], [address]  [place], [place] [address]    Gram Stain NO WBC SEEN NO ORGANISMS SEEN   Final   Culture   Final    NO GROWTH < 24 HOURS Performed at [facility], [address] [place], [place] [address]    Report Status PENDING  Incomplete      Radiology Studies last 3 days: US  THORACENTESIS ASP PLEURAL SPACE W/IMG GUIDE Result Date: 07/13/2024 INDICATION: 83 year old male with history of dementia, generalized weakness, and chronic heart failure, now with bilateral pleural effusions, left greater than right. IR was requested for diagnostic and therapeutic left-sided thoracentesis. EXAM: ULTRASOUND GUIDED DIAGNOSTIC AND THERAPEUTIC THORACENTESIS MEDICATIONS: 4 cc of 1% lidocaine. COMPLICATIONS: None immediate. PROCEDURE: An ultrasound guided thoracentesis was thoroughly discussed with the patient and questions answered. The benefits, risks, alternatives and complications were also discussed. The patient understands and wishes to proceed with the procedure. Written consent was obtained. Ultrasound was performed to localize and mark an adequate pocket of fluid in the left chest. The area was then prepped and draped in the normal sterile fashion. 1% Lidocaine was used for local anesthesia. Under ultrasound guidance a 6 Fr Safe-T-Centesis catheter was introduced. Thoracentesis was performed. The catheter was removed and a dressing applied. FINDINGS: A total of approximately 400 mL of clear, straw-colored pleural fluid was removed. Samples were sent to the laboratory as requested by the clinical team. IMPRESSION: Successful ultrasound guided left thoracentesis yielding 400 mL of pleural fluid. Procedure performed by Carlin Griffon, PA-C Electronically Signed   By: Cordella Banner   On: 07/13/2024 11:27   DG Chest Port 1 View Result Date: 07/12/2024 CLINICAL DATA:  Status post left thoracentesis EXAM: PORTABLE CHEST 1 VIEW COMPARISON:  Earlier same day CT chest, chest radiograph dated 07/10/2024 FINDINGS: Mildly  low lung volumes. Diffuse interlobular septal thickening. Persistent dense left retrocardiac opacity, likely atelectasis. Decreased small left pleural effusion. Skin fold along the lateral pleura. Similar layering moderate to large right pleural effusion. No pneumothorax. Similar enlarged cardiomediastinal silhouette. No acute osseous abnormality. IMPRESSION: 1. Decreased small left pleural effusion status post thoracentesis. No pneumothorax. 2. Similar layering moderate to large right pleural effusion. 3. Pulmonary edema. Electronically Signed   By: Limin  Xu M.D.   On: 07/12/2024 11:53   CT CHEST W CONTRAST Result Date: 07/12/2024 CLINICAL DATA:  Generalized weakness and hypoxia EXAM: CT CHEST WITH CONTRAST TECHNIQUE: Multidetector CT imaging of the chest was performed during intravenous contrast administration. RADIATION DOSE REDUCTION: This exam was performed according to the departmental dose-optimization program which includes automated exposure control, adjustment of the mA and/or kV according to patient size and/or use of iterative reconstruction technique. CONTRAST:  80mL OMNIPAQUE  IOHEXOL  300 MG/ML  SOLN COMPARISON:  Chest radiograph dated 07/10/2024, CT chest dated 12/18/2023 FINDINGS: Cardiovascular: Mild multichamber cardiomegaly. No significant pericardial fluid/thickening. Great vessels are normal in course and caliber. No central pulmonary emboli. Coronary artery calcifications and aortic atherosclerosis. Mediastinum/Nodes: Imaged thyroid  gland without nodules meeting criteria for imaging follow-up by size. Normal esophagus. No pathologically enlarged axillary, supraclavicular, mediastinal, or hilar lymph nodes. Lungs/Pleura: The central airways are patent. Secretions within the distal bronchus extending  into the right bronchus intermedius. Mild diffuse bronchial wall thickening. Moderate diffuse interlobular septal thickening. Relaxation atelectasis of the bilateral lower lobes. No pneumothorax.  Large bilateral pleural effusions. Upper abdomen: Partially imaged small volume ascites. Musculoskeletal: No acute or abnormal lytic or blastic osseous lesions. Multilevel degenerative changes of the thoracic spine. Diffuse body wall edema. IMPRESSION: 1. Pulmonary edema and large bilateral pleural effusions. 2. Mild multichamber cardiomegaly. 3. Partially imaged small volume ascites. 4.  Aortic Atherosclerosis (ICD10-I70.0). Electronically Signed   By: Limin  Xu M.D.   On: 07/12/2024 11:08   DG Chest 1 View Result Date: 07/10/2024 EXAM: 1 VIEW(S) XRAY OF THE CHEST 07/10/2024 03:54:00 AM COMPARISON: 07/04/2024 CLINICAL HISTORY: Dyspnea FINDINGS: LUNGS AND PLEURA: There are low lung volumes with hazy opacification of both lung bases and obscuration of the hemidiaphragms. No pleural effusion. No pneumothorax. HEART AND MEDIASTINUM: There is calcification within the aortic arch. No acute abnormality of the cardiac and mediastinal silhouettes. BONES AND SOFT TISSUES: No acute osseous abnormality. IMPRESSION: 1. Low lung volumes with hazy opacification of both lung bases and obscuration of the hemidiaphragms, likely secondary to a combination of pleural effusions and atelectasis. Electronically signed by: Evalene Coho MD 07/10/2024 04:04 AM EST RP Workstation: DARYLENE Laneta Blunt, DO [facility] 07/13/2024, 12:35 PM    Dictation software may have been used to generate the above note. Typos may occur and escape review in typed/dictated notes. Please contact Dr Blunt directly for clarity if needed.  Staff may message me via secure chat in Epic  but this may not receive an immediate response,  please page me for urgent matters!  If 7PM-7AM, please contact night coverage [email or web address]

## 2024-07-13 NOTE — Plan of Care (Signed)

## 2024-07-13 NOTE — Progress Notes (Signed)
 Daily Progress Note   Patient Name: Luke Chen       Date: 07/13/2024 DOB: 03/29/1941  Age: 83 y.o. MRN#: [id number] Attending Physician: Marsa Edelman, DO Primary Care Physician: CONCHA Sheralyn Flock, MD Admit Date: 07/04/2024  Reason for Consultation/Follow-up: Establishing goals of care  HPI/Brief Hospital Review: 83 y.o. male with past medical history of dementia, DM- insulin  dependent, HTN, stroke, CHF, CAD, iron deficiency anemia on iron infusions, admitted on 07/04/2024 with generalized weakness. Workup revealed dehydration, UTI, and DKA. Palliative medicine consulted for goals of care.   Subjective: Extensive chart review has been completed prior to meeting patient including labs, vital signs, imaging, progress notes, orders, and available advanced directive documents from current and previous encounters.    Visited with Mr. Savidge at his bedside. He is resting in bed with eyes closed.  He attempts to open his eyes briefly with the calling of his name, he is able to intermittently answer simple yes or no questions appropriately.  Wife at bedside shares Mr. Mathe was awake and alert earlier in the morning and able to hold his cup of coffee and tolerate a few bites of toast.  Safety sitter remains at bedside explains brief periods of wakefulness with Mr. Sylve being able to answer simple yes or no questions appropriately but unable to hold an ongoing meaningful conversation.  Discussions had with wife-[name] at bedside in collaboration with Dr. Marsa and conversations continued independently.  We discussed brief overview of hospitalization as well as most recent medical updates.  We discussed Mr. Hillmer was able to tolerate thoracentesis yesterday with removal of 400 cc of fluid.  We  discussed the poor function of his heart due to HFrEF.  Concern also expressed related to his poor cognition without significant improvement over the course of his hospitalization likely related to his underlying dementia exacerbated by acute illness.  [name] remains persistent and that she feels medications administered for agitation have contributed to Mr. Bown setback despite detailed conversations had explaining his underlying HFrEF is unrelated to medications administered.  [name] speaks to her previous health issues she has experienced and her ability to overcome similar situations although it is noted and discussed with [name] these experiences were over 20 years ago.  We discussed the aging process and end-of-life expectations.  [name] confirms DNR/DNI status but remains  hopeful for recovery.  Continue to treat the treatable.  Show and with plans to reach out to Mr. Ballow primary cardiologist at [facility] tomorrow to further discuss options.  Answered and addressed all questions and concerns.  PMT to continue to follow for ongoing needs and support.  Objective:  Physical Exam Constitutional:      General: He is not in acute distress.    Appearance: He is cachectic. He is ill-appearing.     Comments: Frail  HENT:     Head: Normocephalic.     Mouth/Throat:     Mouth: Mucous membranes are dry.  Pulmonary:     Effort: Pulmonary effort is normal. No respiratory distress.  Abdominal:     General: There is no distension.     Palpations: Abdomen is soft.     Tenderness: There is no abdominal tenderness.  Skin:    General: Skin is warm and dry.     Findings: Bruising present.  Neurological:     Mental Status: He is lethargic.     Motor: Weakness present.     Comments: Oriented to self             Vital Signs: BP 103/78 (BP Location: Right Arm)   Pulse 82   Temp (!) 97.5 F (36.4 C) (Oral)   Resp 16   Ht 5' 9 (1.753 m)   Wt 63.2 kg   SpO2 98%   BMI 20.59 kg/m  SpO2: SpO2: 98  % O2 Device: O2 Device: High Flow Nasal Cannula O2 Flow Rate: O2 Flow Rate (L/min): 3 L/min   Palliative Care Assessment & Plan   Assessment/Recommendation/Plan  Overall poor prognosis Continue current plan of care  Care plan was discussed with primary team.  Thank you for allowing the Palliative Medicine Team to assist in the care of this patient.  Visit includes: Detailed review of medical records (labs, imaging, vital signs), medically appropriate exam (mental status, respiratory, cardiac, skin), discussed with treatment team, counseling and educating patient, family and staff, documenting clinical information, medication management and coordination of care.  Waddell Lesches, DNP, AGNP-C Palliative Medicine   Please contact Palliative Medicine Team phone at [phone] for questions and concerns.

## 2024-07-14 ENCOUNTER — Inpatient Hospital Stay

## 2024-07-14 DIAGNOSIS — R531 Weakness: Secondary | ICD-10-CM | POA: Diagnosis not present

## 2024-07-14 LAB — COMPREHENSIVE METABOLIC PANEL WITH GFR
ALT: 42 U/L (ref 0–44)
AST: 38 U/L (ref 15–41)
Albumin: 3.5 g/dL (ref 3.5–5.0)
Alkaline Phosphatase: 165 U/L — ABNORMAL HIGH (ref 38–126)
Anion gap: 14 (ref 5–15)
BUN: 32 mg/dL — ABNORMAL HIGH (ref 8–23)
CO2: 24 mmol/L (ref 22–32)
Calcium: 9.2 mg/dL (ref 8.9–10.3)
Chloride: 106 mmol/L (ref 98–111)
Creatinine, Ser: 1.15 mg/dL (ref 0.61–1.24)
GFR, Estimated: >60 mL/min (ref >60)
Glucose, Bld: 176 mg/dL — ABNORMAL HIGH (ref 70–99)
Potassium: 3.8 mmol/L (ref 3.5–5.1)
Sodium: 144 mmol/L (ref 135–145)
Total Bilirubin: 0.6 mg/dL (ref 0.0–1.2)
Total Protein: 6.2 g/dL — ABNORMAL LOW (ref 6.5–8.1)

## 2024-07-14 LAB — PATHOLOGIST SMEAR REVIEW

## 2024-07-14 LAB — CBC
HCT: 37.5 % — ABNORMAL LOW (ref 39.0–52.0)
Hemoglobin: 12 g/dL — ABNORMAL LOW (ref 13.0–17.0)
MCH: 29.5 pg (ref 26.0–34.0)
MCHC: 32 g/dL (ref 30.0–36.0)
MCV: 92.1 fL (ref 80.0–100.0)
Platelets: 183 K/uL (ref 150–400)
RBC: 4.07 MIL/uL — ABNORMAL LOW (ref 4.22–5.81)
RDW: 23.7 % — ABNORMAL HIGH (ref 11.5–15.5)
WBC: 7.5 K/uL (ref 4.0–10.5)
nRBC: 0 % (ref 0.0–0.2)

## 2024-07-14 LAB — GLUCOSE, CAPILLARY
Glucose-Capillary: 181 mg/dL — ABNORMAL HIGH (ref 70–99)
Glucose-Capillary: 171 mg/dL — ABNORMAL HIGH (ref 70–99)
Glucose-Capillary: 227 mg/dL — ABNORMAL HIGH (ref 70–99)
Glucose-Capillary: 223 mg/dL — ABNORMAL HIGH (ref 70–99)
Glucose-Capillary: 196 mg/dL — ABNORMAL HIGH (ref 70–99)

## 2024-07-14 LAB — HEMOGLOBIN A1C
Hgb A1c MFr Bld: 9.2 % — ABNORMAL HIGH (ref 4.8–5.6)
Mean Plasma Glucose: 217 mg/dL

## 2024-07-14 NOTE — Progress Notes (Signed)
 PROGRESS NOTE    Luke Chen   FMW:[id number] DOB: 19-Aug-1940  DOA: 07/04/2024 Date of Service: 07/14/24 which is hospital day 9  PCP: P.A., Sheralyn Flock, MD    Hospital course / significant events:   Luke Chen is a 83 y.o. 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   HPI: Wife at the bedside who gives a history, states that he had been declining for some time, sleeping for most of the day. States the iron infusions have not been helping him as expected. Due to increased concern she called EMS to bring him in.   11/21: to ED. Started insulin  drip.  11/22: admitted to hospitalist service, hyperglycemia, hypotension, AKI, failure to thrive. Continue abx, cautious hydration. Got Haldol  x1 for sundowning.  11/23: hypotensive, adjusting cardiac meds. PT/OT recs for LTC 11/24:  wife has goal for pt to go abck home and not LTC. Palliative care consulted 11/25: palliative care unable to reach pt's wife.  11/26: palliative care met w/ patient's wife. On rounds, I also discussed goals / options. Wife is back and forth - TOC following, see A/P. Pt has received 5 days abx, UCx mult species, will give last dose abx today 11/27: unable to reach pts wife. Clinically no change but pt appears comfortable. last dose Haldol  was overnight 11/27 00:52 11/28: d/w wife at bedside, dispo still in process, see below, pt not improving. PT/OT to try to see this afternoon while wife is present --> see IPAL note. Confirm for DNR which is appropriate. Otherwise treat as able, will work on SNF rehab placement  11/29: hypoxic overnight, lasix ordered. This AM CT Chest (+)effusion --> thora. Continue lasix. Palliative care saw pt today and d/w wife.  11/30: 3.2L UOP yesterday, mild AKI now, will get 1  dose lasix this AM and continue monitor output. Pt appears comfortable. See A/P for GOC discussion  12/01: Pt remains minimally alert, he is eating some w/ encouragement. [facility] denied transfer. Repeat CXR clear. Cardiology consult - nothing further to offer at this time. Plan for now to ensure medical stability over next 1-2 days, if he remains stable Mrs. Trim will have to decide regarding SNF, TOC following      Consultants:  Palliative care  Interventional Radiology  Procedures/Surgeries: L Thoracentesis 07/12/24 removal 400 cc      ASSESSMENT & PLAN:   Generalized weakness Possible urinary tract infection Dehydration/hypotension Suspect multifactorial, including dehydration, possible underlying infection, hyperglycemia, with background of anemia and frailty  Troponin elevated but downtrended so low suspicion for ACS Urinalysis shows possible UTI completed ceftriaxone  Fall and aspiration precautions PT OT consulted and recommending SNF - see below    Acute hypoxic respiratory failure d/t pleural effusion - improved s/p thoracentesis  Underlying COPD but not on home O2 OF NOTE - pt's wife has declined chest tube if there is a complication of severe PTX/collapse. She has declined any sedating medications, if pt is not tolerating the procedure then will abort the procedure.   Chronic HFrEF (heart failure with reduced ejection fraction) with acute exacerbation Echo last month at [facility] EF 20 Have had to hold GDMT meds due to soft blood pressure Monitor input and output Lasix per cardiology - held today w/ clear CXR  Cardiology consulted - not much more to offer  at this point, high risk patient   Dementia with behavioral disturbance I have discussed w/ Mrs. Rodeheaver that he is fitting the clinical diagnosis of worsening vascular dementia. I affirmed that an abrupt change like this can be very difficult to witness and to manage. Mrs. Manasco believes that sedating medication is cause  for his problems now. Note: last dose Haldol  was 11/27 00:52, >72h ago. I have affirmed that sedating medication can certainly alter a patient temporarily but these medications are not affecting him now. I have been firm that I will support use of these medications if there is a need to use them for his safety or for the staff's safety. We will try to not use meds but I have been clear that I do not think such medications are the main factor in his decline, but rather the dementia is the problem in addition to the DKA/AKI, CHF, poor po intake  Delirium precautions Continue Aricept  Fall and aspiration precautions One-to-one precaution  Haldol  prn - please limit use to absolute necessity for safety and then at lowest effective dose Palliative also following   Hyperglycemia  Hypoglycemia d/t poor po intake  Patient has been weaned off insulin  drip SSI insulin  q4h was held d/t hypoglycemia now reinitiated today d/t Glc 200+ without substantial po intake  D50 prn    Elevated troponin CAD s/p stent to LCx and LAD Suspect demand ischemia.  Troponin 143-> 128 Recheck as needed   AKI (acute kidney injury)-resolved Creatinine 1.36 up from baseline of 0.9 on 10/7 Renal function back to baseline Monitor renal function and avoid nephrotoxins    Chronic anticoagulation History of LV thrombus apixiban    IDA (iron deficiency anemia) On iron transfusion, last transfusion 11/17 Monitor CBC Hemoglobin fairly stable   Essential hypertension Holding antihypertensives due to soft blood pressure and also he is getting lasix periodically   Advanced care planning See previous notes for daily conversations  12/01: Mrs. Wenke states she spoke w/ patient's cardiology office this AM and someone said he could transfer if we call the transfer line - she did not speak to a physician. I explained how transfer process works and explained she should NOT have been made any guarantees of transfer, a physician has  to accept the patient at [facility]. I offered to initiate transfer process and we can see if he is accepted. She states if he isn't accepted she will take him out of here AMA. I called transfer center at [facility] approx 11:00, they reviewed case, around 15:00 [facility] confirmed denial of transfer. I returned to bedside w/ Jacques MATSU (pt's RN) and Melissa S (TOC), explained to Mrs. Wood that transfer was denied. I reviewed options 1) continue care here w/ plan for placement, I expect he will be medically optimized within the next few days pending further developments, or 2) she is free to sign him out AMA as she stated this morning, but I do not recommend this. She asks about if at rehab he can go to [facility] from there is he's sick, I stated EMS is obligated to take patients to nearest medical facility so only way I could even try to accommodate that would be arranging placement at SNF close to [facility] so he'd go there if EMS needed to take him. TOC present for the conversation and agreeable to explore options.     No concerns based on BMI: Body mass index is 20.59 kg/m.SABRA Significantly low or high BMI is associated with higher medical risk.  Underweight - under 18  overweight - 25 to 29 obese - 30 or more Class 1 obesity: BMI of 30.0 to 34 Class 2 obesity: BMI of 35.0 to 39 Class 3 obesity: BMI of 40.0 to 49 Super Morbid Obesity: BMI 50-59 Super-super Morbid Obesity: BMI 60+ Healthy nutrition and physical activity advised as adjunct to other disease management and risk reduction treatments    DVT prophylaxis: eliquis  - holding for now  IV fluids: no continuous IV fluids  Nutrition: as tolerated Central lines / other devices: none  Code Status: DNR ACP documentation reviewed: none on file in [facility]  TOC needs: placement Medical barriers to dispo: monitoring fluid status CHF but expect medically optimized soon.             Subjective / Brief ROS:  Patient not contributory He awakens to voice but  does not verbalize  He appears comfortable  Family Communication: wife at bedside - see A/P for discussion     Objective Findings:  Vitals:   07/14/24 0436 07/14/24 0753 07/14/24 1152 07/14/24 1605  BP: (!) 86/42 (!) 92/58 (!) 104/57 113/73  Pulse: 72 77 73 85  Resp:  16 15 15   Temp: 97.6 F (36.4 C) 97.7 F (36.5 C) 97.6 F (36.4 C) 99.1 F (37.3 C)  TempSrc:  Oral Oral   SpO2: (!) 77% 100% 99% 92%  Weight:      Height:        Intake/Output Summary (Last 24 hours) at 07/14/2024 1646 Last data filed at 07/14/2024 0900 Gross per 24 hour  Intake 240 ml  Output 350 ml  Net -110 ml   Filed Weights   07/04/24 2347 07/05/24 2000 07/06/24 0326  Weight: 54.4 kg 63.9 kg 63.2 kg    Examination:  Physical Exam Constitutional:      General: He is not in acute distress. Cardiovascular:     Rate and Rhythm: Normal rate and regular rhythm.  Pulmonary:     Effort: No respiratory distress.     Breath sounds: Normal breath sounds.     Comments: Diminshed breath sounds but clear today and better air movement than previously Abdominal:     Palpations: Abdomen is soft.  Musculoskeletal:     Right lower leg: No edema.     Left lower leg: No edema.  Skin:    General: Skin is warm and dry.     Coloration: Skin is pale.  Neurological:     Mental Status: He is disoriented.  Psychiatric:        Behavior: Behavior normal.          Scheduled Medications:   acetaminophen   650 mg Oral TID   apixaban   5 mg Oral BID   atorvastatin   40 mg Oral Daily   dextrose   1 ampule Intravenous Once   donepezil   10 mg Oral Daily   feeding supplement  237 mL Oral BID BM   insulin  aspart  0-9 Units Subcutaneous Q4H    morphine injection  2 mg Intravenous Once    Continuous Infusions:    PRN Medications:  acetaminophen  **OR** acetaminophen , dextrose , glucagon (human recombinant), haloperidol  lactate, ipratropium-albuterol , ondansetron  **OR** ondansetron  (ZOFRAN ) IV, mouth  rinse  Antimicrobials from admission:  Anti-infectives (From admission, onward)    Start     Dose/Rate Route Frequency Ordered Stop   07/05/24 1730  cefTRIAXone  (ROCEPHIN ) 1 g in sodium chloride  0.9 % 100 mL IVPB  Status:  Discontinued        1  g 200 mL/hr over 30 Minutes Intravenous Every 24 hours 07/05/24 1622 07/10/24 0835           Data Reviewed:  I have personally reviewed the following...  CBC: Recent Labs  Lab 07/08/24 0524 07/09/24 0526 07/11/24 1010 07/12/24 1011 07/13/24 0929 07/14/24 0530  WBC 11.3* 9.4 7.1 8.4 9.5 7.5  NEUTROABS 7.9* 5.7  --  5.5  --   --   HGB 10.6* 10.6* 10.6* 11.9* 12.6* 12.0*  HCT 32.9* 32.2* 33.5* 37.2* 39.3 37.5*  MCV 91.6 88.7 92.5 93.2 92.9 92.1  PLT 224 204 191 185 198 183   Basic Metabolic Panel: Recent Labs  Lab 07/09/24 0526 07/11/24 1010 07/12/24 1011 07/13/24 0929 07/14/24 0530  NA 140 141 142 141 144  K 4.1 4.9 4.9 4.6 3.8  CL 108 107 107 104 106  CO2 19* 18* 23 23 24   GLUCOSE 87 249* 193* 306* 176*  BUN 46* 40* 39* 36* 32*  CREATININE 1.07 1.22 1.16 1.30* 1.15  CALCIUM  9.0 9.2 9.4 9.5 9.2   GFR: Estimated Creatinine Clearance: 45 mL/min (by C-G formula based on SCr of 1.15 mg/dL). Liver Function Tests: Recent Labs  Lab 07/12/24 1011 07/14/24 0530  AST 47* 38  ALT 58* 42  ALKPHOS 195* 165*  BILITOT 0.6 0.6  PROT 6.4* 6.2*  ALBUMIN 3.7 3.5   No results for input(s): LIPASE, AMYLASE in the last 168 hours. No results for input(s): AMMONIA in the last 168 hours. Coagulation Profile: No results for input(s): INR, PROTIME in the last 168 hours. Cardiac Enzymes: No results for input(s): CKTOTAL, CKMB, CKMBINDEX, TROPONINI in the last 168 hours.  BNP (last 3 results) No results for input(s): PROBNP in the last 8760 hours. HbA1C: Recent Labs    07/13/24 0929  HGBA1C 9.2*   CBG: Recent Labs  Lab 07/13/24 2322 07/14/24 0438 07/14/24 0757 07/14/24 1158 07/14/24 1605  GLUCAP  168* 181* 171* 227* 223*   Lipid Profile: No results for input(s): CHOL, HDL, LDLCALC, TRIG, CHOLHDL, LDLDIRECT in the last 72 hours. Thyroid  Function Tests: No results for input(s): TSH, T4TOTAL, FREET4, T3FREE, THYROIDAB in the last 72 hours. Anemia Panel: No results for input(s): VITAMINB12, FOLATE, FERRITIN, TIBC, IRON, RETICCTPCT in the last 72 hours. Most Recent Urinalysis On File:     Component Value Date/Time   COLORURINE AMBER (A) 07/05/2024 0441   APPEARANCEUR HAZY (A) 07/05/2024 0441   LABSPEC 1.020 07/05/2024 0441   PHURINE 5.0 07/05/2024 0441   GLUCOSEU >=500 (A) 07/05/2024 0441   HGBUR MODERATE (A) 07/05/2024 0441   BILIRUBINUR NEGATIVE 07/05/2024 0441   BILIRUBINUR neg 01/02/2022 1407   KETONESUR NEGATIVE 07/05/2024 0441   PROTEINUR 100 (A) 07/05/2024 0441   UROBILINOGEN negative (A) 01/02/2022 1407   NITRITE NEGATIVE 07/05/2024 0441   LEUKOCYTESUR TRACE (A) 07/05/2024 0441   Sepsis Labs: @LABRCNTIP (procalcitonin:4,lacticidven:4) Microbiology: Recent Results (from the past 240 hours)  MRSA Next Gen by PCR, Nasal     Status: None   Collection Time: 07/05/24  2:00 AM   Specimen: Nasal Mucosa; Nasal Swab  Result Value Ref Range Status   MRSA by PCR Next Gen NOT DETECTED NOT DETECTED Final    Comment: (NOTE) The GeneXpert MRSA Assay (FDA approved for NASAL specimens only), is one component of a comprehensive MRSA colonization surveillance program. It is not intended to diagnose MRSA infection nor to guide or monitor treatment for MRSA infections. Test performance is not FDA approved in patients less than 87 years old. Performed at  [facility], [address] [place], [place] [address]   Urine Culture     Status: Abnormal   Collection Time: 07/05/24  4:41 AM   Specimen: Urine, Random  Result Value Ref Range Status   Specimen Description   Final    URINE, RANDOM Performed at [facility], [address], [place], [place] [address]    Special Requests   Final    NONE Reflexed from [id number] Performed at [facility], [address] [place], [place] [address]    Culture MULTIPLE SPECIES PRESENT, SUGGEST RECOLLECTION (A)  Final   Report Status 07/06/2024 FINAL  Final  Body fluid culture w Gram Stain     Status: None (Preliminary result)   Collection Time: 07/12/24 11:09 AM   Specimen: PATH Cytology Pleural fluid  Result Value Ref Range Status   Specimen Description   Final    PLEURAL Performed at [facility], [address] [place], [place] [address]    Special Requests   Final    NONE Performed at [facility], [address] [place], [place] [address]    Gram Stain NO WBC SEEN NO ORGANISMS SEEN   Final   Culture   Final    NO GROWTH 2 DAYS Performed at [facility], [address], [place], [place] [address]    Report Status PENDING  Incomplete      Radiology Studies last 3 days: DG Chest Port 1 View Result Date: 07/14/2024 CLINICAL DATA:  Follow-up pleural effusion.  Hyperglycemia. EXAM: PORTABLE CHEST 1 VIEW COMPARISON:  07/12/2024 FINDINGS: No significant change in mild enlargement of the cardiac silhouette. Small right pleural effusion with improvement with decreased right basilar atelectasis. No left pleural effusion seen. Resolved left basilar atelectasis. No pneumothorax. Thoracic spine degenerative changes. IMPRESSION: 1. Small right pleural effusion with improvement and decreased right basilar atelectasis. 2. Resolved left pleural effusion and left basilar atelectasis. Electronically Signed   By: Elspeth Bathe M.D.   On: 07/14/2024 14:17   US  THORACENTESIS ASP PLEURAL SPACE W/IMG GUIDE Result Date: 07/13/2024 INDICATION: 83 year old male with history of dementia, generalized weakness, and chronic heart failure, now with bilateral pleural effusions, left greater than right. IR was requested for diagnostic and therapeutic left-sided  thoracentesis. EXAM: ULTRASOUND GUIDED DIAGNOSTIC AND THERAPEUTIC THORACENTESIS MEDICATIONS: 4 cc of 1% lidocaine. COMPLICATIONS: None immediate. PROCEDURE: An ultrasound guided thoracentesis was thoroughly discussed with the patient and questions answered. The benefits, risks, alternatives and complications were also discussed. The patient understands and wishes to proceed with the procedure. Written consent was obtained. Ultrasound was performed to localize and mark an adequate pocket of fluid in the left chest. The area was then prepped and draped in the normal sterile fashion. 1% Lidocaine was used for local anesthesia. Under ultrasound guidance a 6 Fr Safe-T-Centesis catheter was introduced. Thoracentesis was performed. The catheter was removed and a dressing applied. FINDINGS: A total of approximately 400 mL of clear, straw-colored pleural fluid was removed. Samples were sent to the laboratory as requested by the clinical team. IMPRESSION: Successful ultrasound guided left thoracentesis yielding 400 mL of pleural fluid. Procedure performed by Carlin Griffon, PA-C Electronically Signed   By: Cordella Banner   On: 07/13/2024 11:27   DG Chest Port 1 View Result Date: 07/12/2024 CLINICAL DATA:  Status post left thoracentesis EXAM: PORTABLE CHEST 1 VIEW COMPARISON:  Earlier same day CT chest, chest radiograph dated 07/10/2024 FINDINGS: Mildly low lung volumes. Diffuse interlobular septal thickening.  Persistent dense left retrocardiac opacity, likely atelectasis. Decreased small left pleural effusion. Skin fold along the lateral pleura. Similar layering moderate to large right pleural effusion. No pneumothorax. Similar enlarged cardiomediastinal silhouette. No acute osseous abnormality. IMPRESSION: 1. Decreased small left pleural effusion status post thoracentesis. No pneumothorax. 2. Similar layering moderate to large right pleural effusion. 3. Pulmonary edema. Electronically Signed   By: Limin  Xu M.D.   On:  07/12/2024 11:53   CT CHEST W CONTRAST Result Date: 07/12/2024 CLINICAL DATA:  Generalized weakness and hypoxia EXAM: CT CHEST WITH CONTRAST TECHNIQUE: Multidetector CT imaging of the chest was performed during intravenous contrast administration. RADIATION DOSE REDUCTION: This exam was performed according to the departmental dose-optimization program which includes automated exposure control, adjustment of the mA and/or kV according to patient size and/or use of iterative reconstruction technique. CONTRAST:  80mL OMNIPAQUE  IOHEXOL  300 MG/ML  SOLN COMPARISON:  Chest radiograph dated 07/10/2024, CT chest dated 12/18/2023 FINDINGS: Cardiovascular: Mild multichamber cardiomegaly. No significant pericardial fluid/thickening. Great vessels are normal in course and caliber. No central pulmonary emboli. Coronary artery calcifications and aortic atherosclerosis. Mediastinum/Nodes: Imaged thyroid  gland without nodules meeting criteria for imaging follow-up by size. Normal esophagus. No pathologically enlarged axillary, supraclavicular, mediastinal, or hilar lymph nodes. Lungs/Pleura: The central airways are patent. Secretions within the distal bronchus extending into the right bronchus intermedius. Mild diffuse bronchial wall thickening. Moderate diffuse interlobular septal thickening. Relaxation atelectasis of the bilateral lower lobes. No pneumothorax. Large bilateral pleural effusions. Upper abdomen: Partially imaged small volume ascites. Musculoskeletal: No acute or abnormal lytic or blastic osseous lesions. Multilevel degenerative changes of the thoracic spine. Diffuse body wall edema. IMPRESSION: 1. Pulmonary edema and large bilateral pleural effusions. 2. Mild multichamber cardiomegaly. 3. Partially imaged small volume ascites. 4.  Aortic Atherosclerosis (ICD10-I70.0). Electronically Signed   By: Limin  Xu M.D.   On: 07/12/2024 11:08           Bryce Kimble, DO [facility] 07/14/2024, 4:46 PM     Dictation software may have been used to generate the above note. Typos may occur and escape review in typed/dictated notes. Please contact Dr Marsa directly for clarity if needed.  Staff may message me via secure chat in Epic  but this may not receive an immediate response,  please page me for urgent matters!  If 7PM-7AM, please contact night coverage [email or web address]

## 2024-07-14 NOTE — Plan of Care (Signed)

## 2024-07-14 NOTE — Progress Notes (Signed)
 Occupational Therapy Treatment Patient Details Name: Luke Chen MRN: [id number] DOB: Jan 01, 1941 Today's Date: 07/14/2024   History of present illness Luke Chen is an 83yoM who comes to [facility] on 11/21 via [facility], wife called EMS after pt felt weak and lie down on the floor. BG in 400s. 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   OT comments  Patient seen for OT/PT treatment on this date. Patient EOB with PT upon OT arrival; incontinent of BM; stood with max A x 2 with stedy while OT performed total A pericare. Transferred patient with stedy to recliner with max A x 2. Wife upset about patients care, attempted to redirect and provide therapeutic listening with limited change in spouses perception of events. Patients spouse requesting to be transferred to [facility], MD and TOC to discuss with patient. OT attempted to have patient engage in grooming tasks, required hand over hand A, fell asleep during grooming tasks. Patient requires further intervention as he is below baseline, he will need more therapy upon dc from hospital. Patient ended tx in recliner with sitter and family present.       If plan is discharge home, recommend the following:  Two people to help with walking and/or transfers;Two people to help with bathing/dressing/bathroom;Assistance with feeding;Assistance with cooking/housework;Direct supervision/assist for medications management;Direct supervision/assist for financial management;Assist for transportation;Help with stairs or ramp for entrance;Supervision due to cognitive status   Equipment Recommendations  Other (comment) (defer to next venue of care)    Recommendations for Other Services      Precautions / Restrictions Precautions Precautions: Fall Recall of Precautions/Restrictions: Impaired Restrictions Weight Bearing Restrictions Per Provider Order: No       Mobility Bed Mobility                    Transfers Overall transfer  level: Needs assistance Equipment used: Ambulation equipment used Transfers: Sit to/from Stand Sit to Stand: Max assist, +2 physical assistance, From elevated surface, +2 safety/equipment, Via lift equipment           General transfer comment: sit>stand from elevated EOB x 2 with max assist +2, instructed to place BUE on stedy but pt unable, requires assistance to place hands on stedy bar; bed>recliner with stedy Transfer via Lift Equipment: Stedy   Balance Overall balance assessment: Needs assistance Sitting-balance support: Feet supported, Bilateral upper extremity supported Sitting balance-Leahy Scale: Fair Sitting balance - Comments: CGA static sitting   Standing balance support: Bilateral upper extremity supported, Reliant on assistive device for balance, During functional activity Standing balance-Leahy Scale: Poor Standing balance comment: High fall risk                           ADL either performed or assessed with clinical judgement   ADL Overall ADL's : Needs assistance/impaired     Grooming: Wash/dry face;Sitting;Minimal assistance                       Toileting- Clothing Manipulation and Hygiene: Total assistance Toileting - Clothing Manipulation Details (indicate cue type and reason): dependent; incontinent of bowels in bed, stood with stedy x 2 and required total A to perform pericare     Functional mobility during ADLs: Total assistance General ADL Comments: total A x 2 for SPT with Stedy from EOB to recliner    Extremity/Trunk Assessment Upper Extremity Assessment Upper Extremity Assessment: Generalized weakness  Vision       Perception     Praxis     Communication Communication Communication: Impaired Factors Affecting Communication: Difficulty expressing self;Hearing impaired   Cognition Arousal: Alert Behavior During Therapy: Flat affect                                 Following commands:  Impaired Following commands impaired: Follows one step commands inconsistently      Cueing   Cueing Techniques: Verbal cues, Gestural cues, Tactile cues, Visual cues  Exercises      Shoulder Instructions       General Comments incontinent of BM at start of tx    Pertinent Vitals/ Pain       Pain Assessment Pain Assessment: PAINAD Breathing: normal Negative Vocalization: none Facial Expression: smiling or inexpressive Body Language: relaxed Consolability: no need to console PAINAD Score: 0  Home Living                                          Prior Functioning/Environment              Frequency  Min 2X/week        Progress Toward Goals  OT Goals(current goals can now be found in the care plan section)  Progress towards OT goals: Progressing toward goals  Acute Rehab OT Goals Patient Stated Goal: none OT Goal Formulation: With family Time For Goal Achievement: 07/19/24 Potential to Achieve Goals: Fair ADL Goals Pt Will Perform Grooming: with min assist;sitting Pt Will Perform Lower Body Dressing: with min assist;sit to/from stand Pt Will Transfer to Toilet: with min assist;bedside commode  Plan      Co-evaluation    PT/OT/SLP Co-Evaluation/Treatment: Yes Reason for Co-Treatment: Complexity of the patient's impairments (multi-system involvement);For patient/therapist safety;To address functional/ADL transfers PT goals addressed during session: Mobility/safety with mobility;Balance;Proper use of DME;Strengthening/ROM OT goals addressed during session: ADL's and self-care      AM-PAC OT 6 Clicks Daily Activity     Outcome Measure   Help from another person eating meals?: A Lot Help from another person taking care of personal grooming?: Total Help from another person toileting, which includes using toliet, bedpan, or urinal?: Total Help from another person bathing (including washing, rinsing, drying)?: Total Help from another  person to put on and taking off regular upper body clothing?: Total Help from another person to put on and taking off regular lower body clothing?: Total 6 Click Score: 7    End of Session Equipment Utilized During Treatment: Gait belt  OT Visit Diagnosis: Unsteadiness on feet (R26.81);History of falling (Z91.81);Other symptoms and signs involving cognitive function   Activity Tolerance Patient limited by fatigue   Patient Left with nursing/sitter in room;in chair   Nurse Communication          Time: 8689-8665 OT Time Calculation (min): 24 min  Charges: OT General Charges $OT Visit: 1 Visit OT Treatments $Self Care/Home Management : 8-22 mins  Rogers Clause, OT/L MSOT, 07/14/2024

## 2024-07-14 NOTE — Progress Notes (Signed)
 Physical Therapy Treatment Patient Details Name: Luke Chen MRN: [id number] DOB: 1941/04/11 Today's Date: 07/14/2024   History of Present Illness Luke Chen is an 83yoM who comes to [facility] on 11/21 via [facility], wife called EMS after pt felt weak and lie down on the floor. BG in 400s. 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    PT Comments  Pt seen for PT evaluation with wife present. Wife expresses unhappiness re: various things re: situation, reports pt has not had any therapy while here with PT attempting to educate her that pt has had therapy but wife not very open. Pt is fatigued but agreeable to participate. Pt requires max assist for supine>sit, max assist +2 for sit<>stand from elevated EOB with stedy. Pt requires total assist for peri hygiene. Pt instructed to perform BLE LAQ but poor ability to return demo, requires AAROM. Pt left in recliner with wife present, OT still in room. Recommend ongoing PT services to progress mobility as able. Recommend post acute rehab <3 hours therapy/day upon d/c.    If plan is discharge home, recommend the following: Two people to help with bathing/dressing/bathroom;Two people to help with walking and/or transfers;Direct supervision/assist for medications management;Assistance with feeding;Direct supervision/assist for financial management;Assistance with cooking/housework;Supervision due to cognitive status;Help with stairs or ramp for entrance;Assist for transportation   Can travel by private vehicle     No  Equipment Recommendations  Wheelchair (measurements PT);Wheelchair cushion (measurements PT);Hospital bed;Hoyer lift    Recommendations for Other Services Rehab consult     Precautions / Restrictions Precautions Precautions: Fall Restrictions Weight Bearing Restrictions Per Provider Order: No     Mobility  Bed Mobility Overal bed mobility: Needs Assistance Bed Mobility: Supine to Sit     Supine to sit:  Max assist, HOB elevated, Used rails          Transfers Overall transfer level: Needs assistance Equipment used: Ambulation equipment used Transfers: Sit to/from Stand Sit to Stand: Max assist, +2 physical assistance, From elevated surface, +2 safety/equipment, Via lift equipment           General transfer comment: sit>stand from elevated EOB x 2 with max assist +2, instructed to place BUE on stedy but pt unable, requires assistance to place hands on stedy bar; bed>recliner with stedy Transfer via Lift Equipment: Stedy  Ambulation/Gait                   Stairs             Wheelchair Mobility     Tilt Bed    Modified Rankin (Stroke Patients Only)       Balance   Sitting-balance support: Feet supported, Bilateral upper extremity supported Sitting balance-Leahy Scale: Fair Sitting balance - Comments: CGA static sitting   Standing balance support: Bilateral upper extremity supported, Reliant on assistive device for balance, During functional activity Standing balance-Leahy Scale: Poor                              Communication Communication Communication: Impaired Factors Affecting Communication: Difficulty expressing self;Hearing impaired  Cognition Arousal: Alert Behavior During Therapy: Flat affect   PT - Cognitive impairments: History of cognitive impairments                       PT - Cognition Comments: verbalizes one word the entire session Following commands: Impaired Following commands impaired: Follows  one step commands inconsistently    Cueing Cueing Techniques: Verbal cues, Gestural cues, Tactile cues, Visual cues  Exercises      General Comments General comments (skin integrity, edema, etc.): incontinent BM, total assist for peri hygiene      Pertinent Vitals/Pain Pain Assessment Pain Assessment: PAINAD Breathing: normal Negative Vocalization: none Facial Expression: smiling or inexpressive Body  Language: relaxed Consolability: no need to console PAINAD Score: 0    Home Living                          Prior Function            PT Goals (current goals can now be found in the care plan section) Acute Rehab PT Goals Patient Stated Goal: pt's family wants pt to be able to DC home. PT Goal Formulation: Patient unable to participate in goal setting Progress towards PT goals: Progressing toward goals    Frequency    Min 1X/week      PT Plan      Co-evaluation PT/OT/SLP Co-Evaluation/Treatment: Yes Reason for Co-Treatment: Complexity of the patient's impairments (multi-system involvement);For patient/therapist safety;To address functional/ADL transfers PT goals addressed during session: Mobility/safety with mobility;Balance;Proper use of DME;Strengthening/ROM        AM-PAC PT 6 Clicks Mobility   Outcome Measure  Help needed turning from your back to your side while in a flat bed without using bedrails?: Total Help needed moving from lying on your back to sitting on the side of a flat bed without using bedrails?: Total Help needed moving to and from a bed to a chair (including a wheelchair)?: Total Help needed standing up from a chair using your arms (e.g., wheelchair or bedside chair)?: Total Help needed to walk in hospital room?: Total Help needed climbing 3-5 steps with a railing? : Total 6 Click Score: 6    End of Session Equipment Utilized During Treatment: Oxygen  Activity Tolerance: Patient limited by fatigue Patient left: in chair;with call bell/phone within reach;with family/visitor present;with nursing/sitter in room Nurse Communication: Mobility status PT Visit Diagnosis: Other abnormalities of gait and mobility (R26.89);Muscle weakness (generalized) (M62.81);Difficulty in walking, not elsewhere classified (R26.2)     Time: 8544-8471 PT Time Calculation (min) (ACUTE ONLY): 33 min  Charges:    $Therapeutic Activity: 8-22 mins PT  General Charges $$ ACUTE PT VISIT: 1 Visit                     Richerd Pinal, PT, DPT 07/14/24, 3:44 PM   Richerd CHRISTELLA Pinal 07/14/2024, 3:41 PM

## 2024-07-14 NOTE — Plan of Care (Signed)
 PMT following. Message received from primary team regarding goals and planning. As per message, PMT will hold on further visits and await further input from primary team prior to re-engaging with patient and family.

## 2024-07-14 NOTE — Consult Note (Signed)
 [facility] CLINIC CARDIOLOGY CONSULT NOTE       Patient ID: Luke Chen MRN: [id number] DOB/AGE: 1940/11/27 83 y.o.  Admit date: 07/04/2024 Referring Physician Dr. Laneta Blunt Primary Physician P.A., Sheralyn Flock, MD  Primary Cardiologist Dr. Samuel Cahill ([facility]) Reason for Consultation AoCHF  HPI: Luke Chen is a 83 y.o. 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 Treated for possible UTI as well as acute heart failure during admission, had L thoracentesis yesterday. Cardiology was consulted for further evaluation.   Patient initially brought to ED on 11/21 for concerns of elevated blood glucose and weakness. During admission was treated for hyperglycemia as well as generalized weakness, failure to thrive. Overnight on 11/29 was found to be hypoxic, CT chest concerning for pleural effusion and underwent thoracentesis. With continued O2 requirement over weekend. Has known hx of HFrEF with recent decline in LV function. Labs today notable for creatinine 1.15, potassium 3.8, hemoglobin 12.0, WBC 7.5. EKG in the ED NSR rate 92 bpm. CXR this AM with overall improvement.   History obtained via chart review and by discussion with patient's wife this AM. She reports that prior to admission he was not very active at home, could get around and dress himself but otherwise required assistance with other ADLs. Prior to coming in he was weaker and there was concern for elevated blood sugars. During admission he has been noted to have poor functional capacity, poor PO intake and has been confused with intermittent agitation. Per chart review it appears he has dementia at baseline. During my  evaluation he is resting comfortably, does not arouse to my presence or questions. No increased WOB on 2L [place].    Review of systems complete and found to be negative unless listed above    Past Medical History:  Diagnosis Date   Dementia (HCC)    Diabetes mellitus without complication (HCC)    Hypertension     History reviewed. No pertinent surgical history.  Medications Prior to Admission  Medication Sig Dispense Refill Last Dose/Taking   atorvastatin  (LIPITOR) 40 MG tablet Take 40 mg by mouth daily.   07/04/2024   clopidogrel  (PLAVIX ) 75 MG tablet Take 75 mg by mouth daily.   07/04/2024   donepezil  (ARICEPT ) 10 MG tablet TAKE 1 TABLET BY MOUTH EVERY DAY 90 tablet 2 07/04/2024   ELIQUIS  5 MG TABS tablet Take 5 mg by mouth 2 (two) times daily.   07/04/2024   glimepiride  (AMARYL ) 2 MG tablet Take 1 tablet (2 mg total) by mouth daily at 6 (six) AM. 90 tablet 1 07/04/2024   lisinopril  (ZESTRIL ) 5 MG tablet Take 1 tablet (5 mg total) by mouth daily. 90 tablet 3 07/04/2024   metFORMIN  (GLUCOPHAGE ) 1000 MG tablet TAKE 1 TABLET BY MOUTH TWICE A DAY 180 tablet 1 07/04/2024   metoprolol  succinate (TOPROL -XL) 25 MG 24 hr tablet Take 0.5 tablets by mouth daily.   07/04/2024   spironolactone  (ALDACTONE ) 25 MG tablet Take 12.5 mg by mouth daily.   07/04/2024   ascorbic acid (VITAMIN C) 1000 MG tablet Take 1,000 mg by mouth daily at 2 PM.      cyanocobalamin (VITAMIN B12) 1000 MCG tablet Take 1,000 mcg by mouth once a week.  Social History   Socioeconomic History   Marital status: Married    Spouse name: Not on file   Number of children: Not on file   Years of education: Not on file   Highest education level: Not on file  Occupational History   Not on file  Tobacco Use   Smoking status: Never   Smokeless tobacco: Never  Substance and Sexual Activity   Alcohol use: Never   Drug use: Never   Sexual activity: Not Currently  Other Topics Concern   Not on file  Social History Narrative    Not on file   Social Drivers of Health   Financial Resource Strain: Low Risk  (08/26/2021)   Overall Financial Resource Strain (CARDIA)    Difficulty of Paying Living Expenses: Not hard at all  Food Insecurity: No Food Insecurity (07/05/2024)   Hunger Vital Sign    Worried About Running Out of Food in the Last Year: Never true    Ran Out of Food in the Last Year: Never true  Transportation Needs: No Transportation Needs (07/05/2024)   PRAPARE - Administrator, Civil Service (Medical): No    Lack of Transportation (Non-Medical): No  Physical Activity: Inactive (08/26/2021)   Exercise Vital Sign    Days of Exercise per Week: 0 days    Minutes of Exercise per Session: 0 min  Stress: No Stress Concern Present (08/26/2021)   [employer] of Occupational Health - Occupational Stress Questionnaire    Feeling of Stress : Only a little  Social Connections: Moderately Integrated (07/05/2024)   Social Connection and Isolation Panel    Frequency of Communication with Friends and Family: Once a week    Frequency of Social Gatherings with Friends and Family: Once a week    Attends Religious Services: 1 to 4 times per year    Active Member of [employer] or Organizations: Yes    Attends Banker Meetings: 1 to 4 times per year    Marital Status: Married  Catering Manager Violence: Not At Risk (07/05/2024)   Humiliation, Afraid, Rape, and Kick questionnaire    Fear of Current or Ex-Partner: No    Emotionally Abused: No    Physically Abused: No    Sexually Abused: No    History reviewed. No pertinent family history.   Vitals:   07/13/24 2101 07/13/24 2339 07/14/24 0436 07/14/24 0753  BP: 103/69 (!) 105/59 (!) 86/42 (!) 92/58  Pulse: 90 80 72 77  Resp:    16  Temp: (!) 97.3 F (36.3 C) 97.7 F (36.5 C) 97.6 F (36.4 C) 97.7 F (36.5 C)  TempSrc: Oral Oral  Oral  SpO2: (!) 83% 93% (!) 77% 100%  Weight:      Height:        PHYSICAL EXAM General: Acute on  chronically ill appearing male, well nourished, in no acute distress. HEENT: Normocephalic and atraumatic. Neck: No JVD.  Lungs: Normal respiratory effort on 3L . Clear bilaterally to auscultation. No wheezes, crackles, rhonchi.  Heart: HRRR. Normal S1 and S2 without gallops or murmurs.  Abdomen: Non-distended appearing.  Msk: Normal strength and tone for age. Extremities: Warm and well perfused. No clubbing, cyanosis. No edema.  Neuro: Alert and oriented X 3. Psych: Answers questions appropriately.   Labs: Basic Metabolic Panel: Recent Labs    07/13/24 0929 07/14/24 0530  NA 141 144  K 4.6 3.8  CL 104 106  CO2 23 24  GLUCOSE 306* 176*  BUN  36* 32*  CREATININE 1.30* 1.15  CALCIUM  9.5 9.2   Liver Function Tests: Recent Labs    07/12/24 1011 07/14/24 0530  AST 47* 38  ALT 58* 42  ALKPHOS 195* 165*  BILITOT 0.6 0.6  PROT 6.4* 6.2*  ALBUMIN 3.7 3.5   No results for input(s): LIPASE, AMYLASE in the last 72 hours. CBC: Recent Labs    07/12/24 1011 07/13/24 0929 07/14/24 0530  WBC 8.4 9.5 7.5  NEUTROABS 5.5  --   --   HGB 11.9* 12.6* 12.0*  HCT 37.2* 39.3 37.5*  MCV 93.2 92.9 92.1  PLT 185 198 183   Cardiac Enzymes: No results for input(s): CKTOTAL, CKMB, CKMBINDEX, TROPONINIHS in the last 72 hours. BNP: No results for input(s): BNP in the last 72 hours. D-Dimer: No results for input(s): DDIMER in the last 72 hours. Hemoglobin A1C: Recent Labs    07/13/24 0929  HGBA1C 9.2*   Fasting Lipid Panel: No results for input(s): CHOL, HDL, LDLCALC, TRIG, CHOLHDL, LDLDIRECT in the last 72 hours. Thyroid  Function Tests: No results for input(s): TSH, T4TOTAL, T3FREE, THYROIDAB in the last 72 hours.  Invalid input(s): FREET3 Anemia Panel: No results for input(s): VITAMINB12, FOLATE, FERRITIN, TIBC, IRON, RETICCTPCT in the last 72 hours.   Radiology: US  THORACENTESIS ASP PLEURAL SPACE W/IMG GUIDE Result Date:  07/13/2024 INDICATION: 83 year old male with history of dementia, generalized weakness, and chronic heart failure, now with bilateral pleural effusions, left greater than right. IR was requested for diagnostic and therapeutic left-sided thoracentesis. EXAM: ULTRASOUND GUIDED DIAGNOSTIC AND THERAPEUTIC THORACENTESIS MEDICATIONS: 4 cc of 1% lidocaine. COMPLICATIONS: None immediate. PROCEDURE: An ultrasound guided thoracentesis was thoroughly discussed with the patient and questions answered. The benefits, risks, alternatives and complications were also discussed. The patient understands and wishes to proceed with the procedure. Written consent was obtained. Ultrasound was performed to localize and mark an adequate pocket of fluid in the left chest. The area was then prepped and draped in the normal sterile fashion. 1% Lidocaine was used for local anesthesia. Under ultrasound guidance a 6 Fr Safe-T-Centesis catheter was introduced. Thoracentesis was performed. The catheter was removed and a dressing applied. FINDINGS: A total of approximately 400 mL of clear, straw-colored pleural fluid was removed. Samples were sent to the laboratory as requested by the clinical team. IMPRESSION: Successful ultrasound guided left thoracentesis yielding 400 mL of pleural fluid. Procedure performed by Carlin Griffon, PA-C Electronically Signed   By: Cordella Banner   On: 07/13/2024 11:27   DG Chest Port 1 View Result Date: 07/12/2024 CLINICAL DATA:  Status post left thoracentesis EXAM: PORTABLE CHEST 1 VIEW COMPARISON:  Earlier same day CT chest, chest radiograph dated 07/10/2024 FINDINGS: Mildly low lung volumes. Diffuse interlobular septal thickening. Persistent dense left retrocardiac opacity, likely atelectasis. Decreased small left pleural effusion. Skin fold along the lateral pleura. Similar layering moderate to large right pleural effusion. No pneumothorax. Similar enlarged cardiomediastinal silhouette. No acute osseous  abnormality. IMPRESSION: 1. Decreased small left pleural effusion status post thoracentesis. No pneumothorax. 2. Similar layering moderate to large right pleural effusion. 3. Pulmonary edema. Electronically Signed   By: Limin  Xu M.D.   On: 07/12/2024 11:53   CT CHEST W CONTRAST Result Date: 07/12/2024 CLINICAL DATA:  Generalized weakness and hypoxia EXAM: CT CHEST WITH CONTRAST TECHNIQUE: Multidetector CT imaging of the chest was performed during intravenous contrast administration. RADIATION DOSE REDUCTION: This exam was performed according to the departmental dose-optimization program which includes automated exposure control, adjustment of the mA and/or  kV according to patient size and/or use of iterative reconstruction technique. CONTRAST:  80mL OMNIPAQUE  IOHEXOL  300 MG/ML  SOLN COMPARISON:  Chest radiograph dated 07/10/2024, CT chest dated 12/18/2023 FINDINGS: Cardiovascular: Mild multichamber cardiomegaly. No significant pericardial fluid/thickening. Great vessels are normal in course and caliber. No central pulmonary emboli. Coronary artery calcifications and aortic atherosclerosis. Mediastinum/Nodes: Imaged thyroid  gland without nodules meeting criteria for imaging follow-up by size. Normal esophagus. No pathologically enlarged axillary, supraclavicular, mediastinal, or hilar lymph nodes. Lungs/Pleura: The central airways are patent. Secretions within the distal bronchus extending into the right bronchus intermedius. Mild diffuse bronchial wall thickening. Moderate diffuse interlobular septal thickening. Relaxation atelectasis of the bilateral lower lobes. No pneumothorax. Large bilateral pleural effusions. Upper abdomen: Partially imaged small volume ascites. Musculoskeletal: No acute or abnormal lytic or blastic osseous lesions. Multilevel degenerative changes of the thoracic spine. Diffuse body wall edema. IMPRESSION: 1. Pulmonary edema and large bilateral pleural effusions. 2. Mild multichamber  cardiomegaly. 3. Partially imaged small volume ascites. 4.  Aortic Atherosclerosis (ICD10-I70.0). Electronically Signed   By: Limin  Xu M.D.   On: 07/12/2024 11:08   DG Chest 1 View Result Date: 07/10/2024 EXAM: 1 VIEW(S) XRAY OF THE CHEST 07/10/2024 03:54:00 AM COMPARISON: 07/04/2024 CLINICAL HISTORY: Dyspnea FINDINGS: LUNGS AND PLEURA: There are low lung volumes with hazy opacification of both lung bases and obscuration of the hemidiaphragms. No pleural effusion. No pneumothorax. HEART AND MEDIASTINUM: There is calcification within the aortic arch. No acute abnormality of the cardiac and mediastinal silhouettes. BONES AND SOFT TISSUES: No acute osseous abnormality. IMPRESSION: 1. Low lung volumes with hazy opacification of both lung bases and obscuration of the hemidiaphragms, likely secondary to a combination of pleural effusions and atelectasis. Electronically signed by: Evalene Coho MD 07/10/2024 04:04 AM EST RP Workstation: [id number]   DG Ankle 2 Views Left Result Date: 07/06/2024 EXAM: 2 VIEW(S) XRAY OF THE LEFT ANKLE 07/06/2024 01:53:00 PM CLINICAL HISTORY: [id number] Pain [id number] Pain COMPARISON: None available. FINDINGS: BONES AND JOINTS: No acute fracture. Small plantar calcaneal spur. No joint dislocation. SOFT TISSUES: Vascular calcifications in the soft tissues. IMPRESSION: 1. No acute abnormality. Electronically signed by: Elsie Gravely MD 07/06/2024 01:55 PM EST RP Workstation: [id number]   DG Chest Portable 1 View Result Date: 07/04/2024 EXAM: 1 VIEW(S) XRAY OF THE CHEST 07/04/2024 09:34:00 PM COMPARISON: 12/19/2023 CLINICAL HISTORY: Dyspnea, AMS. FINDINGS: LUNGS AND PLEURA: Shallow inspiration. Lungs are clear. Pulmonary vascularity is normal. HEART AND MEDIASTINUM: Heart size is normal. Calcification of the aorta. BONES AND SOFT TISSUES: Degenerative changes in the spine. No acute osseous abnormality. IMPRESSION: 1. No acute cardiopulmonary process. Electronically signed by: Elsie Gravely MD 07/04/2024 09:51 PM EST RP Workstation: [id number]   CT Head Wo Contrast Result Date: 07/04/2024 CLINICAL DATA:  Altered level of consciousness, weakness EXAM: CT HEAD WITHOUT CONTRAST TECHNIQUE: Contiguous axial images were obtained from the base of the skull through the vertex without intravenous contrast. RADIATION DOSE REDUCTION: This exam was performed according to the departmental dose-optimization program which includes automated exposure control, adjustment of the mA and/or kV according to patient size and/or use of iterative reconstruction technique. COMPARISON:  12/18/2023 FINDINGS: Brain: Stable chronic ischemic changes of the bilateral parietooccipital regions, periventricular white matter, and basal ganglia. No evidence of acute infarct or hemorrhage. Lateral ventricles and midline structures are unremarkable. No acute extra-axial fluid collections. No mass effect. Vascular: No hyperdense vessel or unexpected calcification. Skull: Normal. Negative for fracture or focal lesion. Sinuses/Orbits: No acute finding. Other: None. IMPRESSION: 1. Stable head  CT, no acute intracranial process. Electronically Signed   By: Ozell Daring M.D.   On: 07/04/2024 21:37    ECHO 05/2024: SEVERE LEFT VENTRICULAR SYSTOLIC DYSFUNCTION WITH NO LVH  ESTIMATED EF: 20%, CALC EF(3D): 24%  DIASTOLIC FUNCTION CAN'T BE DETERMINED  MILD RIGHT VENTRICULAR SYSTOLIC DYSFUNCTION  VALVULAR REGURGITATION: TRIVIAL AR, SEVERE MR, TRIVIAL PR, TRIVIAL TR  ESTIMATED RVSP: 33 mmHg (Normal)  NO VALVULAR STENOSIS   TELEMETRY (personally reviewed): sinus rhythm rate 70s, PVCs  EKG (personally reviewed): NSR rate 92 bpm  Data reviewed by me 07/14/2024: last 24h vitals tele labs imaging I/O ED provider note, admission H&P, hospitalist progress notes, palliative care notes  Principal Problem:   Generalized weakness Active Problems:   Elevated troponin   Essential hypertension   Late onset Alzheimer's disease  without behavioral disturbance (HCC)   AKI (acute kidney injury)   Hyperglycemia   IDA (iron deficiency anemia)   Frailty   Dementia (HCC)   Chronic HFrEF (heart failure with reduced ejection fraction) (HCC)   Chronic anticoagulation   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 Treated for possible UTI as well as acute heart failure during admission, had L thoracentesis yesterday. Cardiology was consulted for further evaluation.   # Acute on chronic HFrEF # Ischemic cardiomyopathy # Hx of LV thrombus # Vascular dementia Patient initially brought to the ED for elevated blood glucose, weakness. Concern for failure to thrive as he is frail appearing with poor PO intake, worsening confusion, and per wife is not very independent at home. Also found to have worsening heart function on recent echo. Has been treated for heart failure exacerbation during admission with CXR today relatively improved.  -BP limiting further dosing of lasix. Given CXR today will hold off on ordering another dose and monitor O2 requirements.  -Continue eliquis  5 mg twice daily.  -Continue atorvastatin  40 mg daily.  -Mild and flat trops most consistent with demand/supply mismatch and not ACS.  Patient with gradually declining functional status and worsening heart function recently. He is at high risk of further decline. Palliative care is following and have had GOC discussions with wife. At this time there is not much else to offer from a cardiac perspective. I had an extensive discussion with wife today regarding his  hospitalization thus far, his recent echo results and current heart failure exacerbation. Explained that this is the manifestation of progressive heart failure. Wife expressed understanding of this but continues to report that she thinks medications he has received (specifically Haldol  - last dose 11/27) are still contributing to his status, I explained that this medication should at this point be out of his system and would not contribute to heart failure and that his EF was reduced prior to this hospitalization.  This patient's plan of care was discussed and created with Dr. Florencio and he is in agreement.  Signed: Danita Bloch, PA-C  07/14/2024, 9:57 AM [facility] Cardiology

## 2024-07-14 NOTE — TOC Progression Note (Signed)
 Transition of Care [facility]) - Progression Note    Patient Details  Name: Luke Chen MRN: [id number] Date of Birth: 1941-01-26  Transition of Care [facility] CM/SW Contact  Daved JONETTA Hamilton, RN Phone Number: 07/14/2024, 1:11 PM  Clinical Narrative:     Bed offers presented to patient spouse [name]. [name] verbalized to this CM that she has requested the patient be transferred to [facility] for a second opinion and that all of his regular doctors are there.   Dr. Marsa has put a call out to [facility], awaiting call back. TOC will continue to follow.  Expected Discharge Plan: Home w Home Health Services Barriers to Discharge: Continued Medical Work up               Expected Discharge Plan and Services     Post Acute Care Choice: Home Health Living arrangements for the past 2 months: Single Family Home Expected Discharge Date: 07/09/24                                     Social Drivers of Health (SDOH) Interventions SDOH Screenings   Food Insecurity: No Food Insecurity (07/05/2024)  Housing: Low Risk  (07/05/2024)  Transportation Needs: No Transportation Needs (07/05/2024)  Utilities: Not At Risk (07/05/2024)  Alcohol Screen: Low Risk  (08/26/2021)  Depression (PHQ2-9): Low Risk  (08/26/2021)  Financial Resource Strain: Low Risk  (08/26/2021)  Physical Activity: Inactive (08/26/2021)  Social Connections: Moderately Integrated (07/05/2024)  Stress: No Stress Concern Present (08/26/2021)  Tobacco Use: Low Risk  (07/04/2024)    Readmission Risk Interventions     No data to display

## 2024-07-15 LAB — CBC
HCT: 39.8 % (ref 39.0–52.0)
Hemoglobin: 12.6 g/dL — ABNORMAL LOW (ref 13.0–17.0)
MCH: 29.6 pg (ref 26.0–34.0)
MCHC: 31.7 g/dL (ref 30.0–36.0)
MCV: 93.4 fL (ref 80.0–100.0)
Platelets: 181 K/uL (ref 150–400)
RBC: 4.26 MIL/uL (ref 4.22–5.81)
RDW: 23.5 % — ABNORMAL HIGH (ref 11.5–15.5)
WBC: 6.3 K/uL (ref 4.0–10.5)
nRBC: 0 % (ref 0.0–0.2)

## 2024-07-15 LAB — GLUCOSE, CAPILLARY
Glucose-Capillary: 149 mg/dL — ABNORMAL HIGH (ref 70–99)
Glucose-Capillary: 163 mg/dL — ABNORMAL HIGH (ref 70–99)
Glucose-Capillary: 182 mg/dL — ABNORMAL HIGH (ref 70–99)
Glucose-Capillary: 205 mg/dL — ABNORMAL HIGH (ref 70–99)
Glucose-Capillary: 261 mg/dL — ABNORMAL HIGH (ref 70–99)
Glucose-Capillary: 301 mg/dL — ABNORMAL HIGH (ref 70–99)
Glucose-Capillary: 74 mg/dL (ref 70–99)

## 2024-07-15 LAB — BASIC METABOLIC PANEL WITH GFR
Anion gap: 13 (ref 5–15)
BUN: 31 mg/dL — ABNORMAL HIGH (ref 8–23)
CO2: 26 mmol/L (ref 22–32)
Calcium: 9.2 mg/dL (ref 8.9–10.3)
Chloride: 105 mmol/L (ref 98–111)
Creatinine, Ser: 1.14 mg/dL (ref 0.61–1.24)
GFR, Estimated: >60 mL/min (ref >60)
Glucose, Bld: 147 mg/dL — ABNORMAL HIGH (ref 70–99)
Potassium: 3.8 mmol/L (ref 3.5–5.1)
Sodium: 144 mmol/L (ref 135–145)

## 2024-07-15 MED ORDER — FUROSEMIDE 20 MG PO TABS
20.0000 mg | ORAL_TABLET | Freq: Every day | ORAL | Status: DC
  Administered 2024-07-15 – 2024-07-17 (×3): 20 mg via ORAL
  Filled 2024-07-15 (×3): qty 1

## 2024-07-15 MED ORDER — INSULIN ASPART 100 UNIT/ML IJ SOLN
0.0000 [IU] | Freq: Three times a day (TID) | INTRAMUSCULAR | Status: DC
  Administered 2024-07-15: 8 [IU] via SUBCUTANEOUS
  Filled 2024-07-15: qty 8

## 2024-07-15 MED ORDER — CLOPIDOGREL BISULFATE 75 MG PO TABS
75.0000 mg | ORAL_TABLET | Freq: Every day | ORAL | Status: DC
  Administered 2024-07-15 – 2024-07-17 (×3): 75 mg via ORAL
  Filled 2024-07-15 (×3): qty 1

## 2024-07-15 MED ORDER — INSULIN ASPART 100 UNIT/ML IJ SOLN: 0.0000 [IU] | Freq: Three times a day (TID) | INTRAMUSCULAR | Status: DC

## 2024-07-15 NOTE — Progress Notes (Signed)
 PROGRESS NOTE    Gery Sabedra   FMW:[id number] DOB: [birth year]  DOA: 07/04/2024 Date of Service: 07/15/24 which is hospital day 10  PCP: P.A., Sheralyn Flock, MD    Hospital course / significant events:   Luke Chen is a 83 y.o. 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   HPI: Wife at the bedside who gives a history, states that he had been declining for some time, sleeping for most of the day. States the iron infusions have not been helping him as expected. Due to increased concern she called EMS to bring him in.   11/21: to ED. Started insulin  drip.  11/22: admitted to hospitalist service, hyperglycemia, hypotension, AKI, failure to thrive. Continue abx, cautious hydration. Got Haldol  x1 for sundowning.  11/23: hypotensive, adjusting cardiac meds. PT/OT recs for LTC 11/24:  wife has goal for pt to go abck home and not LTC. Palliative care consulted 11/25: palliative care unable to reach pt's wife.  11/26: palliative care met w/ patient's wife. On rounds, I also discussed goals / options. Wife is back and forth - TOC following, see A/P. Pt has received 5 days abx, UCx mult species, will give last dose abx today 11/27: unable to reach pts wife. Clinically no change but pt appears comfortable. last dose Haldol  was overnight 11/27 00:52 11/28: d/w wife at bedside, dispo still in process, see below, pt not improving. PT/OT to try to see this afternoon while wife is present --> see IPAL note. Confirm for DNR which is appropriate. Otherwise treat as able, will work on SNF rehab placement  11/29: hypoxic overnight, lasix ordered. This AM CT Chest (+)effusion --> thora. Continue lasix. Palliative care saw pt today and d/w wife.  11/30: 3.2L UOP yesterday, mild AKI now, will get  1 dose lasix this AM and continue monitor output. Pt appears comfortable. See A/P for GOC discussion  12/01: Pt remains minimally alert, he is eating some w/ encouragement. [facility] denied transfer. Repeat CXR clear. Cardiology consult - nothing further to offer at this time. Plan for now to ensure medical stability over next 1-2 days, if he remains stable Mrs. Bellard will have to decide regarding SNF, TOC following  12/02: wife refusing insulin  unless Glc >200, custom sliding scale ordered. Pt stable through today.      Consultants:  Palliative care  Interventional Radiology Cardiology   Procedures/Surgeries: L Thoracentesis 07/12/24 removal 400 cc      ASSESSMENT & PLAN:   Generalized weakness Dehydration/hypotension Debility  Suspect multifactorial, including dehydration, possible underlying infection, hyperglycemia, with background of anemia and frailty  Troponin elevated but downtrended so low suspicion for ACS Urinalysis shows possible UTI, UCx mult species  Fall and aspiration precautions PT OT consulted and recommending SNF - see below    Hyperglycemia on admission DKA, resolved Hypoglycemia d/t poor po intake  Patient has been weaned off insulin  drip SSI insulin  q4h was held d/t hypoglycemia now reinitiated d/t Glc 200+ without substantial po intake, pt's wife is refusing insulin  unless Glc >200, custom sliding scale ac ordered.   D50 prn   Possible urinary tract infection but ruled out Urinalysis showed possible UTI, UCx mult species  Completed ceftriaxone   Acute hypoxic respiratory failure d/t pleural effusion - improved s/p thoracentesis  Underlying COPD but not on home  O2, no apparent exacerbation at this time Repeat CXR prn O2 prn  COPD as below   Chronic HFrEF (heart failure with reduced ejection fraction) with acute exacerbation Echo last month at [facility] EF 20 Have had to hold GDMT meds due to soft blood pressure Monitor input and output Lasix  per cardiology  - held today w/ clear CXR  Cardiology consulted - not much more to offer at this point, high risk patient   Dementia with behavioral disturbance I have discussed w/ Mrs. Jozwiak that he is fitting the clinical diagnosis of worsening vascular dementia. I affirmed that an abrupt change like this can be very difficult to witness and to manage. Mrs. Cutrone believes that sedating medication is cause for his problems now. Note: last dose Haldol  was 11/27 00:52, >72h ago. I have affirmed that sedating medication can certainly alter a patient temporarily but these medications are not affecting him now. I have been firm that I will support use of these medications if there is a need to use them for his safety or for the staff's safety. We will try to not use meds but I have been clear that I do not think such medications are the main factor in his decline, but rather the dementia is the problem in addition to the DKA/AKI, CHF, poor po intake  Delirium precautions Continue Aricept  Fall and aspiration precautions One-to-one precaution  Haldol  prn - please limit use to absolute necessity for safety and then at lowest effective dose Palliative has signed off but can reengage as needed      COPD not in exacerbation Suspect might be some chronic hypoxia Bronchodilators prn  O2 prn  Elevated troponin CAD s/p stent to LCx and LAD Suspect demand ischemia.  Troponin 143-> 128 Recheck as needed   AKI (acute kidney injury)-resolved Creatinine 1.36 up from baseline of 0.9 on 10/7 Renal function back to baseline Monitor renal function and avoid nephrotoxins    Chronic anticoagulation History of LV thrombus apixiban    IDA (iron deficiency anemia) On iron transfusion, last transfusion 11/17 Monitor CBC Hemoglobin fairly stable   Essential hypertension Holding antihypertensives due to soft blood pressure and also he is getting lasix  periodically   Advanced care planning See previous notes for daily  conversations  12/02: wife has no questions today. I let her know if he remains stable into tomorrow we will probably dc tele and need to make decisions about SNF    No concerns based on BMI: Body mass index is 20.59 kg/m.SABRA Significantly low or high BMI is associated with higher medical risk.  Underweight - under 18  overweight - 25 to 29 obese - 30 or more Class 1 obesity: BMI of 30.0 to 34 Class 2 obesity: BMI of 35.0 to 39 Class 3 obesity: BMI of 40.0 to 49 Super Morbid Obesity: BMI 50-59 Super-super Morbid Obesity: BMI 60+ Healthy nutrition and physical activity advised as adjunct to other disease management and risk reduction treatments    DVT prophylaxis: eliquis  - holding for now  IV fluids: no continuous IV fluids  Nutrition: as tolerated Central lines / other devices: none  Code Status: DNR ACP documentation reviewed: none on file in [facility]  TOC needs: placement Medical barriers to dispo: monitoring fluid status CHF but expect medically optimized soon.             Subjective / Brief ROS:  Patient not contributory He awakens to voice but does not verbalize  He appears comfortable Wife has no  concerns today and offers that he has eaten well this morning   Family Communication: wife at bedside - see A/P for discussion     Objective Findings:  Vitals:   07/14/24 1605 07/14/24 2000 07/15/24 0809 07/15/24 1144  BP: 113/73 111/83 (!) 100/46 (!) 96/59  Pulse: 85 90 74 79  Resp: 15 14 17 17   Temp: 99.1 F (37.3 C) (!) 97.5 F (36.4 C) (!) 97.3 F (36.3 C) (!) 97.4 F (36.3 C)  TempSrc:  Oral    SpO2: 92% 100% 100% 100%  Weight:      Height:        Intake/Output Summary (Last 24 hours) at 07/15/2024 1526 Last data filed at 07/15/2024 9472 Gross per 24 hour  Intake --  Output 400 ml  Net -400 ml   Filed Weights   07/04/24 2347 07/05/24 2000 07/06/24 0326  Weight: 54.4 kg 63.9 kg 63.2 kg    Examination:  Physical Exam Constitutional:       General: He is not in acute distress. Cardiovascular:     Rate and Rhythm: Normal rate and regular rhythm.  Pulmonary:     Effort: No respiratory distress.     Breath sounds: Normal breath sounds.     Comments: Diminshed breath sounds but clear today and better air movement than previously Abdominal:     Palpations: Abdomen is soft.  Musculoskeletal:     Right lower leg: No edema.     Left lower leg: No edema.  Skin:    General: Skin is warm and dry.     Coloration: Skin is pale.  Neurological:     Mental Status: He is disoriented.  Psychiatric:        Behavior: Behavior normal.          Scheduled Medications:   acetaminophen   650 mg Oral TID   apixaban   5 mg Oral BID   atorvastatin   40 mg Oral Daily   clopidogrel   75 mg Oral Daily   dextrose   1 ampule Intravenous Once   donepezil   10 mg Oral Daily   feeding supplement  237 mL Oral BID BM   furosemide  20 mg Oral Daily   insulin  aspart  0-12 Units Subcutaneous TID WC    morphine injection  2 mg Intravenous Once    Continuous Infusions:    PRN Medications:  acetaminophen  **OR** acetaminophen , dextrose , glucagon (human recombinant), haloperidol  lactate, ipratropium-albuterol , ondansetron  **OR** ondansetron  (ZOFRAN ) IV, mouth rinse  Antimicrobials from admission:  Anti-infectives (From admission, onward)    Start     Dose/Rate Route Frequency Ordered Stop   07/05/24 1730  cefTRIAXone  (ROCEPHIN ) 1 g in sodium chloride  0.9 % 100 mL IVPB  Status:  Discontinued        1 g 200 mL/hr over 30 Minutes Intravenous Every 24 hours 07/05/24 1622 07/10/24 0835           Data Reviewed:  I have personally reviewed the following...  CBC: Recent Labs  Lab 07/09/24 0526 07/11/24 1010 07/12/24 1011 07/13/24 0929 07/14/24 0530 07/15/24 0454  WBC 9.4 7.1 8.4 9.5 7.5 6.3  NEUTROABS 5.7  --  5.5  --   --   --   HGB 10.6* 10.6* 11.9* 12.6* 12.0* 12.6*  HCT 32.2* 33.5* 37.2* 39.3 37.5* 39.8  MCV 88.7 92.5 93.2 92.9  92.1 93.4  PLT 204 191 185 198 183 181   Basic Metabolic Panel: Recent Labs  Lab 07/11/24 1010 07/12/24 1011 07/13/24 0929 07/14/24 0530  07/15/24 0454  NA 141 142 141 144 144  K 4.9 4.9 4.6 3.8 3.8  CL 107 107 104 106 105  CO2 18* 23 23 24 26   GLUCOSE 249* 193* 306* 176* 147*  BUN 40* 39* 36* 32* 31*  CREATININE 1.22 1.16 1.30* 1.15 1.14  CALCIUM  9.2 9.4 9.5 9.2 9.2   GFR: Estimated Creatinine Clearance: 45.4 mL/min (by C-G formula based on SCr of 1.14 mg/dL). Liver Function Tests: Recent Labs  Lab 07/12/24 1011 07/14/24 0530  AST 47* 38  ALT 58* 42  ALKPHOS 195* 165*  BILITOT 0.6 0.6  PROT 6.4* 6.2*  ALBUMIN 3.7 3.5   No results for input(s): LIPASE, AMYLASE in the last 168 hours. No results for input(s): AMMONIA in the last 168 hours. Coagulation Profile: No results for input(s): INR, PROTIME in the last 168 hours. Cardiac Enzymes: No results for input(s): CKTOTAL, CKMB, CKMBINDEX, TROPONINI in the last 168 hours.  BNP (last 3 results) No results for input(s): PROBNP in the last 8760 hours. HbA1C: Recent Labs    07/13/24 0929  HGBA1C 9.2*   CBG: Recent Labs  Lab 07/15/24 0010 07/15/24 0408 07/15/24 0711 07/15/24 0807 07/15/24 1141  GLUCAP 205* 149* 74 163* 301*   Lipid Profile: No results for input(s): CHOL, HDL, LDLCALC, TRIG, CHOLHDL, LDLDIRECT in the last 72 hours. Thyroid  Function Tests: No results for input(s): TSH, T4TOTAL, FREET4, T3FREE, THYROIDAB in the last 72 hours. Anemia Panel: No results for input(s): VITAMINB12, FOLATE, FERRITIN, TIBC, IRON, RETICCTPCT in the last 72 hours. Most Recent Urinalysis On File:     Component Value Date/Time   COLORURINE AMBER (A) 07/05/2024 0441   APPEARANCEUR HAZY (A) 07/05/2024 0441   LABSPEC 1.020 07/05/2024 0441   PHURINE 5.0 07/05/2024 0441   GLUCOSEU >=500 (A) 07/05/2024 0441   HGBUR MODERATE (A) 07/05/2024 0441   BILIRUBINUR NEGATIVE  07/05/2024 0441   BILIRUBINUR neg 01/02/2022 1407   KETONESUR NEGATIVE 07/05/2024 0441   PROTEINUR 100 (A) 07/05/2024 0441   UROBILINOGEN negative (A) 01/02/2022 1407   NITRITE NEGATIVE 07/05/2024 0441   LEUKOCYTESUR TRACE (A) 07/05/2024 0441   Sepsis Labs: @LABRCNTIP (procalcitonin:4,lacticidven:4) Microbiology: Recent Results (from the past 240 hours)  Body fluid culture w Gram Stain     Status: None (Preliminary result)   Collection Time: 07/12/24 11:09 AM   Specimen: PATH Cytology Pleural fluid  Result Value Ref Range Status   Specimen Description   Final    PLEURAL Performed at [facility], [address] [place], [place] [address]    Special Requests   Final    NONE Performed at [facility], [address] [place], [place] [address]    Gram Stain NO WBC SEEN NO ORGANISMS SEEN   Final   Culture   Final    NO GROWTH 3 DAYS Performed at [facility], [address] [place], [place] [address]    Report Status PENDING  Incomplete      Radiology Studies last 3 days: DG Chest Port 1 View Result Date: 07/14/2024 CLINICAL DATA:  Follow-up pleural effusion.  Hyperglycemia. EXAM: PORTABLE CHEST 1 VIEW COMPARISON:  07/12/2024 FINDINGS: No significant change in mild enlargement of the cardiac silhouette. Small right pleural effusion with improvement with decreased right basilar atelectasis. No left pleural effusion seen. Resolved left basilar atelectasis. No pneumothorax. Thoracic spine degenerative changes. IMPRESSION: 1. Small right pleural effusion with improvement and decreased right basilar atelectasis. 2. Resolved left pleural effusion and left basilar atelectasis. Electronically Signed  By: Elspeth Bathe M.D.   On: 07/14/2024 14:17   US  THORACENTESIS ASP PLEURAL SPACE W/IMG GUIDE Result Date: 07/13/2024 INDICATION: 83 year old male with history of dementia, generalized weakness, and chronic heart failure, now with bilateral pleural effusions,  left greater than right. IR was requested for diagnostic and therapeutic left-sided thoracentesis. EXAM: ULTRASOUND GUIDED DIAGNOSTIC AND THERAPEUTIC THORACENTESIS MEDICATIONS: 4 cc of 1% lidocaine. COMPLICATIONS: None immediate. PROCEDURE: An ultrasound guided thoracentesis was thoroughly discussed with the patient and questions answered. The benefits, risks, alternatives and complications were also discussed. The patient understands and wishes to proceed with the procedure. Written consent was obtained. Ultrasound was performed to localize and mark an adequate pocket of fluid in the left chest. The area was then prepped and draped in the normal sterile fashion. 1% Lidocaine was used for local anesthesia. Under ultrasound guidance a 6 Fr Safe-T-Centesis catheter was introduced. Thoracentesis was performed. The catheter was removed and a dressing applied. FINDINGS: A total of approximately 400 mL of clear, straw-colored pleural fluid was removed. Samples were sent to the laboratory as requested by the clinical team. IMPRESSION: Successful ultrasound guided left thoracentesis yielding 400 mL of pleural fluid. Procedure performed by Carlin Griffon, PA-C Electronically Signed   By: Cordella Banner   On: 07/13/2024 11:27   DG Chest Port 1 View Result Date: 07/12/2024 CLINICAL DATA:  Status post left thoracentesis EXAM: PORTABLE CHEST 1 VIEW COMPARISON:  Earlier same day CT chest, chest radiograph dated 07/10/2024 FINDINGS: Mildly low lung volumes. Diffuse interlobular septal thickening. Persistent dense left retrocardiac opacity, likely atelectasis. Decreased small left pleural effusion. Skin fold along the lateral pleura. Similar layering moderate to large right pleural effusion. No pneumothorax. Similar enlarged cardiomediastinal silhouette. No acute osseous abnormality. IMPRESSION: 1. Decreased small left pleural effusion status post thoracentesis. No pneumothorax. 2. Similar layering moderate to large right  pleural effusion. 3. Pulmonary edema. Electronically Signed   By: Limin  Xu M.D.   On: 07/12/2024 11:53   CT CHEST W CONTRAST Result Date: 07/12/2024 CLINICAL DATA:  Generalized weakness and hypoxia EXAM: CT CHEST WITH CONTRAST TECHNIQUE: Multidetector CT imaging of the chest was performed during intravenous contrast administration. RADIATION DOSE REDUCTION: This exam was performed according to the departmental dose-optimization program which includes automated exposure control, adjustment of the mA and/or kV according to patient size and/or use of iterative reconstruction technique. CONTRAST:  80mL OMNIPAQUE  IOHEXOL  300 MG/ML  SOLN COMPARISON:  Chest radiograph dated 07/10/2024, CT chest dated 12/18/2023 FINDINGS: Cardiovascular: Mild multichamber cardiomegaly. No significant pericardial fluid/thickening. Great vessels are normal in course and caliber. No central pulmonary emboli. Coronary artery calcifications and aortic atherosclerosis. Mediastinum/Nodes: Imaged thyroid  gland without nodules meeting criteria for imaging follow-up by size. Normal esophagus. No pathologically enlarged axillary, supraclavicular, mediastinal, or hilar lymph nodes. Lungs/Pleura: The central airways are patent. Secretions within the distal bronchus extending into the right bronchus intermedius. Mild diffuse bronchial wall thickening. Moderate diffuse interlobular septal thickening. Relaxation atelectasis of the bilateral lower lobes. No pneumothorax. Large bilateral pleural effusions. Upper abdomen: Partially imaged small volume ascites. Musculoskeletal: No acute or abnormal lytic or blastic osseous lesions. Multilevel degenerative changes of the thoracic spine. Diffuse body wall edema. IMPRESSION: 1. Pulmonary edema and large bilateral pleural effusions. 2. Mild multichamber cardiomegaly. 3. Partially imaged small volume ascites. 4.  Aortic Atherosclerosis (ICD10-I70.0). Electronically Signed   By: Limin  Xu M.D.   On: 07/12/2024  11:08           Laneta Blunt, DO [facility] 07/15/2024, 3:26  PM    Dictation software may have been used to generate the above note. Typos may occur and escape review in typed/dictated notes. Please contact Dr Marsa directly for clarity if needed.  Staff may message me via secure chat in Epic  but this may not receive an immediate response,  please page me for urgent matters!  If 7PM-7AM, please contact night coverage [email or web address]

## 2024-07-15 NOTE — TOC Progression Note (Signed)
 Transition of Care [facility]) - Progression Note    Patient Details  Name: Luke Chen MRN: [id number] Date of Birth: 10-Jul-1941  Transition of Care [facility]) CM/SW Contact  Daved JONETTA Hamilton, RN Phone Number: 07/15/2024, 10:05 AM  Clinical Narrative:     Referral faxed to [facility] in [place], [place]  Expected Discharge Plan: Home w Home Health Services Barriers to Discharge: Continued Medical Work up               Expected Discharge Plan and Services     Post Acute Care Choice: Home Health Living arrangements for the past 2 months: Single Family Home Expected Discharge Date: 07/09/24                                     Social Drivers of Health (SDOH) Interventions SDOH Screenings   Food Insecurity: No Food Insecurity (07/05/2024)  Housing: Low Risk  (07/05/2024)  Transportation Needs: No Transportation Needs (07/05/2024)  Utilities: Not At Risk (07/05/2024)  Alcohol Screen: Low Risk  (08/26/2021)  Depression (PHQ2-9): Low Risk  (08/26/2021)  Financial Resource Strain: Low Risk  (08/26/2021)  Physical Activity: Inactive (08/26/2021)  Social Connections: Moderately Integrated (07/05/2024)  Stress: No Stress Concern Present (08/26/2021)  Tobacco Use: Low Risk  (07/04/2024)    Readmission Risk Interventions     No data to display

## 2024-07-15 NOTE — Plan of Care (Signed)
   Problem: Health Behavior/Discharge Planning: Goal: Ability to manage health-related needs will improve Outcome: Progressing   Problem: Clinical Measurements: Goal: Ability to maintain clinical measurements within normal limits will improve Outcome: Progressing Goal: Diagnostic test results will improve Outcome: Progressing Goal: Respiratory complications will improve Outcome: Progressing

## 2024-07-15 NOTE — TOC Progression Note (Addendum)
 Transition of Care [facility]) - Progression Note    Patient Details  Name: Luke Chen MRN: [id number] Date of Birth: Jun 20, 1941  Transition of Care [facility]) CM/SW Contact  Daved JONETTA Hamilton, RN Phone Number: 07/15/2024, 9:03 AM  Clinical Narrative:  LATE ENTRY:     Accompanied Dr. Marsa to meet with patient's spouse [name] at patient's bedside. During provider discussion about patient status, [facility] declined transfer, and plan of care moving forward, [name] verbalized she would like TOC to look for SNF close to [facility], no preferences. [name] verbalized if patient were to become sick at [facility], [facility] is where she would want patient to be taken to. TOC to work on this and follow up with [name] if/when bed offer(s) received.   Expected Discharge Plan: Home w Home Health Services Barriers to Discharge: Continued Medical Work up               Expected Discharge Plan and Services     Post Acute Care Choice: Home Health Living arrangements for the past 2 months: Single Family Home Expected Discharge Date: 07/09/24                                     Social Drivers of Health (SDOH) Interventions SDOH Screenings   Food Insecurity: No Food Insecurity (07/05/2024)  Housing: Low Risk  (07/05/2024)  Transportation Needs: No Transportation Needs (07/05/2024)  Utilities: Not At Risk (07/05/2024)  Alcohol Screen: Low Risk  (08/26/2021)  Depression (PHQ2-9): Low Risk  (08/26/2021)  Financial Resource Strain: Low Risk  (08/26/2021)  Physical Activity: Inactive (08/26/2021)  Social Connections: Moderately Integrated (07/05/2024)  Stress: No Stress Concern Present (08/26/2021)  Tobacco Use: Low Risk  (07/04/2024)    Readmission Risk Interventions     No data to display

## 2024-07-15 NOTE — Progress Notes (Signed)
 Patient's BS was 149 this am requiring 1 unit insulin .  When I attempted to give medication patient's wife refused stating his BS dropped to 46 on the 2024-09-06 and he almost died.

## 2024-07-15 NOTE — Progress Notes (Signed)
 [facility] CARDIOLOGY PROGRESS NOTE       Patient ID: Luke Chen MRN: [id number] DOB/AGE: [birth year] 83 y.o.  Admit date: 07/04/2024 Referring Physician Dr. Laneta Blunt Primary Physician P.A., Sheralyn Flock, MD  Primary Cardiologist Dr. Samuel Cahill ([facility]) Reason for Consultation AoCHF  HPI: Luke Chen is a 83 y.o. 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 Treated for possible UTI as well as acute heart failure during admission, had L thoracentesis yesterday. Cardiology was consulted for further evaluation.   Interval history: -Patient seen and examined this AM, resting in hospital bed.  -Awake today and conversant but confused. -BP remains soft.  -Cr normal today.  Review of systems complete and found to be negative unless listed above    Past Medical History:  Diagnosis Date   Dementia (HCC)    Diabetes mellitus without complication (HCC)    Hypertension     History reviewed. No pertinent surgical history.  Medications Prior to Admission  Medication Sig Dispense Refill Last Dose/Taking   atorvastatin  (LIPITOR) 40 MG tablet Take 40 mg by mouth daily.   07/04/2024   clopidogrel  (PLAVIX ) 75 MG tablet Take 75 mg by mouth daily.   07/04/2024   donepezil  (ARICEPT ) 10 MG tablet TAKE 1 TABLET BY MOUTH EVERY DAY 90 tablet 2 07/04/2024   ELIQUIS  5 MG TABS tablet Take 5 mg by mouth 2 (two) times daily.   07/04/2024   glimepiride  (AMARYL ) 2 MG tablet Take 1 tablet (2 mg total) by mouth daily at 6 (six) AM. 90 tablet 1 07/04/2024   lisinopril  (ZESTRIL ) 5 MG tablet Take 1 tablet (5 mg total) by mouth daily. 90 tablet 3 07/04/2024   metFORMIN  (GLUCOPHAGE ) 1000 MG  tablet TAKE 1 TABLET BY MOUTH TWICE A DAY 180 tablet 1 07/04/2024   metoprolol  succinate (TOPROL -XL) 25 MG 24 hr tablet Take 0.5 tablets by mouth daily.   07/04/2024   spironolactone  (ALDACTONE ) 25 MG tablet Take 12.5 mg by mouth daily.   07/04/2024   ascorbic acid (VITAMIN C) 1000 MG tablet Take 1,000 mg by mouth daily at 2 PM.      cyanocobalamin (VITAMIN B12) 1000 MCG tablet Take 1,000 mcg by mouth once a week.      Social History   Socioeconomic History   Marital status: Married    Spouse name: Not on file   Number of children: Not on file   Years of education: Not on file   Highest education level: Not on file  Occupational History   Not on file  Tobacco Use   Smoking status: Never   Smokeless tobacco: Never  Substance and Sexual Activity   Alcohol use: Never   Drug use: Never   Sexual activity: Not Currently  Other Topics Concern   Not on file  Social History Narrative   Not on file   Social Drivers of Health   Financial Resource Strain: Low Risk  (08/26/2021)   Overall Financial Resource Strain (CARDIA)    Difficulty of Paying Living Expenses: Not hard at all  Food Insecurity: No Food Insecurity (07/05/2024)   Hunger Vital Sign    Worried About Running Out of Food in the Last Year: Never true    Ran  Out of Food in the Last Year: Never true  Transportation Needs: No Transportation Needs (07/05/2024)   PRAPARE - Administrator, Civil Service (Medical): No    Lack of Transportation (Non-Medical): No  Physical Activity: Inactive (08/26/2021)   Exercise Vital Sign    Days of Exercise per Week: 0 days    Minutes of Exercise per Session: 0 min  Stress: No Stress Concern Present (08/26/2021)   [employer] of Occupational Health - Occupational Stress Questionnaire    Feeling of Stress : Only a little  Social Connections: Moderately Integrated (07/05/2024)   Social Connection and Isolation Panel    Frequency of Communication with Friends and Family: Once a  week    Frequency of Social Gatherings with Friends and Family: Once a week    Attends Religious Services: 1 to 4 times per year    Active Member of [employer] or Organizations: Yes    Attends Banker Meetings: 1 to 4 times per year    Marital Status: Married  Catering Manager Violence: Not At Risk (07/05/2024)   Humiliation, Afraid, Rape, and Kick questionnaire    Fear of Current or Ex-Partner: No    Emotionally Abused: No    Physically Abused: No    Sexually Abused: No    History reviewed. No pertinent family history.   Vitals:   07/14/24 1152 07/14/24 1605 07/14/24 2000 07/15/24 0809  BP: (!) 104/57 113/73 111/83 (!) 100/46  Pulse: 73 85 90 74  Resp: 15 15 14 17   Temp: 97.6 F (36.4 C) 99.1 F (37.3 C) (!) 97.5 F (36.4 C) (!) 97.3 F (36.3 C)  TempSrc: Oral  Oral   SpO2: 99% 92% 100% 100%  Weight:      Height:        PHYSICAL EXAM General: Acute on chronically ill appearing male, well nourished, in no acute distress. HEENT: Normocephalic and atraumatic. Neck: No JVD.  Lungs: Normal respiratory effort on 3L [place]. Clear bilaterally to auscultation. No wheezes, crackles, rhonchi.  Heart: HRRR. Normal S1 and S2 without gallops or murmurs.  Abdomen: Non-distended appearing.  Msk: Normal strength and tone for age. Extremities: Warm and well perfused. No clubbing, cyanosis. No edema.  Neuro: Alert and oriented X 3. Psych: Answers questions appropriately.   Labs: Basic Metabolic Panel: Recent Labs    07/14/24 0530 07/15/24 0454  NA 144 144  K 3.8 3.8  CL 106 105  CO2 24 26  GLUCOSE 176* 147*  BUN 32* 31*  CREATININE 1.15 1.14  CALCIUM  9.2 9.2   Liver Function Tests: Recent Labs    07/12/24 1011 07/14/24 0530  AST 47* 38  ALT 58* 42  ALKPHOS 195* 165*  BILITOT 0.6 0.6  PROT 6.4* 6.2*  ALBUMIN 3.7 3.5   No results for input(s): LIPASE, AMYLASE in the last 72 hours. CBC: Recent Labs    07/12/24 1011 07/13/24 0929 07/14/24 0530  07/15/24 0454  WBC 8.4   < > 7.5 6.3  NEUTROABS 5.5  --   --   --   HGB 11.9*   < > 12.0* 12.6*  HCT 37.2*   < > 37.5* 39.8  MCV 93.2   < > 92.1 93.4  PLT 185   < > 183 181   < > = values in this interval not displayed.   Cardiac Enzymes: No results for input(s): CKTOTAL, CKMB, CKMBINDEX, TROPONINIHS in the last 72 hours. BNP: No results for input(s): BNP in the last 72  hours. D-Dimer: No results for input(s): DDIMER in the last 72 hours. Hemoglobin A1C: Recent Labs    07/13/24 0929  HGBA1C 9.2*   Fasting Lipid Panel: No results for input(s): CHOL, HDL, LDLCALC, TRIG, CHOLHDL, LDLDIRECT in the last 72 hours. Thyroid  Function Tests: No results for input(s): TSH, T4TOTAL, T3FREE, THYROIDAB in the last 72 hours.  Invalid input(s): FREET3 Anemia Panel: No results for input(s): VITAMINB12, FOLATE, FERRITIN, TIBC, IRON, RETICCTPCT in the last 72 hours.   Radiology: [facility] Chest Port 1 View Result Date: 07/14/2024 CLINICAL DATA:  Follow-up pleural effusion.  Hyperglycemia. EXAM: PORTABLE CHEST 1 VIEW COMPARISON:  07/12/2024 FINDINGS: No significant change in mild enlargement of the cardiac silhouette. Small right pleural effusion with improvement with decreased right basilar atelectasis. No left pleural effusion seen. Resolved left basilar atelectasis. No pneumothorax. Thoracic spine degenerative changes. IMPRESSION: 1. Small right pleural effusion with improvement and decreased right basilar atelectasis. 2. Resolved left pleural effusion and left basilar atelectasis. Electronically Signed   By: Elspeth Bathe M.D.   On: 07/14/2024 14:17   US  THORACENTESIS ASP PLEURAL SPACE W/IMG GUIDE Result Date: 07/13/2024 INDICATION: 83 year old male with history of dementia, generalized weakness, and chronic heart failure, now with bilateral pleural effusions, left greater than right. IR was requested for diagnostic and therapeutic left-sided thoracentesis.  EXAM: ULTRASOUND GUIDED DIAGNOSTIC AND THERAPEUTIC THORACENTESIS MEDICATIONS: 4 cc of 1% lidocaine. COMPLICATIONS: None immediate. PROCEDURE: An ultrasound guided thoracentesis was thoroughly discussed with the patient and questions answered. The benefits, risks, alternatives and complications were also discussed. The patient understands and wishes to proceed with the procedure. Written consent was obtained. Ultrasound was performed to localize and mark an adequate pocket of fluid in the left chest. The area was then prepped and draped in the normal sterile fashion. 1% Lidocaine was used for local anesthesia. Under ultrasound guidance a 6 Fr Safe-T-Centesis catheter was introduced. Thoracentesis was performed. The catheter was removed and a dressing applied. FINDINGS: A total of approximately 400 mL of clear, straw-colored pleural fluid was removed. Samples were sent to the laboratory as requested by the clinical team. IMPRESSION: Successful ultrasound guided left thoracentesis yielding 400 mL of pleural fluid. Procedure performed by Carlin Griffon, PA-C Electronically Signed   By: Cordella Banner   On: 07/13/2024 11:27   DG Chest Port 1 View Result Date: 07/12/2024 CLINICAL DATA:  Status post left thoracentesis EXAM: PORTABLE CHEST 1 VIEW COMPARISON:  Earlier same day CT chest, chest radiograph dated 07/10/2024 FINDINGS: Mildly low lung volumes. Diffuse interlobular septal thickening. Persistent dense left retrocardiac opacity, likely atelectasis. Decreased small left pleural effusion. Skin fold along the lateral pleura. Similar layering moderate to large right pleural effusion. No pneumothorax. Similar enlarged cardiomediastinal silhouette. No acute osseous abnormality. IMPRESSION: 1. Decreased small left pleural effusion status post thoracentesis. No pneumothorax. 2. Similar layering moderate to large right pleural effusion. 3. Pulmonary edema. Electronically Signed   By: Limin  Xu M.D.   On: 07/12/2024 11:53    CT CHEST W CONTRAST Result Date: 07/12/2024 CLINICAL DATA:  Generalized weakness and hypoxia EXAM: CT CHEST WITH CONTRAST TECHNIQUE: Multidetector CT imaging of the chest was performed during intravenous contrast administration. RADIATION DOSE REDUCTION: This exam was performed according to the departmental dose-optimization program which includes automated exposure control, adjustment of the mA and/or kV according to patient size and/or use of iterative reconstruction technique. CONTRAST:  80mL OMNIPAQUE  IOHEXOL  300 MG/ML  SOLN COMPARISON:  Chest radiograph dated 07/10/2024, CT chest dated 12/18/2023 FINDINGS: Cardiovascular: Mild multichamber cardiomegaly.  No significant pericardial fluid/thickening. Great vessels are normal in course and caliber. No central pulmonary emboli. Coronary artery calcifications and aortic atherosclerosis. Mediastinum/Nodes: Imaged thyroid  gland without nodules meeting criteria for imaging follow-up by size. Normal esophagus. No pathologically enlarged axillary, supraclavicular, mediastinal, or hilar lymph nodes. Lungs/Pleura: The central airways are patent. Secretions within the distal bronchus extending into the right bronchus intermedius. Mild diffuse bronchial wall thickening. Moderate diffuse interlobular septal thickening. Relaxation atelectasis of the bilateral lower lobes. No pneumothorax. Large bilateral pleural effusions. Upper abdomen: Partially imaged small volume ascites. Musculoskeletal: No acute or abnormal lytic or blastic osseous lesions. Multilevel degenerative changes of the thoracic spine. Diffuse body wall edema. IMPRESSION: 1. Pulmonary edema and large bilateral pleural effusions. 2. Mild multichamber cardiomegaly. 3. Partially imaged small volume ascites. 4.  Aortic Atherosclerosis (ICD10-I70.0). Electronically Signed   By: Limin  Xu M.D.   On: 07/12/2024 11:08   DG Chest 1 View Result Date: 07/10/2024 EXAM: 1 VIEW(S) XRAY OF THE CHEST 07/10/2024  03:54:00 AM COMPARISON: 07/04/2024 CLINICAL HISTORY: Dyspnea FINDINGS: LUNGS AND PLEURA: There are low lung volumes with hazy opacification of both lung bases and obscuration of the hemidiaphragms. No pleural effusion. No pneumothorax. HEART AND MEDIASTINUM: There is calcification within the aortic arch. No acute abnormality of the cardiac and mediastinal silhouettes. BONES AND SOFT TISSUES: No acute osseous abnormality. IMPRESSION: 1. Low lung volumes with hazy opacification of both lung bases and obscuration of the hemidiaphragms, likely secondary to a combination of pleural effusions and atelectasis. Electronically signed by: Evalene Coho MD 07/10/2024 04:04 AM EST RP Workstation: [id number]   DG Ankle 2 Views Left Result Date: 07/06/2024 EXAM: 2 VIEW(S) XRAY OF THE LEFT ANKLE 07/06/2024 01:53:00 PM CLINICAL HISTORY: [id number] Pain [id number] Pain COMPARISON: None available. FINDINGS: BONES AND JOINTS: No acute fracture. Small plantar calcaneal spur. No joint dislocation. SOFT TISSUES: Vascular calcifications in the soft tissues. IMPRESSION: 1. No acute abnormality. Electronically signed by: Elsie Gravely MD 07/06/2024 01:55 PM EST RP Workstation: [id number]   DG Chest Portable 1 View Result Date: 07/04/2024 EXAM: 1 VIEW(S) XRAY OF THE CHEST 07/04/2024 09:34:00 PM COMPARISON: 12/19/2023 CLINICAL HISTORY: Dyspnea, AMS. FINDINGS: LUNGS AND PLEURA: Shallow inspiration. Lungs are clear. Pulmonary vascularity is normal. HEART AND MEDIASTINUM: Heart size is normal. Calcification of the aorta. BONES AND SOFT TISSUES: Degenerative changes in the spine. No acute osseous abnormality. IMPRESSION: 1. No acute cardiopulmonary process. Electronically signed by: Elsie Gravely MD 07/04/2024 09:51 PM EST RP Workstation: [id number]   CT Head Wo Contrast Result Date: 07/04/2024 CLINICAL DATA:  Altered level of consciousness, weakness EXAM: CT HEAD WITHOUT CONTRAST TECHNIQUE: Contiguous axial images were obtained from  the base of the skull through the vertex without intravenous contrast. RADIATION DOSE REDUCTION: This exam was performed according to the departmental dose-optimization program which includes automated exposure control, adjustment of the mA and/or kV according to patient size and/or use of iterative reconstruction technique. COMPARISON:  12/18/2023 FINDINGS: Brain: Stable chronic ischemic changes of the bilateral parietooccipital regions, periventricular white matter, and basal ganglia. No evidence of acute infarct or hemorrhage. Lateral ventricles and midline structures are unremarkable. No acute extra-axial fluid collections. No mass effect. Vascular: No hyperdense vessel or unexpected calcification. Skull: Normal. Negative for fracture or focal lesion. Sinuses/Orbits: No acute finding. Other: None. IMPRESSION: 1. Stable head CT, no acute intracranial process. Electronically Signed   By: Ozell Daring M.D.   On: 07/04/2024 21:37    ECHO 05/2024: SEVERE LEFT VENTRICULAR SYSTOLIC DYSFUNCTION WITH NO LVH  ESTIMATED EF:  20%, CALC EF(3D): 24%  DIASTOLIC FUNCTION CAN'T BE DETERMINED  MILD RIGHT VENTRICULAR SYSTOLIC DYSFUNCTION  VALVULAR REGURGITATION: TRIVIAL AR, SEVERE MR, TRIVIAL PR, TRIVIAL TR  ESTIMATED RVSP: 33 mmHg (Normal)  NO VALVULAR STENOSIS   TELEMETRY (personally reviewed): sinus rhythm rate 70s, PVCs  EKG (personally reviewed): NSR rate 92 bpm  Data reviewed by me 07/15/2024: last 24h vitals tele labs imaging I/O ED provider note, admission H&P, hospitalist progress notes, palliative care notes  Principal Problem:   Generalized weakness Active Problems:   Elevated troponin   Essential hypertension   Late onset Alzheimer's disease without behavioral disturbance (HCC)   AKI (acute kidney injury)   Hyperglycemia   IDA (iron deficiency anemia)   Frailty   Dementia (HCC)   Chronic HFrEF (heart failure with reduced ejection fraction) (HCC)   Chronic anticoagulation   Diabetic  acidosis without coma (HCC)    ASSESSMENT AND PLAN:  Luke Chen is a 83 y.o. 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 Treated for possible UTI as well as acute heart failure during admission, had L thoracentesis yesterday. Cardiology was consulted for further evaluation.   # Acute on chronic HFrEF # Ischemic cardiomyopathy # Hx of LV thrombus # Vascular dementia Patient initially brought to the ED for elevated blood glucose, weakness. Concern for failure to thrive as he is frail appearing with poor PO intake, worsening confusion, and per wife is not very independent at home. Also found to have worsening heart function on recent echo. Has been treated for heart failure exacerbation during admission with CXR today relatively improved.  -Will start PO lasix 20 mg today. -Continue eliquis  5 mg twice daily.  -Continue atorvastatin  40 mg daily.  -Mild and flat trops most consistent with demand/supply mismatch and not ACS.  Patient with gradually declining functional status and worsening heart function recently. He is at high risk of further decline. Palliative care is following and have had GOC discussions with wife.   This patient's plan of care was discussed and created with Dr. Florencio and he is in agreement.  Signed: Danita Bloch, PA-C  07/15/2024, 9:54 AM [facility] Cardiology

## 2024-07-15 NOTE — Plan of Care (Signed)
 Spoke with attending Dr. Marsa.  Discussed patient's status and attending's discussions with family.  Per conversation, PMT will sign off.  Primary team to reconsult if needs arise.

## 2024-07-16 DIAGNOSIS — R531 Weakness: Secondary | ICD-10-CM | POA: Diagnosis not present

## 2024-07-16 LAB — BODY FLUID CULTURE W GRAM STAIN
Culture: NO GROWTH
Gram Stain: NONE SEEN

## 2024-07-16 LAB — BASIC METABOLIC PANEL WITH GFR
Anion gap: 9 (ref 5–15)
BUN: 33 mg/dL — ABNORMAL HIGH (ref 8–23)
CO2: 28 mmol/L (ref 22–32)
Calcium: 8.6 mg/dL — ABNORMAL LOW (ref 8.9–10.3)
Chloride: 104 mmol/L (ref 98–111)
Creatinine, Ser: 1.13 mg/dL (ref 0.61–1.24)
GFR, Estimated: >60 mL/min (ref >60)
Glucose, Bld: 324 mg/dL — ABNORMAL HIGH (ref 70–99)
Potassium: 3.8 mmol/L (ref 3.5–5.1)
Sodium: 142 mmol/L (ref 135–145)

## 2024-07-16 LAB — GLUCOSE, CAPILLARY
Glucose-Capillary: 163 mg/dL — ABNORMAL HIGH (ref 70–99)
Glucose-Capillary: 210 mg/dL — ABNORMAL HIGH (ref 70–99)
Glucose-Capillary: 256 mg/dL — ABNORMAL HIGH (ref 70–99)
Glucose-Capillary: 317 mg/dL — ABNORMAL HIGH (ref 70–99)
Glucose-Capillary: 340 mg/dL — ABNORMAL HIGH (ref 70–99)
Glucose-Capillary: 343 mg/dL — ABNORMAL HIGH (ref 70–99)
Glucose-Capillary: 415 mg/dL — ABNORMAL HIGH (ref 70–99)
Glucose-Capillary: 59 mg/dL — ABNORMAL LOW (ref 70–99)
Glucose-Capillary: 96 mg/dL (ref 70–99)

## 2024-07-16 MED ORDER — INSULIN ASPART 100 UNIT/ML IJ SOLN
0.0000 [IU] | INTRAMUSCULAR | Status: DC
  Administered 2024-07-16 (×2): 11 [IU] via SUBCUTANEOUS
  Filled 2024-07-16: qty 11
  Filled 2024-07-16: qty 3
  Filled 2024-07-16: qty 11

## 2024-07-16 MED ORDER — INSULIN ASPART 100 UNIT/ML IJ SOLN
20.0000 [IU] | Freq: Once | INTRAMUSCULAR | Status: AC
  Administered 2024-07-16: 20 [IU] via SUBCUTANEOUS
  Filled 2024-07-16: qty 20

## 2024-07-16 MED ORDER — INSULIN ASPART 100 UNIT/ML IJ SOLN: 0.0000 [IU] | INTRAMUSCULAR | Status: DC

## 2024-07-16 MED ORDER — DEXTROSE 50 % IV SOLN
12.5000 g | INTRAVENOUS | Status: AC
  Administered 2024-07-16: 12.5 g via INTRAVENOUS
  Filled 2024-07-16: qty 50

## 2024-07-16 NOTE — TOC Progression Note (Addendum)
 Transition of Care [facility]) - Progression Note    Patient Details  Name: Luke Chen MRN: [id number] Date of Birth: [birth year]  [facility] CM/SW Contact  Daved JONETTA Hamilton, RN Phone Number: 07/16/2024, 11:14 AM  Clinical Narrative:     Received call from Tor[facility] with [facility] informed this CM that they can offer placement for patient for tomorrow. Daniel requested I provide patient's spouse his telephone number so that he can discuss with her the facility, any out of pocket costs, and so forth.  UPDATE:  Notified Luke Chen of bed offer at [facility], Luke Chen verbally informed this CM that she is no longer interested in this and she would like patient to go to SNF here locally and that [facility] is her choice.   Expected Discharge Plan: Home w Home Health Services Barriers to Discharge: Continued Medical Work up               Expected Discharge Plan and Services     Post Acute Care Choice: Home Health Living arrangements for the past 2 months: Single Family Home Expected Discharge Date: 07/09/24                                     Social Drivers of Health (SDOH) Interventions SDOH Screenings   Food Insecurity: No Food Insecurity (07/05/2024)  Housing: Low Risk  (07/05/2024)  Transportation Needs: No Transportation Needs (07/05/2024)  Utilities: Not At Risk (07/05/2024)  Alcohol Screen: Low Risk  (08/26/2021)  Depression (PHQ2-9): Low Risk  (08/26/2021)  Financial Resource Strain: Low Risk  (08/26/2021)  Physical Activity: Inactive (08/26/2021)  Social Connections: Moderately Integrated (07/05/2024)  Stress: No Stress Concern Present (08/26/2021)  Tobacco Use: Low Risk  (07/04/2024)    Readmission Risk Interventions     No data to display

## 2024-07-16 NOTE — Progress Notes (Signed)
 Dr Lawence notified of increase in blood sugars.  At 1949 BS was 261, rechecked at 0016 415 and at 0116 343.  20 units of insulin  given twice per order, once after 418 and once after the 343.  Patient wakes easily and wife is at bedside.

## 2024-07-16 NOTE — Inpatient Diabetes Management (Signed)
 Inpatient Diabetes Program Recommendations  AACE/ADA: New Consensus Statement on Inpatient Glycemic Control   Target Ranges:  Prepandial:   less than 140 mg/dL      Peak postprandial:   less than 180 mg/dL (1-2 hours)      Critically ill patients:  140 - 180 mg/dL    Latest Reference Range & Units 07/16/24 00:16 07/16/24 01:16 07/16/24 02:01 07/16/24 03:46 07/16/24 07:28  Glucose-Capillary 70 - 99 mg/dL 584 (H)  Novolog  20 units @ 00:43 343 (H)  Novolog  20 units @1 :37 256 (H) 96 59 (L)    Latest Reference Range & Units 07/15/24 07:11 07/15/24 08:07 07/15/24 11:41 07/15/24 17:12 07/15/24 19:49  Glucose-Capillary 70 - 99 mg/dL 74 836 (H) 698 (H)  Novolog  8 units 182 (H) 261 (H)   Review of Glycemic Control  Diabetes history: DM2 Outpatient Diabetes medications: Amaryl  2 mg QAM, Metformin  1000 mg BID Current orders for Inpatient glycemic control: Novolog  0-20 units Q4H  Inpatient Diabetes Program Recommendations:    Insulin : CBG 415 at 00:16 and received Novolog  20 units at 00:43. CBG 343 mg/dl at 8:83 am and patient received Novolog  20 units again at 1:37 am. As a result of receiving a total of Novolog  40 units within an hour of each dose, glucose then down to 59 mg/dl at 2:71 this morning.  Please decrease Novolog  correction scale to 0-15 units Q4H.   NOTE: Per note by Dr. Marsa on 12/2, patient's wife is refusing for patient to get insulin  unless CBG is over 200 mg/dl.   Thanks, Earnie Gainer, RN, MSN, CDCES Diabetes Coordinator Inpatient Diabetes Program [phone] (Team Pager from 8am to 5pm)

## 2024-07-16 NOTE — Plan of Care (Signed)
   Problem: Health Behavior/Discharge Planning: Goal: Ability to manage health-related needs will improve Outcome: Progressing   Problem: Clinical Measurements: Goal: Ability to maintain clinical measurements within normal limits will improve Outcome: Progressing Goal: Will remain free from infection Outcome: Progressing Goal: Diagnostic test results will improve Outcome: Progressing Goal: Respiratory complications will improve Outcome: Progressing

## 2024-07-16 NOTE — Progress Notes (Signed)
 Occupational Therapy Treatment Patient Details Name: Luke Chen MRN: [id number] DOB: [birth year] Today's Date: 07/16/2024   History of present illness Luke Chen is an 83yoM who comes to [facility] on 11/21 via [facility], wife called EMS after pt felt weak and lie down on the floor. BG in 400s. 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   OT comments  Patient seen for OT treatment on this date. Upon arrival to room patient in bed, lethargic but arouseable OT and PT-OT/PT attempted to change gown, patient became agitated and began to swing and attempted to bite therapist; patient not easily redirected. Wife present but unable to redirect. Patient remained in bed with mits on and all 4 rails up; RN notified.        If plan is discharge home, recommend the following:  Two people to help with walking and/or transfers;Two people to help with bathing/dressing/bathroom;Assistance with feeding;Assistance with cooking/housework;Direct supervision/assist for medications management;Direct supervision/assist for financial management;Assist for transportation;Help with stairs or ramp for entrance;Supervision due to cognitive status   Equipment Recommendations  Other (comment) (deferred)    Recommendations for Other Services      Precautions / Restrictions Precautions Precautions: Fall Recall of Precautions/Restrictions: Impaired Restrictions Weight Bearing Restrictions Per Provider Order: No       Mobility Bed Mobility               General bed mobility comments: refused    Transfers                   General transfer comment: refused     Balance                                           ADL either performed or assessed with clinical judgement   ADL Overall ADL's : Needs assistance/impaired                 Upper Body Dressing : Total assistance;Bed level                          Extremity/Trunk Assessment               Vision       Perception     Praxis     Communication Communication Communication: Impaired Factors Affecting Communication: Difficulty expressing self;Hearing impaired   Cognition Arousal: Lethargic Behavior During Therapy: Agitated, Restless Cognition: History of cognitive impairments                               Following commands: Impaired Following commands impaired: Follows one step commands inconsistently      Cueing   Cueing Techniques: Verbal cues, Gestural cues, Tactile cues, Visual cues  Exercises      Shoulder Instructions       General Comments      Pertinent Vitals/ Pain       Pain Assessment Pain Assessment: Faces Breathing: occasional labored breathing, short period of hyperventilation Negative Vocalization: occasional moan/groan, low speech, negative/disapproving quality Facial Expression: smiling or inexpressive Body Language: rigid, fists clenched, knees up, pushing/pulling away, strikes out Consolability: unable to console, distract or reassure PAINAD Score: 6 Pain Location: generalized Pain Intervention(s): Monitored during session  Home Living  Prior Functioning/Environment              Frequency  Min 2X/week        Progress Toward Goals  OT Goals(current goals can now be found in the care plan section)  Progress towards OT goals: Not progressing toward goals - comment (agitated)  Acute Rehab OT Goals Patient Stated Goal: none OT Goal Formulation: With family Time For Goal Achievement: 07/19/24 Potential to Achieve Goals: Fair ADL Goals Pt Will Perform Grooming: with min assist;sitting Pt Will Perform Lower Body Dressing: with min assist;sit to/from stand Pt Will Transfer to Toilet: with min assist;bedside commode  Plan      Co-evaluation    PT/OT/SLP Co-Evaluation/Treatment: Yes Reason for Co-Treatment: Complexity of the  patient's impairments (multi-system involvement);For patient/therapist safety;To address functional/ADL transfers;Necessary to address cognition/behavior during functional activity PT goals addressed during session: Mobility/safety with mobility;Balance;Proper use of DME;Strengthening/ROM OT goals addressed during session: ADL's and self-care      AM-PAC OT 6 Clicks Daily Activity     Outcome Measure   Help from another person eating meals?: A Lot Help from another person taking care of personal grooming?: Total Help from another person toileting, which includes using toliet, bedpan, or urinal?: Total Help from another person bathing (including washing, rinsing, drying)?: Total Help from another person to put on and taking off regular upper body clothing?: Total Help from another person to put on and taking off regular lower body clothing?: Total 6 Click Score: 7    End of Session Equipment Utilized During Treatment: Oxygen   OT Visit Diagnosis: Unsteadiness on feet (R26.81);History of falling (Z91.81);Other symptoms and signs involving cognitive function   Activity Tolerance Treatment limited secondary to agitation   Patient Left in bed;with bed alarm set;with nursing/sitter in room;with family/visitor present   Nurse Communication Other (comment) (agitation)        Time: 8651-8593 OT Time Calculation (min): 18 min  Charges: OT General Charges $OT Visit: 1 Visit OT Treatments $Self Care/Home Management : 8-22 mins  Rogers Clause, OT/L MSOT, 07/16/2024

## 2024-07-16 NOTE — Progress Notes (Signed)
 PROGRESS NOTE    Luke Chen  FMW:[id number] DOB: 06-07-1941 DOA: 07/04/2024 PCP: HULDA., Sheralyn Flock, MD    Brief Narrative:  Hospital course / significant events:   Luke Chen is a 83 y.o. 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   HPI: Wife at the bedside who gives a history, states that he had been declining for some time, sleeping for most of the day. States the iron infusions have not been helping him as expected. Due to increased concern she called EMS to bring him in.   11/21: to ED. Started insulin  drip.  11/22: admitted to hospitalist service, hyperglycemia, hypotension, AKI, failure to thrive. Continue abx, cautious hydration. Got Haldol  x1 for sundowning.  11/23: hypotensive, adjusting cardiac meds. PT/OT recs for LTC 11/24:  wife has goal for pt to go abck home and not LTC. Palliative care consulted 11/25: palliative care unable to reach pt's wife.  11/26: palliative care met w/ patient's wife. On rounds, I also discussed goals / options. Wife is back and forth - TOC following, see A/P. Pt has received 5 days abx, UCx mult species, will give last dose abx today 11/27: unable to reach pts wife. Clinically no change but pt appears comfortable. last dose Haldol  was overnight 11/27 00:52 11/28: d/w wife at bedside, dispo still in process, see below, pt not improving. PT/OT to try to see this afternoon while wife is present --> see IPAL note. Confirm for DNR which is appropriate. Otherwise treat as able, will work on SNF rehab placement  11/29: hypoxic overnight, lasix  ordered. This AM CT Chest (+)effusion --> thora. Continue lasix . Palliative care saw pt today and d/w wife.  11/30: 3.2L UOP yesterday, mild AKI now, will get 1 dose lasix  this AM and continue  monitor output. Pt appears comfortable. See A/P for GOC discussion  12/01: Pt remains minimally alert, he is eating some w/ encouragement. [facility] denied transfer. Repeat CXR clear. Cardiology consult - nothing further to offer at this time. Plan for now to ensure medical stability over next 1-2 days, if he remains stable Mrs. Luke Chen will have to decide regarding SNF, TOC following  12/02: wife refusing insulin  unless Glc >200, custom sliding scale ordered. Pt stable through today.  12/03: Hyperglycemic overnight, for which he received Novolog  20 units x 2, after which he became hypoglycemic this morning with cBG 59. Insulin  regimen adjusted.     DVT prophylaxis: eliquis  - holding for now  IV fluids: no continuous IV fluids  Nutrition: as tolerated Central lines / other devices: none  Code Status: DNR ACP documentation reviewed: none on file in [facility]  TOC needs: placement Medical barriers to dispo: monitoring fluid status CHF but expect medically optimized soon.         Assessment and Plan:  Generalized weakness Dehydration/hypotension Debility  Suspect multifactorial, including dehydration, possible underlying infection, hyperglycemia, with background of anemia and frailty  Troponin elevated but downtrended so low suspicion for ACS Urinalysis shows possible UTI, UCx mult species  Fall and aspiration precautions PT OT consulted and recommending SNF - see below    Hyperglycemia on admission DKA, resolved Hypoglycemia d/t poor po intake  Patient has been weaned off insulin  drip SSI insulin  q4h was held d/t hypoglycemia now reinitiated d/t Glc 200+ without substantial po intake, pt's  wife is refusing insulin  unless Glc >200 Episode of hyperglycemia overnight 12/2 followed by hypoglycemia 12/3 AM after Novolog  20 units x 2 overnight SSI adjusted to 0-15 q4h D50 prn   Possible urinary tract infection but ruled out Urinalysis showed possible UTI, UCx mult species  Completed  ceftriaxone   Acute hypoxic respiratory failure d/t pleural effusion - improved s/p thoracentesis  Underlying COPD but not on home O2, no apparent exacerbation at this time CXR (12/1) showed small right pleural effusion with improvement and decreased right basilar atelectasis, resolved left pleural effusion and left basilar atelectasis. Continue supplemental O2 prn  COPD as below   Chronic HFrEF (heart failure with reduced ejection fraction) with acute exacerbation (LVEF 20%) Ischemic cardiomyopathy Have had to hold GDMT meds due to soft blood pressure Monitor input and output Cardiology signed off today - recommended continuation of PO Laxis 20 mg daily, otherwise GDMT limited due to hypotension Continue Lipitor 40 mg daily, Plavix  75 mg daily, Eliquis  5 mg BID   Dementia with behavioral disturbance Per prior attending provider I have discussed w/ Mrs. 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 I affirmed that an abrupt change like this can be very difficult to witness and to manage. Mrs. Luke Chen believes that sedating medication is cause for his problems now. Note: last dose Haldol  was 11/27 00:52, >72h ago. I have affirmed that sedating medication can certainly alter a patient temporarily but these medications are not affecting him now. I have been firm that I will support use of these medications if there is a need to use them for his safety or for the staff's safety. We will try to not use meds but I have been clear that I do not think such medications are the main factor in his decline, but rather the dementia is the problem in addition to the DKA/AKI, CHF, poor po intake Delirium precautions Continue Aricept  Fall and aspiration precautions One-to-one precaution  Haldol  prn - please limit use to absolute necessity for safety and then at lowest effective dose Palliative has signed off but can reengage as needed     COPD not in exacerbation Suspect  might be some chronic hypoxia Bronchodilators prn  O2 prn  Elevated troponin CAD s/p stent to LCx and LAD Suspect demand ischemia.  Troponin 143-> 128   AKI (acute kidney injury)-resolved Creatinine 1.36 up from baseline of 0.9 on 10/7 Renal function back to baseline Monitor renal function and avoid nephrotoxins    Chronic anticoagulation History of LV thrombus apixiban    IDA (iron deficiency anemia) On iron transfusion, last transfusion 11/17 Monitor CBC Hemoglobin fairly stable   Essential hypertension Holding antihypertensives due to soft blood pressure and also he is getting lasix periodically   Advanced care planning See previous notes for daily conversations  Plan for discharge to SNF likely 12/4   DVT prophylaxis: apixaban  (ELIQUIS ) tablet 5 mg   Code Status:   Code Status: Limited: Do not attempt resuscitation (DNR) -DNR-LIMITED -Do Not Intubate/DNI   Family Communication: Dicussed with patient's wife at bedside  Disposition Plan: [facility]charge [facility] tomorrow PT - Follow Up Recommendations: Skilled nursing-short term rehab (<3 hours/day) OT - Follow Up Recommendations: Skilled nursing-short term rehab (<3 hours/day)  DME Needs: PT equipment: Wheelchair (measurements PT), Wheelchair cushion (measurements PT), Hospital bed, Hoyer lift     Level of care: Med-Surg  Consultants:  Palliative care  Interventional Radiology Cardiology   Procedures:  L Thoracentesis 07/12/24 removal 400  cc  Antimicrobials: None   Subjective: Patient examined at bedside. Had hypoglycemia to 59 this morning after correction of hyperglycemia overnight. No complaints by the patient this morning. Patient's wife expressed concern about the patient being made NPO last night, unclear why that occurred, patient's diet was resumed. Discussed that cardiology have indicated they are satisfied with the patient's volume status at this time and the patient will be discharged to SNF when  bed is available. Patient's wife was in agreement with this plan.  Objective: Vitals:   07/16/24 1648 07/16/24 1854 07/16/24 2041 07/16/24 2057  BP: 120/76 105/76 99/62   Pulse: (!) 49 94 85 (!) 107  Resp: 17 15    Temp: 97.8 F (36.6 C) 97.9 F (36.6 C) 98.6 F (37 C)   TempSrc: Oral Oral Oral   SpO2: 90% 95% (!) 82% 100%  Weight:      Height:        Intake/Output Summary (Last 24 hours) at 07/16/2024 2321 Last data filed at 07/16/2024 2100 Gross per 24 hour  Intake 240 ml  Output 550 ml  Net -310 ml   Filed Weights   07/04/24 2347 07/05/24 2000 07/06/24 0326  Weight: 54.4 kg 63.9 kg 63.2 kg    Examination:  Gen: NAD, A&Ox3 HEENT: NCAT, PERRLA, EOMI Neck: Supple, no JVD CV: RRR, no murmurs Resp: normal WOB, CTAB, no w/r/r Abd: Soft, NTND, no guarding, BS normoactive Ext: No LE edema, pulses 2+ b/l Skin: Warm, dry, no rashes/lesions Neuro: CN II-XII grossly intact, strength 5/5 b/l, sensation intact Psych: Calm, cooperative, appropriate affect   Data Reviewed: I have personally reviewed following labs and imaging studies  CBC: Recent Labs  Lab 07/11/24 1010 07/12/24 1011 07/13/24 0929 07/14/24 0530 07/15/24 0454  WBC 7.1 8.4 9.5 7.5 6.3  NEUTROABS  --  5.5  --   --   --   HGB 10.6* 11.9* 12.6* 12.0* 12.6*  HCT 33.5* 37.2* 39.3 37.5* 39.8  MCV 92.5 93.2 92.9 92.1 93.4  PLT 191 185 198 183 181   Basic Metabolic Panel: Recent Labs  Lab 07/12/24 1011 07/13/24 0929 07/14/24 0530 07/15/24 0454 07/16/24 0125  NA 142 141 144 144 142  K 4.9 4.6 3.8 3.8 3.8  CL 107 104 106 105 104  CO2 23 23 24 26 28   GLUCOSE 193* 306* 176* 147* 324*  BUN 39* 36* 32* 31* 33*  CREATININE 1.16 1.30* 1.15 1.14 1.13  CALCIUM  9.4 9.5 9.2 9.2 8.6*   GFR: Estimated Creatinine Clearance: 45.8 mL/min (by C-G formula based on SCr of 1.13 mg/dL). Liver Function Tests: Recent Labs  Lab 07/12/24 1011 07/14/24 0530  AST 47* 38  ALT 58* 42  ALKPHOS 195* 165*  BILITOT 0.6  0.6  PROT 6.4* 6.2*  ALBUMIN 3.7 3.5   No results for input(s): LIPASE, AMYLASE in the last 168 hours. No results for input(s): AMMONIA in the last 168 hours. Coagulation Profile: No results for input(s): INR, PROTIME in the last 168 hours. Cardiac Enzymes: No results for input(s): CKTOTAL, CKMB, CKMBINDEX, TROPONINI in the last 168 hours. BNP (last 3 results) No results for input(s): PROBNP in the last 8760 hours. HbA1C: No results for input(s): HGBA1C in the last 72 hours. CBG: Recent Labs  Lab 07/16/24 0728 07/16/24 0746 07/16/24 1136 07/16/24 1612 07/16/24 2043  GLUCAP 59* 210* 163* 317* 340*   Lipid Profile: No results for input(s): CHOL, HDL, LDLCALC, TRIG, CHOLHDL, LDLDIRECT in the last 72 hours. Thyroid  Function Tests: No  results for input(s): TSH, T4TOTAL, FREET4, T3FREE, THYROIDAB in the last 72 hours. Anemia Panel: No results for input(s): VITAMINB12, FOLATE, FERRITIN, TIBC, IRON, RETICCTPCT in the last 72 hours. Sepsis Labs: No results for input(s): PROCALCITON, LATICACIDVEN in the last 168 hours.  Recent Results (from the past 240 hours)  Body fluid culture w Gram Stain     Status: None   Collection Time: 07/12/24 11:09 AM   Specimen: PATH Cytology Pleural fluid  Result Value Ref Range Status   Specimen Description   Final    PLEURAL Performed at [facility], [address] [place], [place] [address]    Special Requests   Final    NONE Performed at [facility], [address] [place], [place] [address]    Gram Stain NO WBC SEEN NO ORGANISMS SEEN   Final   Culture   Final    NO GROWTH 3 DAYS Performed at [facility], [address], [place], [place] [address]    Report Status 07/16/2024 FINAL  Final     Radiology Studies: No results found.  Scheduled Meds:  acetaminophen   650 mg Oral TID   apixaban   5 mg Oral BID   atorvastatin   40 mg Oral Daily    clopidogrel   75 mg Oral Daily   dextrose   1 ampule Intravenous Once   donepezil   10 mg Oral Daily   feeding supplement  237 mL Oral BID BM   furosemide  20 mg Oral Daily   insulin  aspart  0-15 Units Subcutaneous Q4H    morphine injection  2 mg Intravenous Once   Continuous Infusions:   Unresulted Labs (From admission, onward)     Start     Ordered   07/17/24 0500  Basic metabolic panel with GFR  Daily,   R     Question:  Specimen collection method  Answer:  Lab=Lab collect   07/16/24 2322   07/06/24 0500  CBC with Differential/Platelet  Tomorrow morning,   R       Question:  Specimen collection method  Answer:  Lab=Lab collect   07/05/24 1727             LOS:  LOS: 11 days   Time Spent: 40 minutes  Minnette Merida Al-Sultani, MD [facility]  If 7PM-7AM, please contact night-coverage  07/16/2024, 11:21 PM

## 2024-07-16 NOTE — TOC Progression Note (Addendum)
 Transition of Care [facility]) - Progression Note    Patient Details  Name: Luke Chen MRN: [id number] Date of Birth: 10-May-1941  [facility] CM/SW Contact  Daved JONETTA Hamilton, RN Phone Number: 07/16/2024, 1:02 PM  Clinical Narrative:     This CM was contacted by phone from 1C nurses station advising patient's spouse and [name] are requesting to speak with me.   Met Pastor, introduced self. [name] discussed with me his and [name]'s concerns regarding patient's stay here. Discussed with [name] that all of [name]'s concerns have been addressed and that TOC has been and is still working to place patient to [employer] requests.   Myself and [name] met with [name] at patient's bedside and [name] discussed with her and the patient the information I provided to him. This CM also advised [name] that [facility] has declined referral. [name] verbalized she would now like patient to go to [facility]. This CM reviewed offers and confirmed [facility] is an option. Advised [name] that I will notify [facility] of bed acceptance, [name] verbalized understanding and agreement.   [facility] selected in [place] and [place] with [facility]. Anticipate patient to be medically ready for discharge tomorrow.   UPDATE:  Spoke with patient's spouse just to confirm that patient is anticipated to discharge tomorrow and will go to [facility]. [name] verbalized understanding and agreement to this plan.   Expected Discharge Plan: Home w Home Health Services Barriers to Discharge: Continued Medical Work up               Expected Discharge Plan and Services     Post Acute Care Choice: Home Health Living arrangements for the past 2 months: Single Family Home Expected Discharge Date: 07/09/24                                     Social Drivers of Health (SDOH) Interventions SDOH Screenings   Food Insecurity: No Food Insecurity (07/05/2024)  Housing: Low Risk  (07/05/2024)   Transportation Needs: No Transportation Needs (07/05/2024)  Utilities: Not At Risk (07/05/2024)  Alcohol Screen: Low Risk  (08/26/2021)  Depression (PHQ2-9): Low Risk  (08/26/2021)  Financial Resource Strain: Low Risk  (08/26/2021)  Physical Activity: Inactive (08/26/2021)  Social Connections: Moderately Integrated (07/05/2024)  Stress: No Stress Concern Present (08/26/2021)  Tobacco Use: Low Risk  (07/04/2024)    Readmission Risk Interventions     No data to display

## 2024-07-16 NOTE — Plan of Care (Signed)
  Problem: Education: Goal: Knowledge of General Education information will improve Description Including pain rating scale, medication(s)/side effects and non-pharmacologic comfort measures Outcome: Progressing   Problem: Health Behavior/Discharge Planning: Goal: Ability to manage health-related needs will improve Outcome: Progressing   Problem: Clinical Measurements: Goal: Ability to maintain clinical measurements within normal limits will improve Outcome: Progressing   Problem: Activity: Goal: Risk for activity intolerance will decrease Outcome: Progressing   Problem: Nutrition: Goal: Adequate nutrition will be maintained Outcome: Progressing   Problem: Coping: Goal: Level of anxiety will decrease Outcome: Progressing   Problem: Safety: Goal: Ability to remain free from injury will improve Outcome: Progressing   Problem: Skin Integrity: Goal: Risk for impaired skin integrity will decrease Outcome: Progressing   

## 2024-07-16 NOTE — Progress Notes (Addendum)
 [facility] CLINIC CARDIOLOGY PROGRESS NOTE       Patient ID: Luke Chen MRN: [id number] DOB/AGE: [birth year] 83 y.o.  Admit date: 07/04/2024 Referring Physician Dr. Laneta Blunt Primary Physician P.A., Sheralyn Flock, MD  Primary Cardiologist Dr. Samuel Cahill ([facility]) Reason for Consultation AoCHF  HPI: Luke Chen is a 82 y.o. 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 Treated for possible UTI as well as acute heart failure during admission, had L thoracentesis yesterday. Cardiology was consulted for further evaluation.   Interval history: -Patient seen and examined this AM, resting in hospital bed with wife at bedside. -Sleeping, does not arouse to his name or my questions.  -BP remains soft but stable.  -Cr stable today with addition of PO lasix.  Review of systems complete and found to be negative unless listed above    Past Medical History:  Diagnosis Date   Dementia (HCC)    Diabetes mellitus without complication (HCC)    Hypertension     History reviewed. No pertinent surgical history.  Medications Prior to Admission  Medication Sig Dispense Refill Last Dose/Taking   atorvastatin  (LIPITOR) 40 MG tablet Take 40 mg by mouth daily.   07/04/2024   clopidogrel  (PLAVIX ) 75 MG tablet Take 75 mg by mouth daily.   07/04/2024   donepezil  (ARICEPT ) 10 MG tablet TAKE 1 TABLET BY MOUTH EVERY DAY 90 tablet 2 07/04/2024   ELIQUIS  5 MG TABS tablet Take 5 mg by mouth 2 (two) times daily.   07/04/2024   glimepiride  (AMARYL ) 2 MG tablet Take 1 tablet (2 mg total) by mouth daily at 6 (six) AM. 90 tablet 1 07/04/2024   lisinopril  (ZESTRIL ) 5 MG tablet Take 1 tablet (5 mg total) by  mouth daily. 90 tablet 3 07/04/2024   metFORMIN  (GLUCOPHAGE ) 1000 MG tablet TAKE 1 TABLET BY MOUTH TWICE A DAY 180 tablet 1 07/04/2024   metoprolol  succinate (TOPROL -XL) 25 MG 24 hr tablet Take 0.5 tablets by mouth daily.   07/04/2024   spironolactone  (ALDACTONE ) 25 MG tablet Take 12.5 mg by mouth daily.   07/04/2024   ascorbic acid (VITAMIN C) 1000 MG tablet Take 1,000 mg by mouth daily at 2 PM.      cyanocobalamin (VITAMIN B12) 1000 MCG tablet Take 1,000 mcg by mouth once a week.      Social History   Socioeconomic History   Marital status: Married    Spouse name: Not on file   Number of children: Not on file   Years of education: Not on file   Highest education level: Not on file  Occupational History   Not on file  Tobacco Use   Smoking status: Never   Smokeless tobacco: Never  Substance and Sexual Activity   Alcohol use: Never   Drug use: Never   Sexual activity: Not Currently  Other Topics Concern   Not on file  Social History Narrative   Not on file   Social Drivers of Health   Financial Resource Strain: Low Risk  (08/26/2021)   Overall Financial Resource Strain (CARDIA)    Difficulty of Paying Living Expenses: Not hard at all  Food Insecurity: No Food Insecurity (07/05/2024)   Hunger Vital Sign    Worried  About Running Out of Food in the Last Year: Never true    Ran Out of Food in the Last Year: Never true  Transportation Needs: No Transportation Needs (07/05/2024)   PRAPARE - Administrator, Civil Service (Medical): No    Lack of Transportation (Non-Medical): No  Physical Activity: Inactive (08/26/2021)   Exercise Vital Sign    Days of Exercise per Week: 0 days    Minutes of Exercise per Session: 0 min  Stress: No Stress Concern Present (08/26/2021)   [employer] of Occupational Health - Occupational Stress Questionnaire    Feeling of Stress : Only a little  Social Connections: Moderately Integrated (07/05/2024)   Social Connection and  Isolation Panel    Frequency of Communication with Friends and Family: Once a week    Frequency of Social Gatherings with Friends and Family: Once a week    Attends Religious Services: 1 to 4 times per year    Active Member of [employer] or Organizations: Yes    Attends Banker Meetings: 1 to 4 times per year    Marital Status: Married  Catering Manager Violence: Not At Risk (07/05/2024)   Humiliation, Afraid, Rape, and Kick questionnaire    Fear of Current or Ex-Partner: No    Emotionally Abused: No    Physically Abused: No    Sexually Abused: No    History reviewed. No pertinent family history.   Vitals:   07/15/24 1947 07/15/24 2336 07/16/24 0345 07/16/24 0750  BP: 124/76 (!) 120/98 95/62 95/65   Pulse: 94 95 78 71  Resp: 20 19 16 20   Temp: 97.6 F (36.4 C)  (!) 97.2 F (36.2 C)   TempSrc: Oral  Axillary   SpO2: 100% 100% 100% 100%  Weight:      Height:        PHYSICAL EXAM General: Acute on chronically ill appearing male, well nourished, in no acute distress. HEENT: Normocephalic and atraumatic. Neck: No JVD.  Lungs: Normal respiratory effort on 3L [place]. Clear bilaterally to auscultation. No wheezes, crackles, rhonchi.  Heart: HRRR. Normal S1 and S2 without gallops or murmurs.  Abdomen: Non-distended appearing.  Msk: Normal strength and tone for age. Extremities: Warm and well perfused. No clubbing, cyanosis. No edema.  Neuro: Alert and oriented X 3. Psych: Answers questions appropriately.   Labs: Basic Metabolic Panel: Recent Labs    07/15/24 0454 07/16/24 0125  NA 144 142  K 3.8 3.8  CL 105 104  CO2 26 28  GLUCOSE 147* 324*  BUN 31* 33*  CREATININE 1.14 1.13  CALCIUM  9.2 8.6*   Liver Function Tests: Recent Labs    07/14/24 0530  AST 38  ALT 42  ALKPHOS 165*  BILITOT 0.6  PROT 6.2*  ALBUMIN 3.5   No results for input(s): LIPASE, AMYLASE in the last 72 hours. CBC: Recent Labs    07/14/24 0530 07/15/24 0454  WBC 7.5 6.3  HGB 12.0*  12.6*  HCT 37.5* 39.8  MCV 92.1 93.4  PLT 183 181   Cardiac Enzymes: No results for input(s): CKTOTAL, CKMB, CKMBINDEX, TROPONINIHS in the last 72 hours. BNP: No results for input(s): BNP in the last 72 hours. D-Dimer: No results for input(s): DDIMER in the last 72 hours. Hemoglobin A1C: No results for input(s): HGBA1C in the last 72 hours.  Fasting Lipid Panel: No results for input(s): CHOL, HDL, LDLCALC, TRIG, CHOLHDL, LDLDIRECT in the last 72 hours. Thyroid  Function Tests: No results for input(s): TSH, T4TOTAL, T3FREE,  THYROIDAB in the last 72 hours.  Invalid input(s): FREET3 Anemia Panel: No results for input(s): VITAMINB12, FOLATE, FERRITIN, TIBC, IRON, RETICCTPCT in the last 72 hours.   Radiology: [facility] Chest Port 1 View Result Date: 07/14/2024 CLINICAL DATA:  Follow-up pleural effusion.  Hyperglycemia. EXAM: PORTABLE CHEST 1 VIEW COMPARISON:  07/12/2024 FINDINGS: No significant change in mild enlargement of the cardiac silhouette. Small right pleural effusion with improvement with decreased right basilar atelectasis. No left pleural effusion seen. Resolved left basilar atelectasis. No pneumothorax. Thoracic spine degenerative changes. IMPRESSION: 1. Small right pleural effusion with improvement and decreased right basilar atelectasis. 2. Resolved left pleural effusion and left basilar atelectasis. Electronically Signed   By: Elspeth Bathe M.D.   On: 07/14/2024 14:17   US  THORACENTESIS ASP PLEURAL SPACE W/IMG GUIDE Result Date: 07/13/2024 INDICATION: 83 year old male with history of dementia, generalized weakness, and chronic heart failure, now with bilateral pleural effusions, left greater than right. IR was requested for diagnostic and therapeutic left-sided thoracentesis. EXAM: ULTRASOUND GUIDED DIAGNOSTIC AND THERAPEUTIC THORACENTESIS MEDICATIONS: 4 cc of 1% lidocaine . COMPLICATIONS: None immediate. PROCEDURE: An ultrasound guided  thoracentesis was thoroughly discussed with the patient and questions answered. The benefits, risks, alternatives and complications were also discussed. The patient understands and wishes to proceed with the procedure. Written consent was obtained. Ultrasound was performed to localize and mark an adequate pocket of fluid in the left chest. The area was then prepped and draped in the normal sterile fashion. 1% Lidocaine  was used for local anesthesia. Under ultrasound guidance a 6 Fr Safe-T-Centesis catheter was introduced. Thoracentesis was performed. The catheter was removed and a dressing applied. FINDINGS: A total of approximately 400 mL of clear, straw-colored pleural fluid was removed. Samples were sent to the laboratory as requested by the clinical team. IMPRESSION: Successful ultrasound guided left thoracentesis yielding 400 mL of pleural fluid. Procedure performed by Carlin Griffon, PA-C Electronically Signed   By: Cordella Banner   On: 07/13/2024 11:27   DG Chest Port 1 View Result Date: 07/12/2024 CLINICAL DATA:  Status post left thoracentesis EXAM: PORTABLE CHEST 1 VIEW COMPARISON:  Earlier same day CT chest, chest radiograph dated 07/10/2024 FINDINGS: Mildly low lung volumes. Diffuse interlobular septal thickening. Persistent dense left retrocardiac opacity, likely atelectasis. Decreased small left pleural effusion. Skin fold along the lateral pleura. Similar layering moderate to large right pleural effusion. No pneumothorax. Similar enlarged cardiomediastinal silhouette. No acute osseous abnormality. IMPRESSION: 1. Decreased small left pleural effusion status post thoracentesis. No pneumothorax. 2. Similar layering moderate to large right pleural effusion. 3. Pulmonary edema. Electronically Signed   By: Limin  Xu M.D.   On: 07/12/2024 11:53   CT CHEST W CONTRAST Result Date: 07/12/2024 CLINICAL DATA:  Generalized weakness and hypoxia EXAM: CT CHEST WITH CONTRAST TECHNIQUE: Multidetector CT  imaging of the chest was performed during intravenous contrast administration. RADIATION DOSE REDUCTION: This exam was performed according to the departmental dose-optimization program which includes automated exposure control, adjustment of the mA and/or kV according to patient size and/or use of iterative reconstruction technique. CONTRAST:  80mL OMNIPAQUE  IOHEXOL  300 MG/ML  SOLN COMPARISON:  Chest radiograph dated 07/10/2024, CT chest dated 12/18/2023 FINDINGS: Cardiovascular: Mild multichamber cardiomegaly. No significant pericardial fluid/thickening. Great vessels are normal in course and caliber. No central pulmonary emboli. Coronary artery calcifications and aortic atherosclerosis. Mediastinum/Nodes: Imaged thyroid  gland without nodules meeting criteria for imaging follow-up by size. Normal esophagus. No pathologically enlarged axillary, supraclavicular, mediastinal, or hilar lymph nodes. Lungs/Pleura: The central airways are patent. Secretions  within the distal bronchus extending into the right bronchus intermedius. Mild diffuse bronchial wall thickening. Moderate diffuse interlobular septal thickening. Relaxation atelectasis of the bilateral lower lobes. No pneumothorax. Large bilateral pleural effusions. Upper abdomen: Partially imaged small volume ascites. Musculoskeletal: No acute or abnormal lytic or blastic osseous lesions. Multilevel degenerative changes of the thoracic spine. Diffuse body wall edema. IMPRESSION: 1. Pulmonary edema and large bilateral pleural effusions. 2. Mild multichamber cardiomegaly. 3. Partially imaged small volume ascites. 4.  Aortic Atherosclerosis (ICD10-I70.0). Electronically Signed   By: Limin  Xu M.D.   On: 07/12/2024 11:08   DG Chest 1 View Result Date: 07/10/2024 EXAM: 1 VIEW(S) XRAY OF THE CHEST 07/10/2024 03:54:00 AM COMPARISON: 07/04/2024 CLINICAL HISTORY: Dyspnea FINDINGS: LUNGS AND PLEURA: There are low lung volumes with hazy opacification of both lung bases and  obscuration of the hemidiaphragms. No pleural effusion. No pneumothorax. HEART AND MEDIASTINUM: There is calcification within the aortic arch. No acute abnormality of the cardiac and mediastinal silhouettes. BONES AND SOFT TISSUES: No acute osseous abnormality. IMPRESSION: 1. Low lung volumes with hazy opacification of both lung bases and obscuration of the hemidiaphragms, likely secondary to a combination of pleural effusions and atelectasis. Electronically signed by: Evalene Coho MD 07/10/2024 04:04 AM EST RP Workstation: [id number]   DG Ankle 2 Views Left Result Date: 07/06/2024 EXAM: 2 VIEW(S) XRAY OF THE LEFT ANKLE 07/06/2024 01:53:00 PM CLINICAL HISTORY: [id number] Pain [id number] Pain COMPARISON: None available. FINDINGS: BONES AND JOINTS: No acute fracture. Small plantar calcaneal spur. No joint dislocation. SOFT TISSUES: Vascular calcifications in the soft tissues. IMPRESSION: 1. No acute abnormality. Electronically signed by: Elsie Gravely MD 07/06/2024 01:55 PM EST RP Workstation: [id number]   DG Chest Portable 1 View Result Date: 07/04/2024 EXAM: 1 VIEW(S) XRAY OF THE CHEST 07/04/2024 09:34:00 PM COMPARISON: 12/19/2023 CLINICAL HISTORY: Dyspnea, AMS. FINDINGS: LUNGS AND PLEURA: Shallow inspiration. Lungs are clear. Pulmonary vascularity is normal. HEART AND MEDIASTINUM: Heart size is normal. Calcification of the aorta. BONES AND SOFT TISSUES: Degenerative changes in the spine. No acute osseous abnormality. IMPRESSION: 1. No acute cardiopulmonary process. Electronically signed by: Elsie Gravely MD 07/04/2024 09:51 PM EST RP Workstation: [id number]   CT Head Wo Contrast Result Date: 07/04/2024 CLINICAL DATA:  Altered level of consciousness, weakness EXAM: CT HEAD WITHOUT CONTRAST TECHNIQUE: Contiguous axial images were obtained from the base of the skull through the vertex without intravenous contrast. RADIATION DOSE REDUCTION: This exam was performed according to the departmental  dose-optimization program which includes automated exposure control, adjustment of the mA and/or kV according to patient size and/or use of iterative reconstruction technique. COMPARISON:  12/18/2023 FINDINGS: Brain: Stable chronic ischemic changes of the bilateral parietooccipital regions, periventricular white matter, and basal ganglia. No evidence of acute infarct or hemorrhage. Lateral ventricles and midline structures are unremarkable. No acute extra-axial fluid collections. No mass effect. Vascular: No hyperdense vessel or unexpected calcification. Skull: Normal. Negative for fracture or focal lesion. Sinuses/Orbits: No acute finding. Other: None. IMPRESSION: 1. Stable head CT, no acute intracranial process. Electronically Signed   By: Ozell Daring M.D.   On: 07/04/2024 21:37    ECHO 05/2024: SEVERE LEFT VENTRICULAR SYSTOLIC DYSFUNCTION WITH NO LVH  ESTIMATED EF: 20%, CALC EF(3D): 24%  DIASTOLIC FUNCTION CAN'T BE DETERMINED  MILD RIGHT VENTRICULAR SYSTOLIC DYSFUNCTION  VALVULAR REGURGITATION: TRIVIAL AR, SEVERE MR, TRIVIAL PR, TRIVIAL TR  ESTIMATED RVSP: 33 mmHg (Normal)  NO VALVULAR STENOSIS   TELEMETRY (personally reviewed): sinus rhythm rate 70s, PVCs (had episode of NSVT earlier this AM)  EKG (personally reviewed): NSR rate 92 bpm  Data reviewed by me 07/16/2024: last 24h vitals tele labs imaging I/O ED provider note, admission H&P, hospitalist progress notes, palliative care notes  Principal Problem:   Generalized weakness Active Problems:   Elevated troponin   Essential hypertension   Late onset Alzheimer's disease without behavioral disturbance (HCC)   AKI (acute kidney injury)   Hyperglycemia   IDA (iron deficiency anemia)   Frailty   Dementia (HCC)   Chronic HFrEF (heart failure with reduced ejection fraction) (HCC)   Chronic anticoagulation   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 Treated for possible UTI as well as acute heart failure during admission, had L thoracentesis yesterday. Cardiology was consulted for further evaluation.   # Acute on chronic HFrEF # Ischemic cardiomyopathy # Hx of LV thrombus # Vascular dementia Patient initially brought to the ED for elevated blood glucose, weakness. Concern for failure to thrive as he is frail appearing with poor PO intake, worsening confusion, and per wife is not very independent at home. Also found to have worsening heart function on recent echo. Has been treated for heart failure exacerbation during admission with CXR today relatively improved.  -Continue PO lasix  20 mg today. -Continue eliquis  5 mg twice daily.  -Continue atorvastatin  40 mg daily.  -Further additions to GDMT limited by BP. Defer starting at this time. -Mild and flat trops most consistent with demand/supply mismatch and not ACS. -Patient with gradually declining functional status and worsening heart function recently. He is at high risk of further decline. Palliative care is following and have had GOC discussions with wife.   Cardiology will sign off. Please haiku with questions or re-engage if needed. Follow up with Dr. Clista at [facility] in 1-2 weeks.   This patient's plan of care was discussed and created with Dr. Florencio and he is in agreement.  Signed: Danita Bloch, PA-C  07/16/2024, 10:47 AM [facility] Cardiology

## 2024-07-16 NOTE — Progress Notes (Signed)
 Physical Therapy Treatment Patient Details Name: Luke Chen MRN: [id number] DOB: Jul 15, 1941 Today's Date: 07/16/2024   History of Present Illness Luke Chen is an 83yoM who [facility], wife called EMS after pt felt weak and lie down on the floor. BG in 400s. 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    PT Comments  Pt seen for PT tx with pt received in bed, co-tx with OT, wife present in room. Pt required extra time & cuing to increase alertness. Attempted to assist pt with changing into a clean gown, when donning gown pt becomes combative, striking out, attempting to bite OT. Pt also declined wearing nasal cannula for a period of time, pt with increased labored breathing & able to place cannula back on pt. Pt left in bed with wife present. Will continue to follow pt acutely to progress mobility as able.    If plan is discharge home, recommend the following: Two people to help with bathing/dressing/bathroom;Two people to help with walking and/or transfers;Direct supervision/assist for medications management;Assistance with feeding;Direct supervision/assist for financial management;Assistance with cooking/housework;Supervision due to cognitive status;Help with stairs or ramp for entrance;Assist for transportation   Can travel by private vehicle     No  Equipment Recommendations  Wheelchair (measurements PT);Wheelchair cushion (measurements PT);Hospital bed;Hoyer lift    Recommendations for Other Services Rehab consult     Precautions / Restrictions Precautions Precautions: Fall Recall of Precautions/Restrictions: Impaired Restrictions Weight Bearing Restrictions Per Provider Order: No     Mobility  Bed Mobility                    Transfers                        Ambulation/Gait                   Stairs             Wheelchair Mobility     Tilt Bed    Modified Rankin (Stroke Patients  Only)       Balance                                            Communication Communication Communication: Impaired Factors Affecting Communication: Difficulty expressing self;Hearing impaired  Cognition Arousal: Lethargic Behavior During Therapy: Agitated, Restless   PT - Cognitive impairments: History of cognitive impairments                         Following commands: Impaired Following commands impaired: Follows one step commands inconsistently    Cueing Cueing Techniques: Verbal cues, Gestural cues, Tactile cues, Visual cues  Exercises      General Comments        Pertinent Vitals/Pain Pain Assessment Pain Assessment: PAINAD Breathing: occasional labored breathing, short period of hyperventilation Negative Vocalization: occasional moan/groan, low speech, negative/disapproving quality Facial Expression: smiling or inexpressive Body Language: rigid, fists clenched, knees up, pushing/pulling away, strikes out Consolability: unable to console, distract or reassure PAINAD Score: 6 Pain Location: generalized    Home Living                          Prior Function  PT Goals (current goals can now be found in the care plan section) Acute Rehab PT Goals Patient Stated Goal: pt's family wants pt to be able to DC home. PT Goal Formulation: Patient unable to participate in goal setting Progress towards PT goals: Not progressing toward goals - comment (limited 2/2 agitation today)    Frequency    Min 1X/week      PT Plan      Co-evaluation PT/OT/SLP Co-Evaluation/Treatment: Yes Reason for Co-Treatment: Complexity of the patient's impairments (multi-system involvement);For patient/therapist safety;To address functional/ADL transfers;Necessary to address cognition/behavior during functional activity PT goals addressed during session: Mobility/safety with mobility OT goals addressed during session: ADL's and  self-care      AM-PAC PT 6 Clicks Mobility   Outcome Measure  Help needed turning from your back to your side while in a flat bed without using bedrails?: Total Help needed moving from lying on your back to sitting on the side of a flat bed without using bedrails?: Total Help needed moving to and from a bed to a chair (including a wheelchair)?: Total Help needed standing up from a chair using your arms (e.g., wheelchair or bedside chair)?: Total Help needed to walk in hospital room?: Total Help needed climbing 3-5 steps with a railing? : Total 6 Click Score: 6    End of Session Equipment Utilized During Treatment: Oxygen  Activity Tolerance: Treatment limited secondary to agitation Patient left: with call bell/phone within reach;in bed;with nursing/sitter in room;with family/visitor present Nurse Communication:  (O2) PT Visit Diagnosis: Other abnormalities of gait and mobility (R26.89);Muscle weakness (generalized) (M62.81);Difficulty in walking, not elsewhere classified (R26.2);Unsteadiness on feet (R26.81)     Time: 1350-1404 PT Time Calculation (min) (ACUTE ONLY): 14 min  Charges:      PT General Charges $$ ACUTE PT VISIT: 1 Visit                     Richerd Pinal, PT, DPT 07/16/24, 2:37 PM   Richerd CHRISTELLA Pinal 07/16/2024, 2:35 PM

## 2024-07-17 DIAGNOSIS — R531 Weakness: Secondary | ICD-10-CM | POA: Diagnosis not present

## 2024-07-17 LAB — GLUCOSE, CAPILLARY
Glucose-Capillary: 151 mg/dL — ABNORMAL HIGH (ref 70–99)
Glucose-Capillary: 188 mg/dL — ABNORMAL HIGH (ref 70–99)
Glucose-Capillary: 204 mg/dL — ABNORMAL HIGH (ref 70–99)
Glucose-Capillary: 257 mg/dL — ABNORMAL HIGH (ref 70–99)
Glucose-Capillary: 308 mg/dL — ABNORMAL HIGH (ref 70–99)
Glucose-Capillary: 382 mg/dL — ABNORMAL HIGH (ref 70–99)

## 2024-07-17 LAB — BASIC METABOLIC PANEL WITH GFR
Anion gap: 11 (ref 5–15)
BUN: 28 mg/dL — ABNORMAL HIGH (ref 8–23)
CO2: 25 mmol/L (ref 22–32)
Calcium: 8.7 mg/dL — ABNORMAL LOW (ref 8.9–10.3)
Chloride: 106 mmol/L (ref 98–111)
Creatinine, Ser: 1 mg/dL (ref 0.61–1.24)
GFR, Estimated: >60 mL/min (ref >60)
Glucose, Bld: 212 mg/dL — ABNORMAL HIGH (ref 70–99)
Potassium: 4 mmol/L (ref 3.5–5.1)
Sodium: 142 mmol/L (ref 135–145)

## 2024-07-17 MED ORDER — INSULIN ASPART 100 UNIT/ML IJ SOLN
0.0000 [IU] | INTRAMUSCULAR | Status: DC
  Administered 2024-07-17: 5 [IU] via SUBCUTANEOUS
  Administered 2024-07-17: 9 [IU] via SUBCUTANEOUS
  Administered 2024-07-17: 7 [IU] via SUBCUTANEOUS
  Filled 2024-07-17: qty 7
  Filled 2024-07-17: qty 9
  Filled 2024-07-17: qty 5

## 2024-07-17 MED ORDER — DEXTROSE 50 % IV SOLN: 1.0000 | Freq: Once | INTRAVENOUS | Status: AC

## 2024-07-17 MED ORDER — ACETAMINOPHEN 325 MG PO TABS: 650.0000 mg | ORAL_TABLET | Freq: Three times a day (TID) | ORAL | Status: AC

## 2024-07-17 MED ORDER — ENSURE PLUS HIGH PROTEIN PO LIQD: 237.0000 mL | Freq: Two times a day (BID) | ORAL | Status: AC

## 2024-07-17 MED ORDER — IPRATROPIUM-ALBUTEROL 0.5-2.5 (3) MG/3ML IN SOLN: 3.0000 mL | Freq: Four times a day (QID) | RESPIRATORY_TRACT | Status: AC | PRN

## 2024-07-17 MED ORDER — INSULIN ASPART 100 UNIT/ML IJ SOLN: 0.0000 [IU] | INTRAMUSCULAR | Status: AC

## 2024-07-17 MED ORDER — ACETAMINOPHEN 325 MG PO TABS: 650.0000 mg | ORAL_TABLET | Freq: Four times a day (QID) | ORAL | Status: AC | PRN

## 2024-07-17 MED ORDER — INSULIN ASPART 100 UNIT/ML IJ SOLN: 0.0000 [IU] | INTRAMUSCULAR | Status: DC

## 2024-07-17 MED ORDER — GLUCOSE 40 % PO GEL: 1.0000 | Freq: Once | ORAL | Status: AC | PRN

## 2024-07-17 MED ORDER — FUROSEMIDE 20 MG PO TABS: 20.0000 mg | ORAL_TABLET | Freq: Every day | ORAL | Status: AC

## 2024-07-17 NOTE — Plan of Care (Signed)
  Problem: Education: Goal: Knowledge of General Education information will improve Description: Including pain rating scale, medication(s)/side effects and non-pharmacologic comfort measures Outcome: Progressing   Problem: Health Behavior/Discharge Planning: Goal: Ability to manage health-related needs will improve Outcome: Progressing   Problem: Clinical Measurements: Goal: Ability to maintain clinical measurements within normal limits will improve Outcome: Progressing   Problem: Nutrition: Goal: Adequate nutrition will be maintained Outcome: Progressing   Problem: Coping: Goal: Level of anxiety will decrease Outcome: Progressing   Problem: Pain Managment: Goal: General experience of comfort will improve and/or be controlled Outcome: Progressing   Problem: Safety: Goal: Ability to remain free from injury will improve Outcome: Progressing   Problem: Skin Integrity: Goal: Risk for impaired skin integrity will decrease Outcome: Progressing

## 2024-07-17 NOTE — Discharge Summary (Signed)
 Physician Discharge Summary   Patient: Luke Chen MRN: [id number] DOB: December 12, 1940  Admit date:     07/04/2024  Discharge date: 07/17/24  Discharge Physician: Duffy Al-Sultani   PCP: HULDA., Sheralyn Flock, MD   Recommendations at discharge:   Recheck BMP within 1 week or sooner if hypotension, reduced urine output, or changes in mental status Continue as needed oxygen  to maintain SpO2 greater than 90% Follow up with cardiology, Dr. Brutus, as scheduled Follow-up with PCP within 1 to 2 weeks of discharge from SNF  Discharge Diagnoses: Principal Problem:   Generalized weakness Active Problems:   Hyperglycemia   Elevated troponin   AKI (acute kidney injury)   Chronic HFrEF (heart failure with reduced ejection fraction) (HCC)   IDA (iron deficiency anemia)   Chronic anticoagulation   Essential hypertension   Late onset Alzheimer's disease without behavioral disturbance (HCC)   Frailty   Dementia (HCC)   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 [facility])   Hospital Course:  The patient is a 83 year old male with PMHx of dementia, insulin -dependent diabetes, HTN, prior stroke, chronic HFrEF (EF 20%), 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  EMS was called by wife due to increasing difficulty caring for him.  In the ED, he appeared to frail with borderline hypotension.  Labs showed hyperglycemia and anion gap metabolic acidosis (glucose 294-417, anion gap 19, bicarb 18, beta hydroxybutyrate 0.39), creatinine elevated from baseline, leukocytosis, and a mildly elevated but downtrending troponin consistent with demand ischemia.  Imaging was nonacute.  He was started on insulin  infusion with fluids and electrolyte replacement for presumed DKA.  DKA resolved with therapy, and AKI improved with cautious hydration.   UA suggested a possible UTI; urine culture later returned with mixed species.  He completed a full course of ceftriaxone .  Despite correction of metabolic abnormalities, he remained profoundly weak and minimally interactive.  His functional status was attributed to advanced vascular dementia, frailty, poor nutritional intake, and overall medical complexity.  Behavioral symptoms improved after an early single dose of Haldol ; no further antipsychotics required.  PT/OT evaluated and recommended SNF placement.  Palliative care was involved for repeated goals of care discussions with the patient's wife, who ultimately agreed to SNF discharge given his global decline and high care needs.  Course was complicated by acute hypoxic respiratory failure due to a pleural effusion.  CT chest confirmed effusion, and he underwent thoracentesis on 07/12/2024 with removal of 400 cc, resulting in improved oxygenation.  He had intermittent volume overload and treated with low-dose Lasix.  Cardiology evaluated and recommended continuing oral Lasix; GDMT remains limited by persistent soft blood pressures.  Home lisinopril , metoprolol  succinate, and spironolactone  were held as a result.  Glycemic management remains challenging due to poor p.o. intake.  He required intermittent correction for hyperglycemia and subsequently developed hypoglycemia after receiving 2 large NovoLog  correction doses.  Sliding scale was adjusted to more conservative regimen.  Of note, his wife remained insistent that he did not receive any correctional insulin  unless his CBGs were >200.  Sliding scale was adjusted accordingly.  Home metformin  and Amaryl  were held at discharge.  At the time of discharge, the patient's DKA had resolved, AKI returned to baseline, oxygenation improved postthoracentesis but the patient continues to require 3 L nasal cannula.  Otherwise he remained hemodynamically stable without evidence of active infection.  He  was deemed  medically stable for discharge to SNF for ongoing supportive and rehabilitative management consistent with established goals of care.       Consultants: Cardiology, palliative, IR Procedures performed: Thoracentesis, left, 07/12/2024, removal 400 cc Disposition: Skilled nursing facility Diet recommendation:  Diet Orders (From admission, onward)     Start     Ordered   07/16/24 1112  Diet Carb Modified Fluid consistency: Thin; Room service appropriate? Yes  Diet effective now       Question Answer Comment  Diet-HS Snack? Nothing   Calorie Level Medium 1600-2000   Fluid consistency: Thin   Room service appropriate? Yes      07/16/24 1112            DISCHARGE MEDICATION: Allergies as of 07/17/2024       Reactions   Gramineae Pollens Other (See Comments)   12/14/21 Patient's wife states don't know if it's his medications or allergies but he gets a runny nose, Samuel Cahill MD made aware   Other Other (See Comments)   12/14/21 Patient's wife states don't know if it's his medications or allergies but he gets a runny nose, Samuel Cahill MD made aware        Medication List     PAUSE taking these medications    lisinopril  5 MG tablet Wait to take this until your doctor or other care provider tells you to start again. Commonly known as: ZESTRIL  Take 1 tablet (5 mg total) by mouth daily.   metFORMIN  1000 MG tablet Wait to take this until your doctor or other care provider tells you to start again. Commonly known as: GLUCOPHAGE  TAKE 1 TABLET BY MOUTH TWICE A DAY   metoprolol  succinate 25 MG 24 hr tablet Wait to take this until your doctor or other care provider tells you to start again. Commonly known as: TOPROL -XL Take 0.5 tablets by mouth daily.   spironolactone  25 MG tablet Wait to take this until your doctor or other care provider tells you to start again. Commonly known as: ALDACTONE  Take 12.5 mg by mouth daily.       STOP taking these medications     glimepiride  2 MG tablet Commonly known as: AMARYL        TAKE these medications    acetaminophen  325 MG tablet Commonly known as: TYLENOL  Take 2 tablets (650 mg total) by mouth every 6 (six) hours as needed for mild pain (pain score 1-3) or fever (or Fever >/= 101).   acetaminophen  325 MG tablet Commonly known as: TYLENOL  Take 2 tablets (650 mg total) by mouth 3 (three) times daily.   ascorbic acid 1000 MG tablet Commonly known as: VITAMIN C Take 1,000 mg by mouth daily at 2 PM.   atorvastatin  40 MG tablet Commonly known as: LIPITOR Take 40 mg by mouth daily.   clopidogrel  75 MG tablet Commonly known as: PLAVIX  Take 75 mg by mouth daily.   cyanocobalamin 1000 MCG tablet Commonly known as: VITAMIN B12 Take 1,000 mcg by mouth once a week.   dextrose  40 % Gel Commonly known as: GLUTOSE Take 31 g by mouth once as needed for low blood sugar.   dextrose  50 % solution Inject 50 mLs into the vein once for 1 dose.   donepezil  10 MG tablet Commonly known as: ARICEPT  TAKE 1 TABLET BY MOUTH EVERY DAY   Eliquis  5 MG Tabs tablet Generic drug: apixaban  Take 5 mg by mouth 2 (two) times daily.   feeding supplement Liqd Take  237 mLs by mouth 2 (two) times daily between meals.   furosemide 20 MG tablet Commonly known as: LASIX Take 1 tablet (20 mg total) by mouth daily. Start taking on: July 18, 2024   insulin  aspart 100 UNIT/ML injection Commonly known as: novoLOG  Inject 0-9 Units into the skin every 4 (four) hours.   ipratropium-albuterol  0.5-2.5 (3) MG/3ML Soln Commonly known as: DUONEB Take 3 mLs by nebulization every 6 (six) hours as needed.        Contact information for follow-up providers     Brutus Odor, MD. Go in 1 week(s).   Specialty: Cardiology Contact information: [address] MEDICINE [place] [address] [phone]              Contact information for after-discharge care     Destination     [facility] [place], [place]. [place]    Service: Skilled Nursing Contact information: [address] [place]  [address] [phone]                     Discharge Exam: Filed Weights   07/04/24 2347 07/05/24 2000 07/06/24 0326  Weight: 54.4 kg 63.9 kg 63.2 kg   Blood pressure 104/72, pulse 79, temperature (!) 97.4 F (36.3 C), temperature source Oral, resp. rate 17, height 5' 9 (1.753 m), weight 63.2 kg, SpO2 100%.   Gen: NAD, alert, oriented to person only, not place or time, able to recognize wife HEENT: NCAT, EOMI Neck: Supple, no JVD CV: RRR, no murmurs Resp: normal WOB, CTAB Abd: Soft, NTND, no guarding Ext: No LE edema Skin: Warm, dry Neuro: No focal deficits Psych: Calm, cooperative   Condition at discharge: fair  The results of significant diagnostics from this hospitalization (including imaging, microbiology, ancillary and laboratory) are listed below for reference.   Imaging Studies: DG Chest Port 1 View Result Date: 07/14/2024 CLINICAL DATA:  Follow-up pleural effusion.  Hyperglycemia. EXAM: PORTABLE CHEST 1 VIEW COMPARISON:  07/12/2024 FINDINGS: No significant change in mild enlargement of the cardiac silhouette. Small right pleural effusion with improvement with decreased right basilar atelectasis. No left pleural effusion seen. Resolved left basilar atelectasis. No pneumothorax. Thoracic spine degenerative changes. IMPRESSION: 1. Small right pleural effusion with improvement and decreased right basilar atelectasis. 2. Resolved left pleural effusion and left basilar atelectasis. Electronically Signed   By: Elspeth Bathe M.D.   On: 07/14/2024 14:17   US  THORACENTESIS ASP PLEURAL SPACE W/IMG GUIDE Result Date: 07/13/2024 INDICATION: 83 year old male with history of dementia, generalized weakness, and chronic heart failure, now with bilateral pleural effusions, left greater than right. IR was requested for diagnostic and therapeutic left-sided thoracentesis. EXAM: ULTRASOUND GUIDED  DIAGNOSTIC AND THERAPEUTIC THORACENTESIS MEDICATIONS: 4 cc of 1% lidocaine. COMPLICATIONS: None immediate. PROCEDURE: An ultrasound guided thoracentesis was thoroughly discussed with the patient and questions answered. The benefits, risks, alternatives and complications were also discussed. The patient understands and wishes to proceed with the procedure. Written consent was obtained. Ultrasound was performed to localize and mark an adequate pocket of fluid in the left chest. The area was then prepped and draped in the normal sterile fashion. 1% Lidocaine was used for local anesthesia. Under ultrasound guidance a 6 Fr Safe-T-Centesis catheter was introduced. Thoracentesis was performed. The catheter was removed and a dressing applied. FINDINGS: A total of approximately 400 mL of clear, straw-colored pleural fluid was removed. Samples were sent to the laboratory as requested by the clinical team. IMPRESSION: Successful ultrasound guided left thoracentesis yielding 400 mL of pleural  fluid. Procedure performed by Carlin Griffon, PA-C Electronically Signed   By: Cordella Banner   On: 07/13/2024 11:27   DG Chest Port 1 View Result Date: 07/12/2024 CLINICAL DATA:  Status post left thoracentesis EXAM: PORTABLE CHEST 1 VIEW COMPARISON:  Earlier same day CT chest, chest radiograph dated 07/10/2024 FINDINGS: Mildly low lung volumes. Diffuse interlobular septal thickening. Persistent dense left retrocardiac opacity, likely atelectasis. Decreased small left pleural effusion. Skin fold along the lateral pleura. Similar layering moderate to large right pleural effusion. No pneumothorax. Similar enlarged cardiomediastinal silhouette. No acute osseous abnormality. IMPRESSION: 1. Decreased small left pleural effusion status post thoracentesis. No pneumothorax. 2. Similar layering moderate to large right pleural effusion. 3. Pulmonary edema. Electronically Signed   By: Limin  Xu M.D.   On: 07/12/2024 11:53   CT CHEST W  CONTRAST Result Date: 07/12/2024 CLINICAL DATA:  Generalized weakness and hypoxia EXAM: CT CHEST WITH CONTRAST TECHNIQUE: Multidetector CT imaging of the chest was performed during intravenous contrast administration. RADIATION DOSE REDUCTION: This exam was performed according to the departmental dose-optimization program which includes automated exposure control, adjustment of the mA and/or kV according to patient size and/or use of iterative reconstruction technique. CONTRAST:  80mL OMNIPAQUE  IOHEXOL  300 MG/ML  SOLN COMPARISON:  Chest radiograph dated 07/10/2024, CT chest dated 12/18/2023 FINDINGS: Cardiovascular: Mild multichamber cardiomegaly. No significant pericardial fluid/thickening. Great vessels are normal in course and caliber. No central pulmonary emboli. Coronary artery calcifications and aortic atherosclerosis. Mediastinum/Nodes: Imaged thyroid  gland without nodules meeting criteria for imaging follow-up by size. Normal esophagus. No pathologically enlarged axillary, supraclavicular, mediastinal, or hilar lymph nodes. Lungs/Pleura: The central airways are patent. Secretions within the distal bronchus extending into the right bronchus intermedius. Mild diffuse bronchial wall thickening. Moderate diffuse interlobular septal thickening. Relaxation atelectasis of the bilateral lower lobes. No pneumothorax. Large bilateral pleural effusions. Upper abdomen: Partially imaged small volume ascites. Musculoskeletal: No acute or abnormal lytic or blastic osseous lesions. Multilevel degenerative changes of the thoracic spine. Diffuse body wall edema. IMPRESSION: 1. Pulmonary edema and large bilateral pleural effusions. 2. Mild multichamber cardiomegaly. 3. Partially imaged small volume ascites. 4.  Aortic Atherosclerosis (ICD10-I70.0). Electronically Signed   By: Limin  Xu M.D.   On: 07/12/2024 11:08   DG Chest 1 View Result Date: 07/10/2024 EXAM: 1 VIEW(S) XRAY OF THE CHEST 07/10/2024 03:54:00 AM  COMPARISON: 07/04/2024 CLINICAL HISTORY: Dyspnea FINDINGS: LUNGS AND PLEURA: There are low lung volumes with hazy opacification of both lung bases and obscuration of the hemidiaphragms. No pleural effusion. No pneumothorax. HEART AND MEDIASTINUM: There is calcification within the aortic arch. No acute abnormality of the cardiac and mediastinal silhouettes. BONES AND SOFT TISSUES: No acute osseous abnormality. IMPRESSION: 1. Low lung volumes with hazy opacification of both lung bases and obscuration of the hemidiaphragms, likely secondary to a combination of pleural effusions and atelectasis. Electronically signed by: Evalene Coho MD 07/10/2024 04:04 AM EST RP Workstation: [id number]   DG Ankle 2 Views Left Result Date: 07/06/2024 EXAM: 2 VIEW(S) XRAY OF THE LEFT ANKLE 07/06/2024 01:53:00 PM CLINICAL HISTORY: [id number] Pain [id number] Pain COMPARISON: None available. FINDINGS: BONES AND JOINTS: No acute fracture. Small plantar calcaneal spur. No joint dislocation. SOFT TISSUES: Vascular calcifications in the soft tissues. IMPRESSION: 1. No acute abnormality. Electronically signed by: Elsie Gravely MD 07/06/2024 01:55 PM EST RP Workstation: [id number]   DG Chest Portable 1 View Result Date: 07/04/2024 EXAM: 1 VIEW(S) XRAY OF THE CHEST 07/04/2024 09:34:00 PM COMPARISON: 12/19/2023 CLINICAL HISTORY: Dyspnea, AMS. FINDINGS: LUNGS AND PLEURA:  Shallow inspiration. Lungs are clear. Pulmonary vascularity is normal. HEART AND MEDIASTINUM: Heart size is normal. Calcification of the aorta. BONES AND SOFT TISSUES: Degenerative changes in the spine. No acute osseous abnormality. IMPRESSION: 1. No acute cardiopulmonary process. Electronically signed by: Elsie Gravely MD 07/04/2024 09:51 PM EST RP Workstation: [id number]   CT Head Wo Contrast Result Date: 07/04/2024 CLINICAL DATA:  Altered level of consciousness, weakness EXAM: CT HEAD WITHOUT CONTRAST TECHNIQUE: Contiguous axial images were obtained from the base of  the skull through the vertex without intravenous contrast. RADIATION DOSE REDUCTION: This exam was performed according to the departmental dose-optimization program which includes automated exposure control, adjustment of the mA and/or kV according to patient size and/or use of iterative reconstruction technique. COMPARISON:  12/18/2023 FINDINGS: Brain: Stable chronic ischemic changes of the bilateral parietooccipital regions, periventricular white matter, and basal ganglia. No evidence of acute infarct or hemorrhage. Lateral ventricles and midline structures are unremarkable. No acute extra-axial fluid collections. No mass effect. Vascular: No hyperdense vessel or unexpected calcification. Skull: Normal. Negative for fracture or focal lesion. Sinuses/Orbits: No acute finding. Other: None. IMPRESSION: 1. Stable head CT, no acute intracranial process. Electronically Signed   By: Ozell Daring M.D.   On: 07/04/2024 21:37    Microbiology: Results for orders placed or performed during the hospital encounter of 07/04/24  MRSA Next Gen by PCR, Nasal     Status: None   Collection Time: 07/05/24  2:00 AM   Specimen: Nasal Mucosa; Nasal Swab  Result Value Ref Range Status   MRSA by PCR Next Gen NOT DETECTED NOT DETECTED Final    Comment: (NOTE) The GeneXpert MRSA Assay (FDA approved for NASAL specimens only), is one component of a comprehensive MRSA colonization surveillance program. It is not intended to diagnose MRSA infection nor to guide or monitor treatment for MRSA infections. Test performance is not FDA approved in patients less than 85 years old. Performed at [facility], [address], [place], [place] [address]   Urine Culture     Status: Abnormal   Collection Time: 07/05/24  4:41 AM   Specimen: Urine, Random  Result Value Ref Range Status   Specimen Description   Final    URINE, RANDOM Performed at [facility], [address] [place], [place] [address]    Special  Requests   Final    NONE Reflexed from [id number] Performed at [facility], [address] [place], [place] [address]    Culture MULTIPLE SPECIES PRESENT, SUGGEST RECOLLECTION (A)  Final   Report Status 07/06/2024 FINAL  Final  Body fluid culture w Gram Stain     Status: None   Collection Time: 07/12/24 11:09 AM   Specimen: PATH Cytology Pleural fluid  Result Value Ref Range Status   Specimen Description   Final    PLEURAL Performed at [facility], [address], [place], [place] [address]    Special Requests   Final    NONE Performed at [facility], [address], [place], [place] [address]    Gram Stain NO WBC SEEN NO ORGANISMS SEEN   Final   Culture   Final    NO GROWTH 3 DAYS Performed at [facility], [address] [place], [place] [address]    Report Status 07/16/2024 FINAL  Final    Labs: CBC: Recent Labs  Lab 07/11/24 1010 07/12/24 1011 07/13/24 0929 07/14/24 0530 07/15/24 0454  WBC 7.1 8.4 9.5 7.5 6.3  NEUTROABS  --  5.5  --   --   --   HGB 10.6* 11.9* 12.6* 12.0* 12.6*  HCT 33.5* 37.2* 39.3 37.5* 39.8  MCV 92.5 93.2 92.9 92.1 93.4  PLT 191 185 198 183 181   Basic Metabolic Panel: Recent Labs  Lab 07/13/24 0929 07/14/24 0530 07/15/24 0454 07/16/24 0125 07/17/24 0722  NA 141 144 144 142 142  K 4.6 3.8 3.8 3.8 4.0  CL 104 106 105 104 106  CO2 23 24 26 28 25   GLUCOSE 306* 176* 147* 324* 212*  BUN 36* 32* 31* 33* 28*  CREATININE 1.30* 1.15 1.14 1.13 1.00  CALCIUM  9.5 9.2 9.2 8.6* 8.7*   Liver Function Tests: Recent Labs  Lab 07/12/24 1011 07/14/24 0530  AST 47* 38  ALT 58* 42  ALKPHOS 195* 165*  BILITOT 0.6 0.6  PROT 6.4* 6.2*  ALBUMIN 3.7 3.5   CBG: Recent Labs  Lab 07/16/24 2043 07/17/24 0042 07/17/24 0501 07/17/24 0738 07/17/24 1157  GLUCAP 340* 151* 188* 204* 257*    Discharge time spent: Time Coordinating Discharge: I spent a total of 35 minutes engaged in face-to-face discussion  with the patient and/or caregivers regarding the patient's care, assessment, plan, and discharge disposition. Over 50% of this time was dedicated to counseling the patient on the risks and benefits of treatment options and the discharge plan, as well as coordinating post-discharge care.   Signed: Rickardo Brinegar Al-Sultani, MD [facility] 07/17/2024

## 2024-07-17 NOTE — Progress Notes (Signed)
 Called and notified Meade at [facility] that patient was on his way to the facility. And also that he was given 7 units of novolog  , eliquis  and tylenol  at 2010.

## 2024-07-17 NOTE — Inpatient Diabetes Management (Signed)
 Inpatient Diabetes Program Recommendations  AACE/ADA: New Consensus Statement on Inpatient Glycemic Control (2015)  Target Ranges:  Prepandial:   less than 140 mg/dL      [facility] postprandial:   less than 180 mg/dL (1-2 hours)      Critically ill patients:  140 - 180 mg/dL   Lab Results  Component Value Date   GLUCAP 204 (H) 07/17/2024   HGBA1C 9.2 (H) 07/13/2024    Review of Glycemic Control  Diabetes history: DM2 Outpatient Diabetes medications: Amaryl  2 mg QAM, Metformin  1000 mg BID Current orders for Inpatient glycemic control: Novolog  0-15 units Q4H  Inpatient Diabetes Program Recommendations:   Spoke with wife and patient @ bedside. Reviewed with wife Novolog  correction scale was decreased and reviewed ranges of coverage. Wife remains adamant that she does not want patient to receive insulin  until CBG is >200.  May want to decrease Novolog  scale further to 0-9 q 4 hrs. so pt. will receive some insulin . thank you.  Thank you, Cabria Micalizzi E. Ardys Hataway, RN, MSN, CNS, CDCES  Diabetes Coordinator Inpatient Glycemic Control Team Team Pager [phone] (8am-5pm) 07/17/2024 8:47 AM

## 2024-09-14 DEATH — deceased
